# Patient Record
Sex: Male | Born: 1975 | Race: Black or African American | Hispanic: No | Marital: Single | Smoking: Never smoker
Health system: Southern US, Community
[De-identification: ages and names within clinical notes are randomized; demographics above are authoritative.]

## PROBLEM LIST (undated history)

## (undated) DIAGNOSIS — E119 Type 2 diabetes mellitus without complications: Secondary | ICD-10-CM

## (undated) DIAGNOSIS — E785 Hyperlipidemia, unspecified: Secondary | ICD-10-CM

## (undated) DIAGNOSIS — N289 Disorder of kidney and ureter, unspecified: Secondary | ICD-10-CM

## (undated) DIAGNOSIS — I10 Essential (primary) hypertension: Secondary | ICD-10-CM

---

## 2019-04-01 ENCOUNTER — Emergency Department: Payer: Self-pay

## 2019-04-01 ENCOUNTER — Inpatient Hospital Stay
Admission: EM | Admit: 2019-04-01 | Discharge: 2019-04-15 | DRG: 870 | Disposition: A | Discharge status: Home Health | Payer: Self-pay | Attending: Internal Medicine | Admitting: Internal Medicine

## 2019-04-01 ENCOUNTER — Inpatient Hospital Stay: Payer: Self-pay

## 2019-04-01 ENCOUNTER — Other Ambulatory Visit: Payer: Self-pay

## 2019-04-01 ENCOUNTER — Other Ambulatory Visit: Payer: Medicaid Other

## 2019-04-01 DIAGNOSIS — E1110 Type 2 diabetes mellitus with ketoacidosis without coma: Secondary | ICD-10-CM | POA: Diagnosis present

## 2019-04-01 DIAGNOSIS — E1122 Type 2 diabetes mellitus with diabetic chronic kidney disease: Secondary | ICD-10-CM | POA: Diagnosis present

## 2019-04-01 DIAGNOSIS — J189 Pneumonia, unspecified organism: Secondary | ICD-10-CM

## 2019-04-01 DIAGNOSIS — Z7984 Long term (current) use of oral hypoglycemic drugs: Secondary | ICD-10-CM

## 2019-04-01 DIAGNOSIS — J9602 Acute respiratory failure with hypercapnia: Secondary | ICD-10-CM | POA: Diagnosis present

## 2019-04-01 DIAGNOSIS — I120 Hypertensive chronic kidney disease with stage 5 chronic kidney disease or end stage renal disease: Secondary | ICD-10-CM | POA: Diagnosis present

## 2019-04-01 DIAGNOSIS — Z79899 Other long term (current) drug therapy: Secondary | ICD-10-CM

## 2019-04-01 DIAGNOSIS — Z992 Dependence on renal dialysis: Secondary | ICD-10-CM

## 2019-04-01 DIAGNOSIS — R739 Hyperglycemia, unspecified: Secondary | ICD-10-CM

## 2019-04-01 DIAGNOSIS — N179 Acute kidney failure, unspecified: Secondary | ICD-10-CM

## 2019-04-01 DIAGNOSIS — J690 Pneumonitis due to inhalation of food and vomit: Secondary | ICD-10-CM

## 2019-04-01 DIAGNOSIS — Z7982 Long term (current) use of aspirin: Secondary | ICD-10-CM

## 2019-04-01 DIAGNOSIS — T80212A Local infection due to central venous catheter, initial encounter: Secondary | ICD-10-CM

## 2019-04-01 DIAGNOSIS — J9600 Acute respiratory failure, unspecified whether with hypoxia or hypercapnia: Secondary | ICD-10-CM

## 2019-04-01 DIAGNOSIS — E871 Hypo-osmolality and hyponatremia: Secondary | ICD-10-CM | POA: Diagnosis present

## 2019-04-01 DIAGNOSIS — E869 Volume depletion, unspecified: Secondary | ICD-10-CM | POA: Diagnosis present

## 2019-04-01 DIAGNOSIS — K5900 Constipation, unspecified: Secondary | ICD-10-CM | POA: Diagnosis present

## 2019-04-01 DIAGNOSIS — Z1159 Encounter for screening for other viral diseases: Secondary | ICD-10-CM

## 2019-04-01 DIAGNOSIS — N186 End stage renal disease: Secondary | ICD-10-CM

## 2019-04-01 DIAGNOSIS — R570 Cardiogenic shock: Secondary | ICD-10-CM | POA: Diagnosis present

## 2019-04-01 DIAGNOSIS — B9562 Methicillin resistant Staphylococcus aureus infection as the cause of diseases classified elsewhere: Secondary | ICD-10-CM | POA: Diagnosis present

## 2019-04-01 DIAGNOSIS — R4189 Other symptoms and signs involving cognitive functions and awareness: Secondary | ICD-10-CM

## 2019-04-01 DIAGNOSIS — E785 Hyperlipidemia, unspecified: Secondary | ICD-10-CM | POA: Diagnosis present

## 2019-04-01 DIAGNOSIS — Z9911 Dependence on respirator [ventilator] status: Secondary | ICD-10-CM

## 2019-04-01 DIAGNOSIS — R6521 Severe sepsis with septic shock: Secondary | ICD-10-CM | POA: Diagnosis present

## 2019-04-01 DIAGNOSIS — A419 Sepsis, unspecified organism: Principal | ICD-10-CM

## 2019-04-01 DIAGNOSIS — Z905 Acquired absence of kidney: Secondary | ICD-10-CM

## 2019-04-01 DIAGNOSIS — N170 Acute kidney failure with tubular necrosis: Secondary | ICD-10-CM | POA: Diagnosis present

## 2019-04-01 DIAGNOSIS — E1111 Type 2 diabetes mellitus with ketoacidosis with coma: Secondary | ICD-10-CM

## 2019-04-01 DIAGNOSIS — R52 Pain, unspecified: Secondary | ICD-10-CM

## 2019-04-01 DIAGNOSIS — Z4659 Encounter for fitting and adjustment of other gastrointestinal appliance and device: Secondary | ICD-10-CM

## 2019-04-01 DIAGNOSIS — J9601 Acute respiratory failure with hypoxia: Secondary | ICD-10-CM

## 2019-04-01 DIAGNOSIS — J45909 Unspecified asthma, uncomplicated: Secondary | ICD-10-CM | POA: Diagnosis present

## 2019-04-01 DIAGNOSIS — G9341 Metabolic encephalopathy: Secondary | ICD-10-CM | POA: Diagnosis present

## 2019-04-01 DIAGNOSIS — Z6841 Body Mass Index (BMI) 40.0 and over, adult: Secondary | ICD-10-CM

## 2019-04-01 DIAGNOSIS — D631 Anemia in chronic kidney disease: Secondary | ICD-10-CM | POA: Diagnosis present

## 2019-04-01 DIAGNOSIS — I248 Other forms of acute ischemic heart disease: Secondary | ICD-10-CM | POA: Diagnosis present

## 2019-04-01 DIAGNOSIS — Z111 Encounter for screening for respiratory tuberculosis: Secondary | ICD-10-CM

## 2019-04-01 DIAGNOSIS — R569 Unspecified convulsions: Secondary | ICD-10-CM

## 2019-04-01 DIAGNOSIS — E8770 Fluid overload, unspecified: Secondary | ICD-10-CM | POA: Diagnosis present

## 2019-04-01 HISTORY — DX: Type 2 diabetes mellitus without complications: E11.9

## 2019-04-01 HISTORY — DX: Essential (primary) hypertension: I10

## 2019-04-01 HISTORY — DX: Hyperlipidemia, unspecified: E78.5

## 2019-04-01 LAB — COMPREHENSIVE METABOLIC PANEL
ALT: 33 U/L (ref 0–44)
AST: 66 U/L — ABNORMAL HIGH (ref 15–41)
Albumin: 4.6 g/dL (ref 3.5–5.0)
Alkaline Phosphatase: 174 U/L — ABNORMAL HIGH (ref 38–126)
Anion gap: 30 — ABNORMAL HIGH (ref 5–15)
BUN: 23 mg/dL — ABNORMAL HIGH (ref 6–20)
CO2: 9 mmol/L — ABNORMAL LOW (ref 22–32)
Calcium: 9 mg/dL (ref 8.9–10.3)
Chloride: 94 mmol/L — ABNORMAL LOW (ref 98–111)
Creatinine, Ser: 2.3 mg/dL — ABNORMAL HIGH (ref 0.61–1.24)
GFR calc Af Amer: 39 mL/min — ABNORMAL LOW (ref >60)
GFR calc non Af Amer: 34 mL/min — ABNORMAL LOW (ref >60)
Glucose, Bld: 739 mg/dL (ref 70–99)
Potassium: 5 mmol/L (ref 3.5–5.1)
Sodium: 133 mmol/L — ABNORMAL LOW (ref 135–145)
Total Bilirubin: 0.8 mg/dL (ref 0.3–1.2)
Total Protein: 8.2 g/dL — ABNORMAL HIGH (ref 6.5–8.1)

## 2019-04-01 LAB — BLOOD GAS, ARTERIAL
Acid-base deficit: 15.9 mmol/L — ABNORMAL HIGH (ref 0.0–2.0)
Acid-base deficit: 12.6 mmol/L — ABNORMAL HIGH (ref 0.0–2.0)
Acid-base deficit: 5.5 mmol/L — ABNORMAL HIGH (ref 0.0–2.0)
Allens test (pass/fail): POSITIVE — AB
Bicarbonate: 16 mmol/L — ABNORMAL LOW (ref 20.0–28.0)
Bicarbonate: 14.9 mmol/L — ABNORMAL LOW (ref 20.0–28.0)
Bicarbonate: 22.3 mmol/L (ref 20.0–28.0)
FIO2: 1
FIO2: 0.75
FIO2: 0.6
MECHVT: 500 mL
MECHVT: 500 mL
MECHVT: 500 mL
Mechanical Rate: 18
O2 Saturation: 97.7 %
O2 Saturation: 99.2 %
O2 Saturation: 99.1 %
PEEP: 5 cmH2O
PEEP: 8 cmH2O
PEEP: 8 cmH2O
Patient temperature: 37
Patient temperature: 37
Patient temperature: 37
RATE: 18 resp/min
RATE: 18 resp/min
pCO2 arterial: 65 mmHg — ABNORMAL HIGH (ref 32.0–48.0)
pCO2 arterial: 39 mmHg (ref 32.0–48.0)
pCO2 arterial: 52 mmHg — ABNORMAL HIGH (ref 32.0–48.0)
pH, Arterial: 7 — CL (ref 7.350–7.450)
pH, Arterial: 7.19 — CL (ref 7.350–7.450)
pH, Arterial: 7.24 — ABNORMAL LOW (ref 7.350–7.450)
pO2, Arterial: 142 mmHg — ABNORMAL HIGH (ref 83.0–108.0)
pO2, Arterial: 171 mmHg — ABNORMAL HIGH (ref 83.0–108.0)
pO2, Arterial: 159 mmHg — ABNORMAL HIGH (ref 83.0–108.0)

## 2019-04-01 LAB — BASIC METABOLIC PANEL
Anion gap: 16 — ABNORMAL HIGH (ref 5–15)
Anion gap: 13 (ref 5–15)
BUN: 24 mg/dL — ABNORMAL HIGH (ref 6–20)
BUN: 24 mg/dL — ABNORMAL HIGH (ref 6–20)
CO2: 19 mmol/L — ABNORMAL LOW (ref 22–32)
CO2: 22 mmol/L (ref 22–32)
Calcium: 8.4 mg/dL — ABNORMAL LOW (ref 8.9–10.3)
Calcium: 8.4 mg/dL — ABNORMAL LOW (ref 8.9–10.3)
Chloride: 101 mmol/L (ref 98–111)
Chloride: 104 mmol/L (ref 98–111)
Creatinine, Ser: 2.54 mg/dL — ABNORMAL HIGH (ref 0.61–1.24)
Creatinine, Ser: 3.16 mg/dL — ABNORMAL HIGH (ref 0.61–1.24)
GFR calc Af Amer: 35 mL/min — ABNORMAL LOW (ref >60)
GFR calc Af Amer: 27 mL/min — ABNORMAL LOW (ref >60)
GFR calc non Af Amer: 30 mL/min — ABNORMAL LOW (ref >60)
GFR calc non Af Amer: 23 mL/min — ABNORMAL LOW (ref >60)
Glucose, Bld: 583 mg/dL (ref 70–99)
Glucose, Bld: 240 mg/dL — ABNORMAL HIGH (ref 70–99)
Potassium: 5.1 mmol/L (ref 3.5–5.1)
Potassium: 4.3 mmol/L (ref 3.5–5.1)
Sodium: 136 mmol/L (ref 135–145)
Sodium: 139 mmol/L (ref 135–145)

## 2019-04-01 LAB — MAGNESIUM: Magnesium: 3.4 mg/dL — ABNORMAL HIGH (ref 1.7–2.4)

## 2019-04-01 LAB — TROPONIN I
Troponin I: 0.06 ng/mL (ref <0.03)
Troponin I: 0.36 ng/mL (ref <0.03)
Troponin I: 0.72 ng/mL (ref <0.03)
Troponin I: 0.93 ng/mL (ref <0.03)

## 2019-04-01 LAB — GLUCOSE, CAPILLARY
Glucose-Capillary: >600 mg/dL (ref 70–99)
Glucose-Capillary: 566 mg/dL (ref 70–99)
Glucose-Capillary: >600 mg/dL (ref 70–99)
Glucose-Capillary: 459 mg/dL — ABNORMAL HIGH (ref 70–99)
Glucose-Capillary: 413 mg/dL — ABNORMAL HIGH (ref 70–99)
Glucose-Capillary: 346 mg/dL — ABNORMAL HIGH (ref 70–99)
Glucose-Capillary: 223 mg/dL — ABNORMAL HIGH (ref 70–99)
Glucose-Capillary: 195 mg/dL — ABNORMAL HIGH (ref 70–99)
Glucose-Capillary: 170 mg/dL — ABNORMAL HIGH (ref 70–99)
Glucose-Capillary: 141 mg/dL — ABNORMAL HIGH (ref 70–99)
Glucose-Capillary: 132 mg/dL — ABNORMAL HIGH (ref 70–99)
Glucose-Capillary: 197 mg/dL — ABNORMAL HIGH (ref 70–99)

## 2019-04-01 LAB — CBC WITH DIFFERENTIAL/PLATELET
Abs Immature Granulocytes: 1.3 10*3/uL — ABNORMAL HIGH (ref 0.00–0.07)
Basophils Absolute: 0.3 10*3/uL — ABNORMAL HIGH (ref 0.0–0.1)
Basophils Relative: 1 %
Eosinophils Absolute: 1.4 10*3/uL — ABNORMAL HIGH (ref 0.0–0.5)
Eosinophils Relative: 5 %
HCT: 47.2 % (ref 39.0–52.0)
Hemoglobin: 13.7 g/dL (ref 13.0–17.0)
Immature Granulocytes: 5 %
Lymphocytes Relative: 31 %
Lymphs Abs: 8 10*3/uL — ABNORMAL HIGH (ref 0.7–4.0)
MCH: 30.4 pg (ref 26.0–34.0)
MCHC: 29 g/dL — ABNORMAL LOW (ref 30.0–36.0)
MCV: 104.7 fL — ABNORMAL HIGH (ref 80.0–100.0)
Monocytes Absolute: 1.1 10*3/uL — ABNORMAL HIGH (ref 0.1–1.0)
Monocytes Relative: 4 %
Neutro Abs: 14.1 10*3/uL — ABNORMAL HIGH (ref 1.7–7.7)
Neutrophils Relative %: 54 %
Platelets: 414 10*3/uL — ABNORMAL HIGH (ref 150–400)
RBC: 4.51 MIL/uL (ref 4.22–5.81)
RDW: 13.2 % (ref 11.5–15.5)
Smear Review: NORMAL
WBC: 26.2 10*3/uL — ABNORMAL HIGH (ref 4.0–10.5)
nRBC: 2 % — ABNORMAL HIGH (ref 0.0–0.2)

## 2019-04-01 LAB — URINALYSIS, COMPLETE (UACMP) WITH MICROSCOPIC
Bacteria, UA: NONE SEEN
Bilirubin Urine: NEGATIVE
Glucose, UA: >=500 mg/dL — AB
Ketones, ur: NEGATIVE mg/dL
Leukocytes,Ua: NEGATIVE
Nitrite: NEGATIVE
Protein, ur: 100 mg/dL — AB
Specific Gravity, Urine: 1.018 (ref 1.005–1.030)
pH: 5 (ref 5.0–8.0)

## 2019-04-01 LAB — LACTIC ACID, PLASMA
Lactic Acid, Venous: >11 mmol/L (ref 0.5–1.9)
Lactic Acid, Venous: 10.5 mmol/L (ref 0.5–1.9)
Lactic Acid, Venous: 7.2 mmol/L (ref 0.5–1.9)

## 2019-04-01 LAB — URINE DRUG SCREEN, QUALITATIVE (ARMC ONLY)
Amphetamines, Ur Screen: NOT DETECTED
Barbiturates, Ur Screen: NOT DETECTED
Benzodiazepine, Ur Scrn: NOT DETECTED
Cannabinoid 50 Ng, Ur ~~LOC~~: NOT DETECTED
Cocaine Metabolite,Ur ~~LOC~~: NOT DETECTED
MDMA (Ecstasy)Ur Screen: NOT DETECTED
Methadone Scn, Ur: NOT DETECTED
Opiate, Ur Screen: NOT DETECTED
Phencyclidine (PCP) Ur S: NOT DETECTED
Tricyclic, Ur Screen: NOT DETECTED

## 2019-04-01 LAB — ACETAMINOPHEN LEVEL: Acetaminophen (Tylenol), Serum: <10 ug/mL — ABNORMAL LOW (ref 10–30)

## 2019-04-01 LAB — HEMOGLOBIN A1C
Hgb A1c MFr Bld: 14.8 % — ABNORMAL HIGH (ref 4.8–5.6)
Mean Plasma Glucose: 378.06 mg/dL

## 2019-04-01 LAB — SARS CORONAVIRUS 2 BY RT PCR (HOSPITAL ORDER, PERFORMED IN ~~LOC~~ HOSPITAL LAB): SARS Coronavirus 2: NEGATIVE

## 2019-04-01 LAB — ETHANOL: Alcohol, Ethyl (B): <10 mg/dL (ref <10)

## 2019-04-01 LAB — MRSA PCR SCREENING: MRSA by PCR: NEGATIVE

## 2019-04-01 LAB — LIPASE, BLOOD: Lipase: 40 U/L (ref 11–51)

## 2019-04-01 LAB — PROCALCITONIN: Procalcitonin: 20.31 ng/mL

## 2019-04-01 LAB — SALICYLATE LEVEL: Salicylate Lvl: <7 mg/dL (ref 2.8–30.0)

## 2019-04-01 LAB — BETA-HYDROXYBUTYRIC ACID: Beta-Hydroxybutyric Acid: 1.35 mmol/L — ABNORMAL HIGH (ref 0.05–0.27)

## 2019-04-01 MED ORDER — SODIUM CHLORIDE 0.9 % IV BOLUS (SEPSIS)
1000.0000 mL | Freq: Once | INTRAVENOUS | Status: AC
  Administered 2019-04-01: 1000 mL via INTRAVENOUS

## 2019-04-01 MED ORDER — FENTANYL 2500MCG IN NS 250ML (10MCG/ML) PREMIX INFUSION
0.0000 ug/h | INTRAVENOUS | Status: DC
  Administered 2019-04-01: 150 ug/h via INTRAVENOUS
  Administered 2019-04-01: 25 ug/h via INTRAVENOUS
  Administered 2019-04-02: 13:00:00 175 ug/h via INTRAVENOUS
  Administered 2019-04-03: 01:00:00 250 ug/h via INTRAVENOUS
  Administered 2019-04-03: 15:00:00 50 ug/h via INTRAVENOUS
  Administered 2019-04-04 (×2): 200 ug/h via INTRAVENOUS
  Administered 2019-04-05: 06:00:00 100 ug/h via INTRAVENOUS
  Filled 2019-04-01 (×8): qty 250

## 2019-04-01 MED ORDER — ACETAMINOPHEN 650 MG RE SUPP: 650.0000 mg | Freq: Four times a day (QID) | RECTAL | Status: DC | PRN

## 2019-04-01 MED ORDER — LACTATED RINGERS IV SOLN
INTRAVENOUS | Status: DC
  Administered 2019-04-01: 19:00:00 via INTRAVENOUS

## 2019-04-01 MED ORDER — DEXTROSE-NACL 5-0.45 % IV SOLN: INTRAVENOUS | Status: DC

## 2019-04-01 MED ORDER — VANCOMYCIN HCL 1.5 G IV SOLR
1500.0000 mg | Freq: Once | INTRAVENOUS | Status: AC
  Administered 2019-04-01: 1500 mg via INTRAVENOUS
  Filled 2019-04-01: qty 1500

## 2019-04-01 MED ORDER — INSULIN REGULAR(HUMAN) IN NACL 100-0.9 UT/100ML-% IV SOLN
INTRAVENOUS | Status: DC
  Administered 2019-04-01: 6.8 [IU]/h via INTRAVENOUS
  Administered 2019-04-01: 5.4 [IU]/h via INTRAVENOUS
  Filled 2019-04-01 (×2): qty 100

## 2019-04-01 MED ORDER — SODIUM CHLORIDE 0.9% FLUSH
3.0000 mL | Freq: Two times a day (BID) | INTRAVENOUS | Status: DC
  Administered 2019-04-01 – 2019-04-15 (×25): 3 mL via INTRAVENOUS

## 2019-04-01 MED ORDER — FENTANYL CITRATE (PF) 100 MCG/2ML IJ SOLN
100.0000 ug | Freq: Once | INTRAMUSCULAR | Status: AC
  Administered 2019-04-01: 100 ug via INTRAVENOUS

## 2019-04-01 MED ORDER — SODIUM CHLORIDE 0.9 % IV SOLN
INTRAVENOUS | Status: DC
  Administered 2019-04-01: 13:00:00 via INTRAVENOUS

## 2019-04-01 MED ORDER — PANTOPRAZOLE SODIUM 40 MG IV SOLR
40.0000 mg | INTRAVENOUS | Status: DC
  Administered 2019-04-01 – 2019-04-03 (×3): 40 mg via INTRAVENOUS
  Filled 2019-04-01 (×3): qty 40

## 2019-04-01 MED ORDER — VECURONIUM BROMIDE 10 MG IV SOLR
10.0000 mg | Freq: Once | INTRAVENOUS | Status: AC
  Administered 2019-04-01: 10 mg via INTRAVENOUS
  Filled 2019-04-01: qty 10

## 2019-04-01 MED ORDER — PIPERACILLIN-TAZOBACTAM 3.375 G IVPB 30 MIN
3.3750 g | Freq: Once | INTRAVENOUS | Status: AC
  Administered 2019-04-01: 3.375 g via INTRAVENOUS
  Filled 2019-04-01: qty 50

## 2019-04-01 MED ORDER — PIPERACILLIN-TAZOBACTAM 3.375 G IVPB
3.3750 g | Freq: Three times a day (TID) | INTRAVENOUS | Status: DC
  Administered 2019-04-01 – 2019-04-04 (×9): 3.375 g via INTRAVENOUS
  Filled 2019-04-01 (×9): qty 50

## 2019-04-01 MED ORDER — LEVETIRACETAM IN NACL 1000 MG/100ML IV SOLN
1000.0000 mg | Freq: Once | INTRAVENOUS | Status: AC
  Administered 2019-04-01: 1000 mg via INTRAVENOUS
  Filled 2019-04-01: qty 100

## 2019-04-01 MED ORDER — POLYETHYLENE GLYCOL 3350 17 G PO PACK: 17.0000 g | PACK | Freq: Every day | ORAL | Status: DC | PRN

## 2019-04-01 MED ORDER — INSULIN GLARGINE 100 UNIT/ML ~~LOC~~ SOLN
20.0000 [IU] | Freq: Two times a day (BID) | SUBCUTANEOUS | Status: DC
  Administered 2019-04-01 – 2019-04-04 (×6): 20 [IU] via SUBCUTANEOUS
  Filled 2019-04-01 (×8): qty 0.2

## 2019-04-01 MED ORDER — ONDANSETRON HCL 4 MG PO TABS: 4.0000 mg | ORAL_TABLET | Freq: Four times a day (QID) | ORAL | Status: DC | PRN

## 2019-04-01 MED ORDER — SUCCINYLCHOLINE CHLORIDE 20 MG/ML IJ SOLN
200.0000 mg | Freq: Once | INTRAMUSCULAR | Status: AC
  Administered 2019-04-01: 06:00:00 200 mg via INTRAVENOUS

## 2019-04-01 MED ORDER — INSULIN GLARGINE 100 UNIT/ML ~~LOC~~ SOLN
40.0000 [IU] | Freq: Every day | SUBCUTANEOUS | Status: DC
  Filled 2019-04-01 (×2): qty 0.4

## 2019-04-01 MED ORDER — SODIUM CHLORIDE 0.9 % IV SOLN
2.0000 g | Freq: Two times a day (BID) | INTRAVENOUS | Status: DC
  Filled 2019-04-01 (×2): qty 2

## 2019-04-01 MED ORDER — ETOMIDATE 2 MG/ML IV SOLN
20.0000 mg | Freq: Once | INTRAVENOUS | Status: AC
  Administered 2019-04-01: 20 mg via INTRAVENOUS

## 2019-04-01 MED ORDER — ONDANSETRON HCL 4 MG/2ML IJ SOLN: 4.0000 mg | Freq: Four times a day (QID) | INTRAMUSCULAR | Status: DC | PRN

## 2019-04-01 MED ORDER — SODIUM CHLORIDE 0.9 % IV BOLUS
1000.0000 mL | Freq: Once | INTRAVENOUS | Status: AC
  Administered 2019-04-01: 1000 mL via INTRAVENOUS

## 2019-04-01 MED ORDER — PROPOFOL 1000 MG/100ML IV EMUL
5.0000 ug/kg/min | INTRAVENOUS | Status: DC
  Administered 2019-04-01: 5 ug/kg/min via INTRAVENOUS

## 2019-04-01 MED ORDER — INSULIN ASPART 100 UNIT/ML ~~LOC~~ SOLN
0.0000 [IU] | SUBCUTANEOUS | Status: DC
  Administered 2019-04-02 (×3): 5 [IU] via SUBCUTANEOUS
  Administered 2019-04-02: 7 [IU] via SUBCUTANEOUS
  Filled 2019-04-01 (×4): qty 1

## 2019-04-01 MED ORDER — MIDAZOLAM HCL 2 MG/2ML IJ SOLN
2.0000 mg | Freq: Once | INTRAMUSCULAR | Status: AC
  Administered 2019-04-01: 2 mg via INTRAVENOUS

## 2019-04-01 MED ORDER — SODIUM CHLORIDE 0.9 % IV SOLN
750.0000 mg | Freq: Two times a day (BID) | INTRAVENOUS | Status: DC
  Administered 2019-04-01 – 2019-04-03 (×4): 750 mg via INTRAVENOUS
  Filled 2019-04-01 (×5): qty 7.5

## 2019-04-01 MED ORDER — ACETAMINOPHEN 325 MG PO TABS
650.0000 mg | ORAL_TABLET | Freq: Four times a day (QID) | ORAL | Status: DC | PRN
  Administered 2019-04-05 – 2019-04-09 (×3): 650 mg via ORAL
  Filled 2019-04-01 (×3): qty 2

## 2019-04-01 MED ORDER — LIDOCAINE HCL (CARDIAC) PF 100 MG/5ML IV SOSY
PREFILLED_SYRINGE | INTRAVENOUS | Status: AC
  Administered 2019-04-01: 06:00:00
  Filled 2019-04-01: qty 5

## 2019-04-01 MED ORDER — PROPOFOL 1000 MG/100ML IV EMUL
INTRAVENOUS | Status: AC
  Filled 2019-04-01: qty 100

## 2019-04-01 MED ORDER — ALBUTEROL SULFATE (2.5 MG/3ML) 0.083% IN NEBU: 2.5000 mg | INHALATION_SOLUTION | RESPIRATORY_TRACT | Status: DC | PRN

## 2019-04-01 MED ORDER — VANCOMYCIN HCL 10 G IV SOLR: 1250.0000 mg | INTRAVENOUS | Status: DC

## 2019-04-01 MED ORDER — NOREPINEPHRINE BITARTRATE 1 MG/ML IV SOLN
0.0000 ug/min | INTRAVENOUS | Status: DC
  Administered 2019-04-01: 2 ug/min via INTRAVENOUS
  Filled 2019-04-01 (×3): qty 16

## 2019-04-01 MED ORDER — CLINDAMYCIN PHOSPHATE 600 MG/50ML IV SOLN
600.0000 mg | Freq: Once | INTRAVENOUS | Status: AC
  Administered 2019-04-01: 600 mg via INTRAVENOUS
  Filled 2019-04-01: qty 50

## 2019-04-01 MED ORDER — POTASSIUM CHLORIDE 10 MEQ/100ML IV SOLN
10.0000 meq | INTRAVENOUS | Status: DC
  Filled 2019-04-01 (×2): qty 100

## 2019-04-01 MED ORDER — LEVETIRACETAM IN NACL 500 MG/100ML IV SOLN
500.0000 mg | Freq: Two times a day (BID) | INTRAVENOUS | Status: DC
  Filled 2019-04-01: qty 100

## 2019-04-01 MED ORDER — PROPOFOL 1000 MG/100ML IV EMUL
5.0000 ug/kg/min | INTRAVENOUS | Status: DC
  Administered 2019-04-01: 25 ug/kg/min via INTRAVENOUS
  Administered 2019-04-01 (×2): 35 ug/kg/min via INTRAVENOUS
  Administered 2019-04-01: 07:00:00 5 ug/kg/min via INTRAVENOUS
  Administered 2019-04-02: 20 ug/kg/min via INTRAVENOUS
  Administered 2019-04-02: 60 ug/kg/min via INTRAVENOUS
  Administered 2019-04-02: 40 ug/kg/min via INTRAVENOUS
  Administered 2019-04-02: 22:00:00 30 ug/kg/min via INTRAVENOUS
  Administered 2019-04-02: 40 ug/kg/min via INTRAVENOUS
  Administered 2019-04-03: 08:00:00 50 ug/kg/min via INTRAVENOUS
  Administered 2019-04-03: 15 ug/kg/min via INTRAVENOUS
  Administered 2019-04-03: 02:00:00 30 ug/kg/min via INTRAVENOUS
  Administered 2019-04-03: 40 ug/kg/min via INTRAVENOUS
  Administered 2019-04-03 (×3): 50 ug/kg/min via INTRAVENOUS
  Administered 2019-04-04 (×3): 40 ug/kg/min via INTRAVENOUS
  Administered 2019-04-04: 02:00:00 60 ug/kg/min via INTRAVENOUS
  Filled 2019-04-01: qty 200
  Filled 2019-04-01 (×18): qty 100
  Filled 2019-04-01: qty 200

## 2019-04-01 MED ORDER — FENTANYL CITRATE (PF) 100 MCG/2ML IJ SOLN
250.0000 ug | Freq: Once | INTRAMUSCULAR | Status: AC
  Administered 2019-04-01: 250 ug via INTRAVENOUS

## 2019-04-01 MED ORDER — VANCOMYCIN HCL IN DEXTROSE 1-5 GM/200ML-% IV SOLN
1000.0000 mg | Freq: Once | INTRAVENOUS | Status: AC
  Administered 2019-04-01: 1000 mg via INTRAVENOUS
  Filled 2019-04-01: qty 200

## 2019-04-01 NOTE — ED Notes (Signed)
This RN, Edison Nasuti, EDT, and Hez, RT transporting pt to ICU at this time.

## 2019-04-01 NOTE — ED Triage Notes (Signed)
Patient arrived from home by [facility] EMS unresponsive. EMS gave nasal narcan without results. Per roommate he believes patient had seizure. Patient arrived to ED unresponsives.

## 2019-04-01 NOTE — ED Notes (Addendum)
Dr. Ellender Hose at bedside to insert central line.

## 2019-04-01 NOTE — ED Notes (Signed)
Pt's mother unable to provide hx of patient, reports that patient has not been in contact with the family for years.

## 2019-04-01 NOTE — ED Notes (Signed)
UNable to assess due to patient being unresponsive

## 2019-04-01 NOTE — Procedures (Signed)
History: 43 year old male being evaluated for possible seizure activity  Sedation: None  Technique: This is a 21 channel routine scalp EEG performed at the bedside with bipolar and monopolar montages arranged in accordance to the international 10/20 system of electrode placement. One channel was dedicated to EKG recording.    Background: The background consists of moderate voltage diffuse long runs of theta and alpha activity with intervening periods of of attenuation.  No epileptiform discharges were seen.  Photic stimulation: Physiologic driving is not performed  EEG Abnormalities: 1) discontinuous EEG  Clinical Interpretation: This EEG is consistent with stream dysfunction as can be commonly seen with propofol sedation. There was no seizure or seizure predisposition recorded on this study. Please note that lack of epileptiform activity on EEG does not preclude the possibility of epilepsy.   Roland Rack, MD [facility] [phone]  If 7pm- 7am, please page neurology on call as listed in [place].

## 2019-04-01 NOTE — ED Notes (Signed)
Time out performed by this RN and Dr. Ellender Hose, central line insertion performed by Dr. Ellender Hose to the R IJ, pt verified by name, birthday, and wrist band as pt condition prevents him from being able to verify information.

## 2019-04-01 NOTE — ED Notes (Signed)
Per Dr. Myrene Buddy, okay to use central line to R IJ after being verified with X-ray, blood return noted to proximal, distal, and medial lines.

## 2019-04-01 NOTE — Consult Note (Signed)
Reason for Consult:seizure activity  Referring Physician: Dr. Mortimer Fries   CC: seizure.   HPI: Roy Oconnell is an 43 y.o. male known history of DM, HTN, HL brought in by EMS after his roommate saw patient having seizures.  Patient was given Narcan with no response.  Here his urine drug screen is negative.  Patient intubated on arrival to emergency room. Started on propofol and fentanyl.  Labs showed acute kidney injury, DKA, lactic acid greater than 11 and WBC count of 25,000History reviewed. No pertinent past medical history.  History reviewed. No pertinent surgical history.  No family history on file.  Social History:  has no history on file for tobacco, alcohol, and drug.  Not on File  Medications: I have reviewed the patient's current medications.  ROS: Unable to obtain as intubated   Physical Examination: Blood pressure 139/75, pulse (!) 114, temperature (!) 97.5 F (36.4 C), resp. rate 20, height 5\' 10"  (1.778 m), weight 127 kg, SpO2 93 %.  Intubated on propofol and fentanyl When off sedation pt does move all his extremities and tries to reach for the tube corneals and pupils present.   Laboratory Studies:   Basic Metabolic Panel: Recent Labs  Lab 04/01/19 0530  NA 133*  K 5.0  CL 94*  CO2 9*  GLUCOSE 739*  BUN 23*  CREATININE 2.30*  CALCIUM 9.0    Liver Function Tests: Recent Labs  Lab 04/01/19 0530  AST 66*  ALT 33  ALKPHOS 174*  BILITOT 0.8  PROT 8.2*  ALBUMIN 4.6   Recent Labs  Lab 04/01/19 0530  LIPASE 40   No results for input(s): AMMONIA in the last 168 hours.  CBC: Recent Labs  Lab 04/01/19 0530  WBC 26.2*  NEUTROABS 14.1*  HGB 13.7  HCT 47.2  MCV 104.7*  PLT 414*    Cardiac Enzymes: Recent Labs  Lab 04/01/19 0530  TROPONINI 0.06*    BNP: Invalid input(s): POCBNP  CBG: Recent Labs  Lab 04/01/19 0900 04/01/19 1011 04/01/19 1057  GLUCAP >600* 566* >600*    Microbiology: Results for orders placed or performed  during the hospital encounter of 04/01/19  SARS Coronavirus 2 (CEPHEID - Performed in [place] hospital lab), Hosp Order     Status: None   Collection Time: 04/01/19  5:30 AM   Specimen: Nasopharyngeal Swab  Result Value Ref Range Status   SARS Coronavirus 2 NEGATIVE NEGATIVE Final    Comment: (NOTE) If result is NEGATIVE SARS-CoV-2 target nucleic acids are NOT DETECTED. The SARS-CoV-2 RNA is generally detectable in upper and lower  respiratory specimens during the acute phase of infection. The lowest  concentration of SARS-CoV-2 viral copies this assay can detect is 250  copies / mL. A negative result does not preclude SARS-CoV-2 infection  and should not be used as the sole basis for treatment or other  patient management decisions.  A negative result may occur with  improper specimen collection / handling, submission of specimen other  than nasopharyngeal swab, presence of viral mutation(s) within the  areas targeted by this assay, and inadequate number of viral copies  (<250 copies / mL). A negative result must be combined with clinical  observations, patient history, and epidemiological information. If result is POSITIVE SARS-CoV-2 target nucleic acids are DETECTED. The SARS-CoV-2 RNA is generally detectable in upper and lower  respiratory specimens dur ing the acute phase of infection.  Positive  results are indicative of active infection with SARS-CoV-2.  Clinical  correlation with  patient history and other diagnostic information is  necessary to determine patient infection status.  Positive results do  not rule out bacterial infection or co-infection with other viruses. If result is PRESUMPTIVE POSTIVE SARS-CoV-2 nucleic acids MAY BE PRESENT.   A presumptive positive result was obtained on the submitted specimen  and confirmed on repeat testing.  While 2019 novel coronavirus  (SARS-CoV-2) nucleic acids may be present in the submitted sample  additional confirmatory  testing may be necessary for epidemiological  and / or clinical management purposes  to differentiate between  SARS-CoV-2 and other Sarbecovirus currently known to infect humans.  If clinically indicated additional testing with an alternate test  methodology [phone]) is advised. The SARS-CoV-2 RNA is generally  detectable in upper and lower respiratory sp ecimens during the acute  phase of infection. The expected result is Negative. Fact Sheet for Patients:  StrictlyIdeas.no Fact Sheet for Healthcare Providers: BankingDealers.co.za This test is not yet approved or cleared by the [place] FDA and has been authorized for detection and/or diagnosis of SARS-CoV-2 by FDA under an Emergency Use Authorization (EUA).  This EUA will remain in effect (meaning this test can be used) for the duration of the COVID-19 declaration under Section 564(b)(1) of the Act, 21 U.S.C. section 360bbb-3(b)(1), unless the authorization is terminated or revoked sooner. Performed at [facility], [address] [place], [place] [address]     Coagulation Studies: No results for input(s): LABPROT, INR in the last 72 hours.  Urinalysis:  Recent Labs  Lab 04/01/19 0530  COLORURINE YELLOW*  LABSPEC 1.018  PHURINE 5.0  GLUCOSEU >=500*  HGBUR MODERATE*  BILIRUBINUR NEGATIVE  KETONESUR NEGATIVE  PROTEINUR 100*  NITRITE NEGATIVE  LEUKOCYTESUR NEGATIVE    Lipid Panel:  No results found for: CHOL, TRIG, HDL, CHOLHDL, VLDL, LDLCALC  HgbA1C: No results found for: HGBA1C  Urine Drug Screen:      Component Value Date/Time   LABOPIA NONE DETECTED 04/01/2019 0530   COCAINSCRNUR NONE DETECTED 04/01/2019 0530   LABBENZ NONE DETECTED 04/01/2019 0530   AMPHETMU NONE DETECTED 04/01/2019 0530   THCU NONE DETECTED 04/01/2019 0530   LABBARB NONE DETECTED 04/01/2019 0530    Alcohol Level:  Recent Labs  Lab 04/01/19 0530  ETH <10    Other  results: EKG: normal EKG, normal sinus rhythm, unchanged from previous tracings.  Imaging: Dg Chest 1 View  Result Date: 04/01/2019 CLINICAL DATA:  New right-sided central line placement. EXAM: CHEST  1 VIEW COMPARISON:  None. FINDINGS: The heart size is exaggerated by low lung volumes. Endotracheal tube terminates 4 cm above the carina. Right IJ line is new. There is no pneumothorax. Lung volumes are low. Left greater than right basilar airspace opacities are present. Moderate pulmonary vascular congestion is again noted. IMPRESSION: 1. New right IJ line without radiographic evidence for complication. 2. Low lung volumes and moderate pulmonary vascular congestion. 3. Left greater than right basilar opacities again noted. Electronically Signed   By: San Morelle M.D.   On: 04/01/2019 08:53   Dg Chest 1 View  Result Date: 04/01/2019 CLINICAL DATA:  Unresponsive. EXAM: CHEST  1 VIEW COMPARISON:  None. FINDINGS: The patient is intubated. Endotracheal tube terminates 3.4 cm above the carina. Side port of the NG tube is beyond the GE junction. The heart is enlarged. This is exaggerated by low lung volumes. Perihilar prominence is noted bilaterally. Minimal basilar airspace opacities likely reflect atelectasis. IMPRESSION: 1. Low lung volumes. 2. Cardiomegaly without failure. 3. Moderate perihilar  prominence. This may be due to pulmonary vascular congestion and low lung volumes. Adenopathy or mass lesion is not excluded. 4. Bibasilar airspace opacities likely reflect atelectasis. Electronically Signed   By: San Morelle M.D.   On: 04/01/2019 06:05   Ct Head Wo Contrast  Result Date: 04/01/2019 CLINICAL DATA:  Unresponsive.  Possible seizures. EXAM: CT HEAD WITHOUT CONTRAST TECHNIQUE: Contiguous axial images were obtained from the base of the skull through the vertex without intravenous contrast. COMPARISON:  None. FINDINGS: Brain: No acute infarct, hemorrhage, or mass lesion is present. No  significant white matter lesions are present. The ventricles are of normal size. No significant extraaxial fluid collection is present. The brainstem and cerebellum are within normal limits. Vascular: No hyperdense vessel or unexpected calcification. Skull: Calvarium is intact. No focal lytic or blastic lesions are present. Remote right-sided nasal bone fracture is noted. Sinuses/Orbits: The paranasal sinuses and mastoid air cells are clear. The globes and orbits are within normal limits. IMPRESSION: 1. Normal CT appearance the brain. Electronically Signed   By: San Morelle M.D.   On: 04/01/2019 06:18     Assessment/Plan:   43 y.o. male known history of DM, HTN, HL brought in by EMS after his roommate saw patient having seizures.  Patient was given Narcan with no response.  Here his urine drug screen is negative.  Patient intubated on arrival to emergency room. Started on propofol and fentanyl.  Labs showed acute kidney injury, DKA, lactic acid greater than 11 and WBC count of 25,000  - Likely provoked seizure given the DKA and likely aspiration - CTH no acute abnormalities - Keppra 1gm given as load - Keppra 750 BID now for acute seizure prophylaxis for 3-5 days as likely provoked seizure - EEG today to make sure not in non convulsive status - Will follow  04/01/2019, 12:22 PM

## 2019-04-01 NOTE — Progress Notes (Signed)
CODE SEPSIS - PHARMACY COMMUNICATION  **Broad Spectrum Antibiotics should be administered within 1 hour of Sepsis diagnosis**  Time Code Sepsis Called/Page Received: @ [phone]  Antibiotics Ordered: clindamycin and zosyn   Time of 1st antibiotic administration: 0718  Additional action taken by pharmacy: N/A  If necessary, Name of Provider/Nurse Contacted: [place], PharmD, BCPS Clinical Pharmacist 04/01/2019 7:23 AM

## 2019-04-01 NOTE — Progress Notes (Signed)
eeg completed ° °

## 2019-04-01 NOTE — ED Notes (Signed)
Date and time results received: 04/01/19 0915 (use smartphrase ".now" to insert current time)  Test: Lactic Critical Value: 10.5  Name of Provider Notified: Dr. Darvin Neighbours  Orders Received? Or Actions Taken?: Critical Results acknowledged, continue to give IV boluses

## 2019-04-01 NOTE — H&P (Signed)
[place] [facility] [place] NAME: Roy Oconnell    MR#:  [id number]  DATE OF BIRTH:  August 03, 1976  DATE OF ADMISSION:  04/01/2019  PRIMARY CARE PHYSICIAN: Gennaro Africa, MD   REQUESTING/REFERRING PHYSICIAN: Dr. Beather Arbour  CHIEF COMPLAINT:   Chief Complaint  Patient presents with  . Seizures  . Loss of Consciousness    HISTORY OF PRESENT ILLNESS:  Roy Oconnell  is a 43 y.o. male with a known history of DM, HTN, HL brought in by EMS after his roommate saw patient having seizures.  Patient was given Narcan with no response.  Here his urine drug screen is negative.  Patient intubated on arrival to emergency room.  Has 2 peripheral IVs.  Started on propofol and fentanyl.  Labs showed acute kidney injury, DKA, lactic acid greater than 11 and WBC count of 25,000.  No source of infection found.  Patient given IV antibiotics.  Insulin not started yet while we are waiting for central line placement. CT scan of the head negative.  No prior history of seizures.  PAST MEDICAL HISTORY:  History reviewed. No pertinent past medical history.  Hypertension, diabetes mellitus, hyperlipidemia, morbid obesity PAST SURGICAL HISTORY:  History reviewed. No pertinent surgical history.  SOCIAL HISTORY:   Social History   Tobacco Use  . Smoking status: Unknown If Ever Smoked  Substance Use Topics  . Alcohol use: Not on file    Comment: unable to review    FAMILY HISTORY:  No family history on file.  DRUG ALLERGIES:  Not on File  REVIEW OF SYSTEMS:   Review of Systems  Unable to perform ROS: Intubated    MEDICATIONS AT HOME:   Prior to Admission medications   Medication Sig Start Date End Date Taking? Authorizing Provider  amLODipine (NORVASC) 10 MG tablet Take 10 mg by mouth daily. 12/29/17  Yes [provider]  aspirin EC 81 MG tablet Take 81 mg by mouth daily. 08/27/18 05/05/32 Yes [provider]  atorvastatin (LIPITOR) 20 MG tablet  Take 20 mg by mouth daily. 12/29/17  Yes [provider]  chlorthalidone (HYGROTON) 25 MG tablet Take 25 mg by mouth daily. 12/29/17  Yes [provider]  glipiZIDE (GLUCOTROL) 10 MG tablet Take 10 mg by mouth 2 (two) times a day. 08/27/18 08/27/19 Yes [provider]  lisinopril (ZESTRIL) 40 MG tablet Take 40 mg by mouth daily. 12/29/17  Yes [provider]  metoprolol succinate (TOPROL-XL) 50 MG 24 hr tablet Take 50 mg by mouth daily. 12/29/17  Yes [provider]  spironolactone (ALDACTONE) 25 MG tablet Take 25 mg by mouth daily. 08/27/18 08/27/19 Yes [provider]     VITAL SIGNS:  Blood pressure (!) 101/52, pulse (!) 104, temperature 97.7 F (36.5 C), resp. rate (!) 21, height 5\' 10"  (1.778 m), weight 127 kg, SpO2 97 %.  PHYSICAL EXAMINATION:  Physical Exam  GENERAL:  43 y.o.-year-old patient lying in the bed, obese EYES: Pupils equal, round, reactive to light and accommodation. No scleral icterus. Extraocular muscles intact.  HEENT: Head atraumatic, normocephalic. NGT and OGT. Blood around mouth NECK:  Supple, no jugular venous distention. No thyroid enlargement, no tenderness.  LUNGS: Normal breath sounds bilaterally, no wheezing, rales, rhonchi. No use of accessory muscles of respiration.  CARDIOVASCULAR: S1, S2 normal. No murmurs, rubs, or gallops.  ABDOMEN: Soft, nontender, nondistended. Bowel sounds present. No organomegaly or mass.  EXTREMITIES: No pedal edema, cyanosis, or clubbing. + 2  pedal & radial pulses b/l.   NEUROLOGIC: No rigidity or flaccidity PSYCHIATRIC: The patient is sedated SKIN: No obvious rash, lesion, or ulcer.   LABORATORY PANEL:   CBC Recent Labs  Lab 04/01/19 0530  WBC 26.2*  HGB 13.7  HCT 47.2  PLT 414*   ------------------------------------------------------------------------------------------------------------------  Chemistries  Recent Labs  Lab 04/01/19 0530  NA 133*  K 5.0  CL 94*   CO2 9*  GLUCOSE 739*  BUN 23*  CREATININE 2.30*  CALCIUM 9.0  AST 66*  ALT 33  ALKPHOS 174*  BILITOT 0.8   ------------------------------------------------------------------------------------------------------------------  Cardiac Enzymes Recent Labs  Lab 04/01/19 0530  TROPONINI 0.06*   ------------------------------------------------------------------------------------------------------------------  RADIOLOGY:  Dg Chest 1 View  Result Date: 04/01/2019 CLINICAL DATA:  Unresponsive. EXAM: CHEST  1 VIEW COMPARISON:  None. FINDINGS: The patient is intubated. Endotracheal tube terminates 3.4 cm above the carina. Side port of the NG tube is beyond the GE junction. The heart is enlarged. This is exaggerated by low lung volumes. Perihilar prominence is noted bilaterally. Minimal basilar airspace opacities likely reflect atelectasis. IMPRESSION: 1. Low lung volumes. 2. Cardiomegaly without failure. 3. Moderate perihilar prominence. This may be due to pulmonary vascular congestion and low lung volumes. Adenopathy or mass lesion is not excluded. 4. Bibasilar airspace opacities likely reflect atelectasis. Electronically Signed   By: San Morelle M.D.   On: 04/01/2019 06:05   Ct Head Wo Contrast  Result Date: 04/01/2019 CLINICAL DATA:  Unresponsive.  Possible seizures. EXAM: CT HEAD WITHOUT CONTRAST TECHNIQUE: Contiguous axial images were obtained from the base of the skull through the vertex without intravenous contrast. COMPARISON:  None. FINDINGS: Brain: No acute infarct, hemorrhage, or mass lesion is present. No significant white matter lesions are present. The ventricles are of normal size. No significant extraaxial fluid collection is present. The brainstem and cerebellum are within normal limits. Vascular: No hyperdense vessel or unexpected calcification. Skull: Calvarium is intact. No focal lytic or blastic lesions are present. Remote right-sided nasal bone fracture is noted.  Sinuses/Orbits: The paranasal sinuses and mastoid air cells are clear. The globes and orbits are within normal limits. IMPRESSION: 1. Normal CT appearance the brain. Electronically Signed   By: San Morelle M.D.   On: 04/01/2019 06:18     IMPRESSION AND PLAN:   *DKA.  Will start on DKA protocol with every 4 BMPs and every 1 hour Accu-Cheks.  Transition to D5 normal saline once blood sugars less than 250.  IV insulin and IV fluids ordered.  Hemoglobin A1c ordered  *New onset seizures.  Loaded with 1 dose of IV Keppra.  Presently on propofol and fentanyl and intubated.  Consulted neurology Dr. Irish Elders and message sent.  Will wait for neurology input prior to starting any further antiseizure medications.  First episode of seizures and likely precipitated due to DKA.  Will not need long-term medications.  *Sepsis present on admission.  At this point patient has leukocytosis, lactic acidosis.  I do not see a clear source of infection and his lab work could be due to DKA.  Will start IV antibiotics and de-escalate once ruled out.  Cultures ordered and pending.  Lactic acid in 3 hours  *Acute kidney injury secondary to DKA and sepsis.  Monitor urine output.  IV fluids ordered.  Repeat labs.  *Acute metabolic encephalopathy due to seizures and DKA.  Presently intubated and sedated.  CT scan of the head negative.  Reassess once extubated and awake.  *Blood in OG tube suction.  This is likely from his tongue bite.  Cannot rule out GI bleed at this time.  I have not started Lovenox.  Will restart if further suction has no blood.  *Vent dependent respiratory failure.  Intubated due to unresponsiveness and absence of gag reflex.  Daily SBT  DVT prophylaxis with SCDs  All the records are reviewed and case discussed with ED provider. Management plans discussed with the patient, family and they are in agreement.  CODE STATUS: FULL CODE  TOTAL TIME TAKING CARE OF THIS PATIENT:80 minutes.    Neita Carp M.D on 04/01/2019 at 8:22 AM  Between 7am to 6pm - Pager - [phone]  After 6pm go to [email or web address] - password [email or web address] [facility]  Office  [phone]  CC: Primary care physician; Gennaro Africa, MD  Note: This dictation was prepared with Dragon dictation along with smaller phrase technology. Any transcriptional errors that result from this process are unintentional.

## 2019-04-01 NOTE — ED Provider Notes (Addendum)
.  Central Line  Date/Time: 04/01/2019 8:48 AM Performed by: Duffy Bruce, MD Authorized by: Duffy Bruce, MD   Consent:    Consent obtained:  Emergent situation   Risks discussed:  Arterial puncture, bleeding, incorrect placement, infection, nerve damage and pneumothorax Pre-procedure details:    Hand hygiene: Hand hygiene performed prior to insertion     Sterile barrier technique: All elements of maximal sterile technique followed     Skin preparation:  2% chlorhexidine   Skin preparation agent: Skin preparation agent completely dried prior to procedure   Sedation:    Sedation type: On propofol/fentanyl drips. Anesthesia (see MAR for exact dosages):    Anesthesia method:  Local infiltration   Local anesthetic:  Lidocaine 1% w/o epi Procedure details:    Location:  R internal jugular   Site selection rationale:  Ease of access, low risk of infection   Patient position:  Trendelenburg   Procedural supplies:  Triple lumen   Catheter size:  6.5 Fr   Landmarks identified: yes     Ultrasound guidance: yes     Sterile ultrasound techniques: Sterile gel and sterile probe covers were used     Number of attempts:  1   Successful placement: yes   Post-procedure details:    Post-procedure:  Dressing applied and line sutured   Assessment:  Blood return through all ports, no pneumothorax on x-ray, free fluid flow and placement verified by x-ray   Patient tolerance of procedure:  Tolerated well, no immediate complications    I was asked by the hospitalist team to place a central line.  Patient is on multiple infusions with difficult peripheral access.  Emergent consent obtained and right IJ placed by myself.  Post insertion chest x-ray confirms appropriate placement without complication.  Nurse given okay to use line.   Duffy Bruce, MD 04/01/19 8719    Duffy Bruce, MD 04/01/19 [phone]

## 2019-04-01 NOTE — Consult Note (Addendum)
Name: Roy Oconnell MRN: [id number] DOB: 1976/03/14    ADMISSION DATE:  04/01/2019 CONSULTATION DATE: 04/01/2019  REFERRING MD : Dr. Darvin Neighbours   CHIEF COMPLAINT: Unresponsiveness    BRIEF PATIENT DESCRIPTION:  43 yo male admitted with acute encephalopathy, questionable to seizure-like activity, DKA requiring insulin gtt and acute hypoxic hypercapnic respiratory failure likely secondary to aspiration pneumonia requiring mechanical intubation.   SIGNIFICANT EVENTS/STUDIES:  06/12-Pt admitted to ICU mechanically intubated  06/12-CT Head negative   HISTORY OF PRESENT ILLNESS:   This is a 43 yo male with a PMH of Obesity, HTN, Type II Diabetes Mellitus, CKD- Stage III (baseline GFR 30-59 ml/min), Unilateral Nephrectomy Renal Transplant Donor, HTN, and Asthma. He presented to [facility] ER on 06/12 from home via EMS with unresponsiveness.  Per ER notes pts roommate reported to EMS the pt had questionable seizure-like activity and became unresponsive, at that time he lowered the pt from the couch to the floor and notified EMS.  Upon EMS arrival he received narcan with no improvement of mentation.  In the ER pt noted to have snoring respirations and responded to painful stimulation.  Therefore, pt mechanically intubated.  Lab results revealed Na+ 133, chloride 94, CO2 9, glucose 739, BUN 23, creatinine 2.30, anion gap 30, alk phos 174, AST 66, troponin 0.06, lactic acid >11.0, wbc 26.2, alcohol level <54, salicylate <5.6, and abg pH 7.00/pCO2 65/pO2 142/bicarb 16.0.  Urine Drug screen negative, UA negative for UTI, and COVID-19 negative.  CXR concerning for cardiomegaly and bilateral opacities vs. atelectasis.  He received 4L NS bolus, vancomycin, zosyn, and clindamycin.  Due to DKA pt started on insulin gtt.  He was subsequently admitted to ICU by hospitalist team for additional workup and treatment.   PAST MEDICAL HISTORY :   has no past medical history on file.  has no past surgical history on file.  Prior to Admission medications   Medication Sig Start Date End Date Taking? Authorizing Provider  amLODipine (NORVASC) 10 MG tablet Take 10 mg by mouth daily. 12/29/17  Yes [provider]  aspirin EC 81 MG tablet Take 81 mg by mouth daily. 08/27/18 05/05/32 Yes [provider]  atorvastatin (LIPITOR) 20 MG tablet Take 20 mg by mouth daily. 12/29/17  Yes [provider]  chlorthalidone (HYGROTON) 25 MG tablet Take 25 mg by mouth daily. 12/29/17  Yes [provider]  glipiZIDE (GLUCOTROL) 10 MG tablet Take 10 mg by mouth 2 (two) times a day. 08/27/18 08/27/19 Yes [provider]  lisinopril (ZESTRIL) 40 MG tablet Take 40 mg by mouth daily. 12/29/17  Yes [provider]  metoprolol succinate (TOPROL-XL) 50 MG 24 hr tablet Take 50 mg by mouth daily. 12/29/17  Yes [provider]  spironolactone (ALDACTONE) 25 MG tablet Take 25 mg by mouth daily. 08/27/18 08/27/19 Yes [provider]   Not on File  FAMILY HISTORY:  family history is not on file. SOCIAL HISTORY:    REVIEW OF SYSTEMS:   Unable to assess pt mechanically intubated   SUBJECTIVE:  Unable to assess pt mechanically intubated   VITAL SIGNS: Temp:  [97.3 F (36.3 C)-98.2 F (36.8 C)] 97.3 F (36.3 C) (06/12 0850) Pulse Rate:  [104-154] 114 (06/12 0850) Resp:  [18-26] 23 (06/12 0850) BP: (97-253)/(51-130) 115/64 (06/12 0850) SpO2:  [72 %-99 %] 94 % (06/12 0850) FiO2 (%):  [60 %-100 %] 60 % (06/12 0837) Weight:  [256 kg] 127 kg (06/12 0636)  PHYSICAL EXAMINATION: General: acutely ill appearing  male, NAD mechanically intubated  Neuro: agitated, withdraws from painful stimulation, PERRL HEENT: supple, no JVD Cardiovascular: sinus tach, no R/G  Lungs: rhonchi throughout, slightly labored  Abdomen: +BS x4, obese, soft, mildly distended  Musculoskeletal: normal bulk and tone, no edema  Skin: intact no rashes or lesions present   Recent Labs  Lab 04/01/19 0530   NA 133*  K 5.0  CL 94*  CO2 9*  BUN 23*  CREATININE 2.30*  GLUCOSE 739*   Recent Labs  Lab 04/01/19 0530  HGB 13.7  HCT 47.2  WBC 26.2*  PLT 414*   Dg Chest 1 View  Result Date: 04/01/2019 CLINICAL DATA:  New right-sided central line placement. EXAM: CHEST  1 VIEW COMPARISON:  None. FINDINGS: The heart size is exaggerated by low lung volumes. Endotracheal tube terminates 4 cm above the carina. Right IJ line is new. There is no pneumothorax. Lung volumes are low. Left greater than right basilar airspace opacities are present. Moderate pulmonary vascular congestion is again noted. IMPRESSION: 1. New right IJ line without radiographic evidence for complication. 2. Low lung volumes and moderate pulmonary vascular congestion. 3. Left greater than right basilar opacities again noted. Electronically Signed   By: San Morelle M.D.   On: 04/01/2019 08:53   Dg Chest 1 View  Result Date: 04/01/2019 CLINICAL DATA:  Unresponsive. EXAM: CHEST  1 VIEW COMPARISON:  None. FINDINGS: The patient is intubated. Endotracheal tube terminates 3.4 cm above the carina. Side port of the NG tube is beyond the GE junction. The heart is enlarged. This is exaggerated by low lung volumes. Perihilar prominence is noted bilaterally. Minimal basilar airspace opacities likely reflect atelectasis. IMPRESSION: 1. Low lung volumes. 2. Cardiomegaly without failure. 3. Moderate perihilar prominence. This may be due to pulmonary vascular congestion and low lung volumes. Adenopathy or mass lesion is not excluded. 4. Bibasilar airspace opacities likely reflect atelectasis. Electronically Signed   By: San Morelle M.D.   On: 04/01/2019 06:05   Ct Head Wo Contrast  Result Date: 04/01/2019 CLINICAL DATA:  Unresponsive.  Possible seizures. EXAM: CT HEAD WITHOUT CONTRAST TECHNIQUE: Contiguous axial images were obtained from the base of the skull through the vertex without intravenous contrast. COMPARISON:  None. FINDINGS:  Brain: No acute infarct, hemorrhage, or mass lesion is present. No significant white matter lesions are present. The ventricles are of normal size. No significant extraaxial fluid collection is present. The brainstem and cerebellum are within normal limits. Vascular: No hyperdense vessel or unexpected calcification. Skull: Calvarium is intact. No focal lytic or blastic lesions are present. Remote right-sided nasal bone fracture is noted. Sinuses/Orbits: The paranasal sinuses and mastoid air cells are clear. The globes and orbits are within normal limits. IMPRESSION: 1. Normal CT appearance the brain. Electronically Signed   By: San Morelle M.D.   On: 04/01/2019 06:18    ASSESSMENT / PLAN:  Acute hypoxic hypercapnic respiratory failure likely secondary to aspiration pneumonia Mechanical intubation  Hx: Asthma Full vent support for now-vent setting reviewed and established  SBT once all parameters met VAP bundle implemented Prn bronchodilator therapy  Mildly elevated troponin likely demand ischemia in setting of respiratory failure Hx: HTN Continuous telemetry monitoring  Trend troponin's Hold outpatient antihypertensives for now   Acute on chronic renal failure (baseline GFR 30-59 ml/min)  Hyponatremia secondary to volume depletion in setting of DKA  Lactic acidosis  Hx: 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) Trend BMP and lactic acid  Replace electrolytes as indicated  Monitor UOP Avoid nephrotoxic medications   Leukocytosis possibly secondary to aspiration pneumonia  Trend WBC and monitor fever curve  Trend PCT  Follow cultures If MRSA PCR negative will stop vancomycin and cefepime and start zosyn  Diabetic ketoacidosis  Continue insulin gtt until anion gap closed and serum CO2 >20 CBG's q1hr and BMP's q4hrs while on insulin gtt   Mild abdominal distension  SUP px: iv protonix  Keep NPO for now  OG tube LIS   Acute  encephalopathy  Questionable seizure activity  Maintain RASS goal 0 to -1 Continue propofol and fentanyl gtts to maintain RASS goal  WUA daily  EEG pending  Neurology consulted appreciate input  Continue iv keppra   VTE px: subq heparin   -Updated pts mother Juliocesar Blasius via telephone regarding pts condition and plan of care all questions answered. She informed me Mr. Denbow has been estranged from his family for several years, and he primarily communicates with her via text message occasionally.  Marda Stalker, [place] Pager [phone] (please enter 7 digits) PCCM Consult Pager [phone] (please enter 7 digits)

## 2019-04-01 NOTE — ED Notes (Signed)
Mother, Alann Avey updated on pts status. Mother provided medical hx.

## 2019-04-01 NOTE — Consult Note (Signed)
Pharmacy Antibiotic Note  Roy Oconnell is a 43 y.o. male admitted on 04/01/2019 with sepsis/DKA.  Pharmacy has was originally consulted for vancomycin and cefepime dosing but is now being changed over to Zosyn. His renal function is significantly worse than baseline: SCr 2.54 (baseline ~1.5). He received a total of 2500 mg IV vancomycin in the ED. The source of his sepsis is suspeccted to be from aspiration PNA  Plan: Start Zosyn 3.375 g IV every 8 hours (EI)  Height: 5\' 10"  (177.8 cm) Weight: 280 lb (127 kg) IBW/kg (Calculated) : 73  Temp (24hrs), Avg:97.5 F (36.4 C), Min:97.3 F (36.3 C), Max:98.2 F (36.8 C)  Recent Labs  Lab 04/01/19 0530 04/01/19 0818 04/01/19 1058 04/01/19 1206  WBC 26.2*  --   --   --   CREATININE 2.30*  --  2.54*  --   LATICACIDVEN >11.0* 10.5*  --  7.2*    Estimated Creatinine Clearance: 51.2 mL/min (A) (by C-G formula based on SCr of 2.54 mg/dL (H)).    Not on File  Antimicrobials this admission: Zosyn 6/12 >> vancomycin 6/12 x2  cefepime 6/12 x1  Microbiology results: 6/12 BCx: pending 6/12 UCx: pending  6/12 MRSA PCR: negative  Thank you for allowing pharmacy to be a part of this patient's care.  Dallie Piles, PharmD 04/01/2019 1:44 PM

## 2019-04-01 NOTE — ED Provider Notes (Signed)
[facility] Emergency Department Provider Note   ____________________________________________   First MD Initiated Contact with Patient 04/01/19 [phone]     (approximate)  I have reviewed the triage vital signs and the nursing notes.   HISTORY  Chief Complaint Seizures and Loss of Consciousness  Level V caveat: Limited by unresponsive state  HPI Roy Oconnell is a 43 y.o. male brought to the ED from home via EMS with a chief complaint of seizure and unresponsiveness.  EMS reported that roommate saw patient have seizure-like activity, lowered him to the floor from the couch.  EMS gave Narcan with no response.  Rest of history is limited given patient's unresponsive nature.  Arrives to the ED with sonorous respirations and responds to painful stimuli.       Past medical history  Hypertension, unspecified type   Renovascular hypertension  Secondary renovascular hypertension, unspecified   CKD (chronic kidney disease) stage 3, GFR 30-59 ml/min (CMS-HCC)  Chronic kidney disease, Stage III (moderate)   Diabetes mellitus type 2 in obese (CMS-HCC)  Type II or unspecified type diabetes mellitus without mention of complication, not stated as uncontrolled     There are no active problems to display for this patient.   Prior to Admission medications   Medication Sig Start Date End Date Taking? Authorizing Provider  amLODipine (NORVASC) 10 MG tablet Take 10 mg by mouth daily. 12/29/17  Yes [provider]  aspirin EC 81 MG tablet Take 81 mg by mouth daily. 08/27/18 05/05/32 Yes [provider]  atorvastatin (LIPITOR) 20 MG tablet Take 20 mg by mouth daily. 12/29/17  Yes [provider]  chlorthalidone (HYGROTON) 25 MG tablet Take 25 mg by mouth daily. 12/29/17  Yes [provider]  glipiZIDE (GLUCOTROL) 10 MG tablet Take 10 mg by mouth 2 (two) times a day. 08/27/18 08/27/19 Yes [provider]  lisinopril (ZESTRIL)  40 MG tablet Take 40 mg by mouth daily. 12/29/17  Yes [provider]  metoprolol succinate (TOPROL-XL) 50 MG 24 hr tablet Take 50 mg by mouth daily. 12/29/17  Yes [provider]  spironolactone (ALDACTONE) 25 MG tablet Take 25 mg by mouth daily. 08/27/18 08/27/19 Yes [provider]    Allergies Patient has no allergy information on record.  No family history on file.  Social History Social History   Tobacco Use  . Smoking status: Not on file  Substance Use Topics  . Alcohol use: Not on file  . Drug use: Not on file  Unknown  Review of Systems  Constitutional: Positive for unresponsive state.  No fever/chills Eyes: No visual changes. ENT: No sore throat. Cardiovascular: Denies chest pain. Respiratory: Denies shortness of breath. Gastrointestinal: No abdominal pain.  No nausea, no vomiting.  No diarrhea.  No constipation. Genitourinary: Negative for dysuria. Musculoskeletal: Negative for back pain. Skin: Negative for rash. Neurological: Positive for seizure.  Negative for headaches, focal weakness or numbness.   ____________________________________________   PHYSICAL EXAM:  VITAL SIGNS: ED Triage Vitals  Enc Vitals Group     BP 04/01/19 0520 (!) 253/130     Pulse Rate 04/01/19 0520 (!) 147     Resp 04/01/19 0521 (!) 26     Temp --      Temp src --      SpO2 04/01/19 0520 (!) 72 %     Weight --      Height --      Head Circumference --  Peak Flow --      Pain Score --      Pain Loc --      Pain Edu? --      Excl. in [place]? --     Constitutional: Unresponsive with sonorous respirations. Eyes: Conjunctivae are normal.  Pinpoint pupils.  PERRL. EOMI. Head: Atraumatic. Nose: Atraumatic. Mouth/Throat: Mucous membranes are moist.  Bit tongue. Neck: No stridor.  No step-offs or deformities. Cardiovascular: Tachycardic rate, regular rhythm. Grossly normal heart sounds.  Good peripheral circulation. Respiratory: Sonorous respirations.  Gastrointestinal: Obese.  Soft and nontender. No distention. No abdominal bruits.  Musculoskeletal: No lower extremity tenderness. 2+ BLE edema.  No joint effusions. Neurologic:  Normal speech and language. No gross focal neurologic deficits are appreciated. No gait instability. Skin:  Skin is warm, dry and intact. No rash noted. Psychiatric: Mood and affect are normal. Speech and behavior are normal.  ____________________________________________   LABS (all labs ordered are listed, but only abnormal results are displayed)  Labs Reviewed  LACTIC ACID, PLASMA - Abnormal; Notable for the following components:      Result Value   Lactic Acid, Venous >11.0 (*)    All other components within normal limits  CBC WITH DIFFERENTIAL/PLATELET - Abnormal; Notable for the following components:   WBC 26.2 (*)    MCV 104.7 (*)    MCHC 29.0 (*)    Platelets 414 (*)    nRBC 2.0 (*)    Neutro Abs 14.1 (*)    Lymphs Abs 8.0 (*)    Monocytes Absolute 1.1 (*)    Eosinophils Absolute 1.4 (*)    Basophils Absolute 0.3 (*)    Abs Immature Granulocytes 1.30 (*)    All other components within normal limits  COMPREHENSIVE METABOLIC PANEL - Abnormal; Notable for the following components:   Sodium 133 (*)    Chloride 94 (*)    CO2 9 (*)    Glucose, Bld 739 (*)    BUN 23 (*)    Creatinine, Ser 2.30 (*)    Total Protein 8.2 (*)    AST 66 (*)    Alkaline Phosphatase 174 (*)    GFR calc non Af Amer 34 (*)    GFR calc Af Amer 39 (*)    Anion gap 30 (*)    All other components within normal limits  ACETAMINOPHEN LEVEL - Abnormal; Notable for the following components:   Acetaminophen (Tylenol), Serum <10 (*)    All other components within normal limits  TROPONIN I - Abnormal; Notable for the following components:   Troponin I 0.06 (*)    All other components within normal limits  URINALYSIS, COMPLETE (UACMP) WITH MICROSCOPIC - Abnormal; Notable for the following components:   Color, Urine YELLOW (*)     APPearance CLEAR (*)    Glucose, UA >=500 (*)    Hgb urine dipstick MODERATE (*)    Protein, ur 100 (*)    All other components within normal limits  CULTURE, BLOOD (ROUTINE X 2)  CULTURE, BLOOD (ROUTINE X 2)  URINE CULTURE  SARS CORONAVIRUS 2 (HOSPITAL ORDER, [facility])  ETHANOL  SALICYLATE LEVEL  LIPASE, BLOOD  URINE DRUG SCREEN, QUALITATIVE ([facility] ONLY)  LACTIC ACID, PLASMA  BLOOD GAS, ARTERIAL  BETA-HYDROXYBUTYRIC ACID   ____________________________________________  EKG  ED ECG REPORT I, Kailoni Vahle J, the attending physician, personally viewed and interpreted this ECG.   Date: 04/01/2019  EKG Time: 0536  Rate: 135  Rhythm: sinus tachycardia  Axis: LAD  Intervals: QTc 557  ST&T Change: Nonspecific  ____________________________________________  RADIOLOGY  ED MD interpretation: Cardiomegaly, no ICH  Official radiology report(s): Dg Chest 1 View  Result Date: 04/01/2019 CLINICAL DATA:  Unresponsive. EXAM: CHEST  1 VIEW COMPARISON:  None. FINDINGS: The patient is intubated. Endotracheal tube terminates 3.4 cm above the carina. Side port of the NG tube is beyond the GE junction. The heart is enlarged. This is exaggerated by low lung volumes. Perihilar prominence is noted bilaterally. Minimal basilar airspace opacities likely reflect atelectasis. IMPRESSION: 1. Low lung volumes. 2. Cardiomegaly without failure. 3. Moderate perihilar prominence. This may be due to pulmonary vascular congestion and low lung volumes. Adenopathy or mass lesion is not excluded. 4. Bibasilar airspace opacities likely reflect atelectasis. Electronically Signed   By: San Morelle M.D.   On: 04/01/2019 06:05   Ct Head Wo Contrast  Result Date: 04/01/2019 CLINICAL DATA:  Unresponsive.  Possible seizures. EXAM: CT HEAD WITHOUT CONTRAST TECHNIQUE: Contiguous axial images were obtained from the base of the skull through the vertex without intravenous contrast.  COMPARISON:  None. FINDINGS: Brain: No acute infarct, hemorrhage, or mass lesion is present. No significant white matter lesions are present. The ventricles are of normal size. No significant extraaxial fluid collection is present. The brainstem and cerebellum are within normal limits. Vascular: No hyperdense vessel or unexpected calcification. Skull: Calvarium is intact. No focal lytic or blastic lesions are present. Remote right-sided nasal bone fracture is noted. Sinuses/Orbits: The paranasal sinuses and mastoid air cells are clear. The globes and orbits are within normal limits. IMPRESSION: 1. Normal CT appearance the brain. Electronically Signed   By: San Morelle M.D.   On: 04/01/2019 06:18    ____________________________________________   PROCEDURES  Procedure(s) performed (including Critical Care):  Procedure Name: Intubation Date/Time: 04/01/2019 6:44 AM Performed by: Paulette Blanch, MD Pre-anesthesia Checklist: Patient identified, Patient being monitored, Emergency Drugs available, Timeout performed and Suction available Oxygen Delivery Method: Non-rebreather mask Preoxygenation: Pre-oxygenation with 100% oxygen Induction Type: Rapid sequence Ventilation: Mask ventilation without difficulty Laryngoscope Size: Glidescope and 3 Grade View: Grade II Tube size: 7.5 mm Number of attempts: 1 Airway Equipment and Method: Rigid stylet Placement Confirmation: ETT inserted through vocal cords under direct vision,  CO2 detector and Breath sounds checked- equal and bilateral          CRITICAL CARE Performed by: Paulette Blanch   Total critical care time: 45 minutes  Critical care time was exclusive of separately billable procedures and treating other patients.  Critical care was necessary to treat or prevent imminent or life-threatening deterioration.  Critical care was time spent personally by me on the following activities: development of treatment plan with patient and/or  surrogate as well as nursing, discussions with consultants, evaluation of patient's response to treatment, examination of patient, obtaining history from patient or surrogate, ordering and performing treatments and interventions, ordering and review of laboratory studies, ordering and review of radiographic studies, pulse oximetry and re-evaluation of patient's condition. ____________________________________________   INITIAL IMPRESSION / ASSESSMENT AND PLAN / ED COURSE  As part of my medical decision making, I reviewed the following data within the [place] History obtained from family, Nursing notes reviewed and incorporated, Labs reviewed, EKG interpreted, Old chart reviewed, Radiograph reviewed, Discussed with admitting physician and Notes from prior ED visits     Roy Oconnell was evaluated in Emergency Department on 04/01/2019 for the symptoms described in the history of present illness. He  was evaluated in the context of the global COVID-19 pandemic, which necessitated consideration that the patient might be at risk for infection with the SARS-CoV-2 virus that causes COVID-19. Institutional protocols and algorithms that pertain to the evaluation of patients at risk for COVID-19 are in a state of rapid change based on information released by regulatory bodies including the CDC and federal and state organizations. These policies and algorithms were followed during the patient's care in the ED.   43 year old male with hypertension, kidney disease who presents to the ED status post seizure like activity, unresponsive with snoring respirations.  Diagnosis includes but is not limited to [place], CVA, COVID-19, pneumonia, metabolic, infectious etiologies, etc.  Patient was intubated upon his arrival to the treatment room for snoring respirations with minimal gag reflex.  Lab work and urinalysis collected and patient was sent for urgent CT head.  Clinical Course as of Mar 31 652   Fri Apr 01, 2019  0615 Leukocytosis noted.  Will empirically cover with broad-spectrum antibiotics.   [JS]  0626 Lactic acid noted.  Awaiting results of COVID swab to determine hospitalization at this facility versus transfer.   [JS]  [id number] Verbal report by laboratory of glucose greater than 700.  Will add beta hydroxybutyrate.  Consider insulin drip if anion gap is elevated.   [JS]  [id number] Afebrile by [place] temp.   [JS]  [id number] Initiate ED code sepsis.  Atelectasis and low lung volumes concerning for pneumonia.   [JS]  [phone] Patient's mother updated via telephone by nursing staff   [JS]  [phone] Also will cover with clindamycin for probable aspiration.  Discussed with hospitalist Dr. Marcille Blanco for admission.   [JS]    Clinical Course User Index [JS] Paulette Blanch, MD     ____________________________________________   FINAL CLINICAL IMPRESSION(S) / ED DIAGNOSES  Final diagnoses:  Sepsis, due to unspecified organism, unspecified whether acute organ dysfunction present ([place])  Seizure-like activity ([place])  Acute respiratory failure with hypoxia ([place])  Community acquired pneumonia, unspecified laterality  Hyperglycemia  Aspiration pneumonia, unspecified aspiration pneumonia type, unspecified laterality, unspecified part of lung [facility])     ED Discharge Orders    None       Note:  This document was prepared using Dragon voice recognition software and may include unintentional dictation errors.   Paulette Blanch, MD 04/02/19 [phone]

## 2019-04-01 NOTE — ED Notes (Signed)
Admitting MD at bedside to assess patient.

## 2019-04-02 ENCOUNTER — Inpatient Hospital Stay: Payer: Self-pay

## 2019-04-02 ENCOUNTER — Inpatient Hospital Stay (HOSPITAL_COMMUNITY)
Admit: 2019-04-02 | Discharge: 2019-04-02 | Disposition: A | Discharge status: Home / Self Care | Payer: Self-pay | Attending: Critical Care Medicine | Admitting: Critical Care Medicine

## 2019-04-02 ENCOUNTER — Encounter: Payer: Self-pay | Admitting: General Practice

## 2019-04-02 DIAGNOSIS — I340 Nonrheumatic mitral (valve) insufficiency: Secondary | ICD-10-CM

## 2019-04-02 DIAGNOSIS — E0811 Diabetes mellitus due to underlying condition with ketoacidosis with coma: Secondary | ICD-10-CM

## 2019-04-02 LAB — COMPREHENSIVE METABOLIC PANEL
ALT: 29 U/L (ref 0–44)
AST: 32 U/L (ref 15–41)
Albumin: 3.4 g/dL — ABNORMAL LOW (ref 3.5–5.0)
Alkaline Phosphatase: 68 U/L (ref 38–126)
Anion gap: 12 (ref 5–15)
BUN: 28 mg/dL — ABNORMAL HIGH (ref 6–20)
CO2: 22 mmol/L (ref 22–32)
Calcium: 8 mg/dL — ABNORMAL LOW (ref 8.9–10.3)
Chloride: 104 mmol/L (ref 98–111)
Creatinine, Ser: 4.64 mg/dL — ABNORMAL HIGH (ref 0.61–1.24)
GFR calc Af Amer: 17 mL/min — ABNORMAL LOW (ref >60)
GFR calc non Af Amer: 15 mL/min — ABNORMAL LOW (ref >60)
Glucose, Bld: 322 mg/dL — ABNORMAL HIGH (ref 70–99)
Potassium: 4.8 mmol/L (ref 3.5–5.1)
Sodium: 138 mmol/L (ref 135–145)
Total Bilirubin: 0.9 mg/dL (ref 0.3–1.2)
Total Protein: 6.2 g/dL — ABNORMAL LOW (ref 6.5–8.1)

## 2019-04-02 LAB — RENAL FUNCTION PANEL
Albumin: 3.4 g/dL — ABNORMAL LOW (ref 3.5–5.0)
Albumin: 3.6 g/dL (ref 3.5–5.0)
Albumin: 3.5 g/dL (ref 3.5–5.0)
Anion gap: 11 (ref 5–15)
Anion gap: 12 (ref 5–15)
Anion gap: 11 (ref 5–15)
BUN: 29 mg/dL — ABNORMAL HIGH (ref 6–20)
BUN: 27 mg/dL — ABNORMAL HIGH (ref 6–20)
BUN: 24 mg/dL — ABNORMAL HIGH (ref 6–20)
CO2: 22 mmol/L (ref 22–32)
CO2: 23 mmol/L (ref 22–32)
CO2: 23 mmol/L (ref 22–32)
Calcium: 7.9 mg/dL — ABNORMAL LOW (ref 8.9–10.3)
Calcium: 8 mg/dL — ABNORMAL LOW (ref 8.9–10.3)
Calcium: 8 mg/dL — ABNORMAL LOW (ref 8.9–10.3)
Chloride: 103 mmol/L (ref 98–111)
Chloride: 103 mmol/L (ref 98–111)
Chloride: 102 mmol/L (ref 98–111)
Creatinine, Ser: 5.03 mg/dL — ABNORMAL HIGH (ref 0.61–1.24)
Creatinine, Ser: 4.63 mg/dL — ABNORMAL HIGH (ref 0.61–1.24)
Creatinine, Ser: 4.48 mg/dL — ABNORMAL HIGH (ref 0.61–1.24)
GFR calc Af Amer: 15 mL/min — ABNORMAL LOW (ref >60)
GFR calc Af Amer: 17 mL/min — ABNORMAL LOW (ref >60)
GFR calc Af Amer: 18 mL/min — ABNORMAL LOW (ref >60)
GFR calc non Af Amer: 13 mL/min — ABNORMAL LOW (ref >60)
GFR calc non Af Amer: 15 mL/min — ABNORMAL LOW (ref >60)
GFR calc non Af Amer: 15 mL/min — ABNORMAL LOW (ref >60)
Glucose, Bld: 359 mg/dL — ABNORMAL HIGH (ref 70–99)
Glucose, Bld: 297 mg/dL — ABNORMAL HIGH (ref 70–99)
Glucose, Bld: 320 mg/dL — ABNORMAL HIGH (ref 70–99)
Phosphorus: 5.2 mg/dL — ABNORMAL HIGH (ref 2.5–4.6)
Phosphorus: 4.6 mg/dL (ref 2.5–4.6)
Phosphorus: 4.5 mg/dL (ref 2.5–4.6)
Potassium: 4.8 mmol/L (ref 3.5–5.1)
Potassium: 4.6 mmol/L (ref 3.5–5.1)
Potassium: 4.4 mmol/L (ref 3.5–5.1)
Sodium: 136 mmol/L (ref 135–145)
Sodium: 138 mmol/L (ref 135–145)
Sodium: 136 mmol/L (ref 135–145)

## 2019-04-02 LAB — BLOOD GAS, ARTERIAL
Acid-base deficit: 5.2 mmol/L — ABNORMAL HIGH (ref 0.0–2.0)
Acid-base deficit: 3.4 mmol/L — ABNORMAL HIGH (ref 0.0–2.0)
Bicarbonate: 22.4 mmol/L (ref 20.0–28.0)
Bicarbonate: 24.9 mmol/L (ref 20.0–28.0)
FIO2: 0.5
FIO2: 0.4
MECHVT: 500 mL
MECHVT: 500 mL
O2 Saturation: 98.4 %
O2 Saturation: 97.6 %
PEEP: 8 cmH2O
PEEP: 8 cmH2O
Patient temperature: 37
Patient temperature: 37
RATE: 18 resp/min
RATE: 18 resp/min
pCO2 arterial: 51 mmHg — ABNORMAL HIGH (ref 32.0–48.0)
pCO2 arterial: 58 mmHg — ABNORMAL HIGH (ref 32.0–48.0)
pH, Arterial: 7.25 — ABNORMAL LOW (ref 7.350–7.450)
pH, Arterial: 7.24 — ABNORMAL LOW (ref 7.350–7.450)
pO2, Arterial: 128 mmHg — ABNORMAL HIGH (ref 83.0–108.0)
pO2, Arterial: 113 mmHg — ABNORMAL HIGH (ref 83.0–108.0)

## 2019-04-02 LAB — GLUCOSE, CAPILLARY
Glucose-Capillary: 168 mg/dL — ABNORMAL HIGH (ref 70–99)
Glucose-Capillary: 266 mg/dL — ABNORMAL HIGH (ref 70–99)
Glucose-Capillary: 286 mg/dL — ABNORMAL HIGH (ref 70–99)
Glucose-Capillary: 296 mg/dL — ABNORMAL HIGH (ref 70–99)
Glucose-Capillary: 297 mg/dL — ABNORMAL HIGH (ref 70–99)
Glucose-Capillary: 306 mg/dL — ABNORMAL HIGH (ref 70–99)
Glucose-Capillary: 311 mg/dL — ABNORMAL HIGH (ref 70–99)

## 2019-04-02 LAB — MAGNESIUM
Magnesium: 2.8 mg/dL — ABNORMAL HIGH (ref 1.7–2.4)
Magnesium: 2.7 mg/dL — ABNORMAL HIGH (ref 1.7–2.4)
Magnesium: 2.5 mg/dL — ABNORMAL HIGH (ref 1.7–2.4)

## 2019-04-02 LAB — CBC
HCT: 35.8 % — ABNORMAL LOW (ref 39.0–52.0)
Hemoglobin: 10.9 g/dL — ABNORMAL LOW (ref 13.0–17.0)
MCH: 30.7 pg (ref 26.0–34.0)
MCHC: 30.4 g/dL (ref 30.0–36.0)
MCV: 100.8 fL — ABNORMAL HIGH (ref 80.0–100.0)
Platelets: 232 10*3/uL (ref 150–400)
RBC: 3.55 MIL/uL — ABNORMAL LOW (ref 4.22–5.81)
RDW: 14 % (ref 11.5–15.5)
WBC: 9 10*3/uL (ref 4.0–10.5)
nRBC: 0.4 % — ABNORMAL HIGH (ref 0.0–0.2)

## 2019-04-02 LAB — URINE CULTURE: Culture: NO GROWTH

## 2019-04-02 LAB — LACTIC ACID, PLASMA: Lactic Acid, Venous: 1.4 mmol/L (ref 0.5–1.9)

## 2019-04-02 LAB — TROPONIN I: Troponin I: 0.38 ng/mL (ref <0.03)

## 2019-04-02 LAB — HIV ANTIBODY (ROUTINE TESTING W REFLEX): HIV Screen 4th Generation wRfx: NONREACTIVE

## 2019-04-02 MED ORDER — VITAL HIGH PROTEIN PO LIQD
1000.0000 mL | ORAL | Status: DC
  Administered 2019-04-02: 15:00:00 1000 mL

## 2019-04-02 MED ORDER — LORAZEPAM 2 MG/ML IJ SOLN
4.0000 mg | Freq: Once | INTRAMUSCULAR | Status: AC
  Administered 2019-04-02: 11:00:00 4 mg via INTRAVENOUS

## 2019-04-02 MED ORDER — PRO-STAT SUGAR FREE PO LIQD
30.0000 mL | Freq: Two times a day (BID) | ORAL | Status: DC
  Administered 2019-04-02 (×2): 30 mL

## 2019-04-02 MED ORDER — STERILE WATER FOR INJECTION IJ SOLN
INTRAMUSCULAR | Status: AC
  Administered 2019-04-02: 20:00:00
  Filled 2019-04-02: qty 10

## 2019-04-02 MED ORDER — VECURONIUM BROMIDE 10 MG IV SOLR
10.0000 mg | INTRAVENOUS | Status: DC | PRN
  Administered 2019-04-02 – 2019-04-04 (×4): 10 mg via INTRAVENOUS
  Filled 2019-04-02 (×4): qty 10

## 2019-04-02 MED ORDER — PUREFLOW DIALYSIS SOLUTION
INTRAVENOUS | Status: DC
  Administered 2019-04-02 (×2): via INTRAVENOUS_CENTRAL
  Administered 2019-04-03: 13:00:00 3 via INTRAVENOUS_CENTRAL
  Administered 2019-04-03: 01:00:00 via INTRAVENOUS_CENTRAL
  Administered 2019-04-03 – 2019-04-04 (×2): 3 via INTRAVENOUS_CENTRAL

## 2019-04-02 MED ORDER — LORAZEPAM 2 MG/ML IJ SOLN
INTRAMUSCULAR | Status: AC
  Filled 2019-04-02: qty 2

## 2019-04-02 MED ORDER — STERILE WATER FOR INJECTION IJ SOLN
INTRAMUSCULAR | Status: AC
  Filled 2019-04-02: qty 10

## 2019-04-02 MED ORDER — ORAL CARE MOUTH RINSE
15.0000 mL | OROMUCOSAL | Status: DC
  Administered 2019-04-02 – 2019-04-05 (×36): 15 mL via OROMUCOSAL

## 2019-04-02 MED ORDER — CHLORHEXIDINE GLUCONATE CLOTH 2 % EX PADS
6.0000 | MEDICATED_PAD | Freq: Every day | CUTANEOUS | Status: DC
  Administered 2019-04-02 – 2019-04-15 (×11): 6 via TOPICAL

## 2019-04-02 MED ORDER — HEPARIN SODIUM (PORCINE) 1000 UNIT/ML DIALYSIS
1000.0000 [IU] | INTRAMUSCULAR | Status: DC | PRN
  Administered 2019-04-04: 2800 [IU] via INTRAVENOUS_CENTRAL
  Filled 2019-04-02: qty 4
  Filled 2019-04-02 (×2): qty 6

## 2019-04-02 MED ORDER — SODIUM CHLORIDE 0.9% FLUSH
10.0000 mL | INTRAVENOUS | Status: DC | PRN
  Administered 2019-04-07: 10 mL
  Filled 2019-04-02: qty 40

## 2019-04-02 MED ORDER — SODIUM CHLORIDE 0.9% FLUSH
10.0000 mL | Freq: Two times a day (BID) | INTRAVENOUS | Status: DC
  Administered 2019-04-02: 30 mL
  Administered 2019-04-02 – 2019-04-07 (×9): 10 mL

## 2019-04-02 MED ORDER — INSULIN ASPART 100 UNIT/ML ~~LOC~~ SOLN
0.0000 [IU] | SUBCUTANEOUS | Status: DC
  Administered 2019-04-02: 21:00:00 11 [IU] via SUBCUTANEOUS
  Administered 2019-04-02: 8 [IU] via SUBCUTANEOUS
  Administered 2019-04-02: 3 [IU] via SUBCUTANEOUS
  Administered 2019-04-03: 8 [IU] via SUBCUTANEOUS
  Administered 2019-04-03: 12:00:00 15 [IU] via SUBCUTANEOUS
  Administered 2019-04-03: 05:00:00 8 [IU] via SUBCUTANEOUS
  Administered 2019-04-03: 11 [IU] via SUBCUTANEOUS
  Administered 2019-04-03 – 2019-04-04 (×2): 8 [IU] via SUBCUTANEOUS
  Administered 2019-04-04: 3 [IU] via SUBCUTANEOUS
  Administered 2019-04-04: 5 [IU] via SUBCUTANEOUS
  Administered 2019-04-04: 20:00:00 8 [IU] via SUBCUTANEOUS
  Administered 2019-04-04 (×2): 5 [IU] via SUBCUTANEOUS
  Administered 2019-04-04 – 2019-04-05 (×2): 11 [IU] via SUBCUTANEOUS
  Administered 2019-04-05: 08:00:00 8 [IU] via SUBCUTANEOUS
  Administered 2019-04-05: 3 [IU] via SUBCUTANEOUS
  Administered 2019-04-05: 04:00:00 5 [IU] via SUBCUTANEOUS
  Administered 2019-04-05 (×2): 3 [IU] via SUBCUTANEOUS
  Administered 2019-04-06 (×2): 8 [IU] via SUBCUTANEOUS
  Administered 2019-04-06 – 2019-04-07 (×4): 5 [IU] via SUBCUTANEOUS
  Administered 2019-04-07: 21:00:00 3 [IU] via SUBCUTANEOUS
  Administered 2019-04-07: 18:00:00 2 [IU] via SUBCUTANEOUS
  Administered 2019-04-07: 09:00:00 5 [IU] via SUBCUTANEOUS
  Administered 2019-04-07: 8 [IU] via SUBCUTANEOUS
  Administered 2019-04-07: 14:00:00 3 [IU] via SUBCUTANEOUS
  Administered 2019-04-08: 23:00:00 2 [IU] via SUBCUTANEOUS
  Administered 2019-04-08: 04:00:00 5 [IU] via SUBCUTANEOUS
  Administered 2019-04-08: 3 [IU] via SUBCUTANEOUS
  Administered 2019-04-08: 12:00:00 5 [IU] via SUBCUTANEOUS
  Administered 2019-04-08 – 2019-04-09 (×3): 3 [IU] via SUBCUTANEOUS
  Administered 2019-04-09: 2 [IU] via SUBCUTANEOUS
  Administered 2019-04-10: 3 [IU] via SUBCUTANEOUS
  Administered 2019-04-10 (×2): 2 [IU] via SUBCUTANEOUS
  Administered 2019-04-10: 3 [IU] via SUBCUTANEOUS
  Filled 2019-04-02 (×44): qty 1

## 2019-04-02 MED ORDER — CHLORHEXIDINE GLUCONATE 0.12 % MT SOLN
OROMUCOSAL | Status: AC
  Administered 2019-04-02: 15 mL via OROMUCOSAL
  Filled 2019-04-02: qty 15

## 2019-04-02 MED ORDER — VECURONIUM BROMIDE 10 MG IV SOLR
10.0000 mg | Freq: Once | INTRAVENOUS | Status: AC
  Administered 2019-04-02: 10 mg via INTRAVENOUS

## 2019-04-02 MED ORDER — CHLORHEXIDINE GLUCONATE 0.12% ORAL RINSE (MEDLINE KIT)
15.0000 mL | Freq: Two times a day (BID) | OROMUCOSAL | Status: DC
  Administered 2019-04-02 – 2019-04-11 (×14): 15 mL via OROMUCOSAL

## 2019-04-02 MED ORDER — VECURONIUM BROMIDE 10 MG IV SOLR
INTRAVENOUS | Status: AC
  Administered 2019-04-02: 11:00:00 10 mg via INTRAVENOUS
  Filled 2019-04-02: qty 10

## 2019-04-02 NOTE — Consult Note (Signed)
Pharmacy Antibiotic Note  Roy Oconnell is a 43 y.o. male admitted on 04/01/2019 with sepsis/DKA.  Pharmacy has was originally consulted for vancomycin and cefepime dosing but is now being changed over to Zosyn. His renal function is significantly worse than baseline: SCr 5.03 (baseline ~1.5). He received a total of 2500 mg IV vancomycin in the ED. The source of his sepsis is suspeccted to be from aspiration PNA. Starting CRRT.   Plan: Continue Zosyn 3.375 g IV every 8 hours (EI)  Height: 5\' 10"  (177.8 cm) Weight: (!) 302 lb 0.5 oz (137 kg) IBW/kg (Calculated) : 73  Temp (24hrs), Avg:99 F (37.2 C), Min:98.4 F (36.9 C), Max:99.3 F (37.4 C)  Recent Labs  Lab 04/01/19 0530 04/01/19 0818 04/01/19 1058 04/01/19 1206 04/01/19 1701 04/02/19 0730 04/02/19 1247  WBC 26.2*  --   --   --   --  9.0  --   CREATININE 2.30*  --  2.54*  --  3.16* 4.64* 5.03*  LATICACIDVEN >11.0* 10.5*  --  7.2*  --   --   --     Estimated Creatinine Clearance: 27 mL/min (A) (by C-G formula based on SCr of 5.03 mg/dL (H)).    Not on File  Antimicrobials this admission: Zosyn 6/12 >> vancomycin 6/12 x2  cefepime 6/12 x1  Microbiology results: 6/12 BCx: pending 6/12 UCx: pending  6/12 MRSA PCR: negative  Thank you for allowing pharmacy to be a part of this patient's care.  Oswald Hillock, PharmD 04/02/2019 1:52 PM

## 2019-04-02 NOTE — Progress Notes (Signed)
Inpatient Diabetes Program Recommendations  AACE/ADA: New Consensus Statement on Inpatient Glycemic Control (2015)  Target Ranges:  Prepandial:   less than 140 mg/dL      Peak postprandial:   less than 180 mg/dL (1-2 hours)      Critically ill patients:  140 - 180 mg/dL   Lab Results  Component Value Date   GLUCAP 296 (H) 04/02/2019   HGBA1C 14.8 (H) 04/01/2019    Review of Glycemic Control Results for KYCE, GING (MRN [id number]) as of 04/02/2019 09:13  Ref. Range 04/02/2019 00:00 04/02/2019 03:40 04/02/2019 07:24  Glucose-Capillary Latest Ref Range: 70 - 99 mg/dL 266 (H) 306 (H) 296 (H)   Diabetes history: DM 2 Outpatient Diabetes medications: Glucotrol 10 mg bid Current orders for Inpatient glycemic control: Lantus 20 units bid Novolog sensitive q 4 hours  Inpatient Diabetes Program Recommendations:    Agree with orders. A1c indicates need for insulin at discharge.  Will follow and see patient when appropriate.  Referral received.   Thanks,  Adah Perl, RN, BC-ADM Inpatient Diabetes Coordinator Pager [phone]

## 2019-04-02 NOTE — Procedures (Signed)
Central Venous Dailysis Catheter Placement: TRIPLE LUMEN  Indication: Hemo Dialysis/CRRT   Consent:emergent   Hand washing performed prior to starting the procedure.   Procedure: An active timeout was performed and correct patient, name, & ID confirmed. Patient was positioned correctly for central venous access. Patient was prepped using strict sterile technique including chlorohexadine preps, sterile drape, sterile gown and sterile gloves.  The area was prepped, draped and anesthetized in the usual sterile manner. Patient comfort was obtained.    A Double lumen catheter was placed in LEFT IJ  Vein There was good blood return, catheter caps were placed on lumens, catheter flushed easily, the line was secured and a sterile dressing and BIO-PATCH applied.   Ultrasound was used to visualize vasculature and guidance of needle.   Number of Attempts: 1 Complications:none  Estimated Blood Loss: none Operator: Eleisha Branscomb.   Derrek Puff David Loneta Tamplin, M.D.  [facility]  Medical Director ICU-[facility] Medical Director [facility] Cardio-Pulmonary Department     

## 2019-04-02 NOTE — Progress Notes (Signed)
CRITICAL CARE NOTE  CC  follow up respiratory failure  SUBJECTIVE Patient remains critically ill Prognosis is guarded Creatinine worsening Severe ACIDOSIS On vent support Severe hypoxia +aspiration pneumonia  Vent Mode: PRVC FiO2 (%):  [50 %-60 %] 50 % Set Rate:  [18 bmp] 18 bmp Vt Set:  [500 mL] 500 mL PEEP:  [8 cmH20] 8 cmH20 Plateau Pressure:  [14 cmH20] 14 cmH20    BP (!) 98/58   Pulse 93   Temp 99 F (37.2 C)   Resp 13   Ht _0  (1.778 m)   Wt (!) 137 kg   SpO2 90%   BMI 43.34 kg/m    I/O last 3 completed shifts: In: 1223.1 [I.V.:1123.7; IV Piggyback:99.4] Out: 1600 [Urine:1600] No intake/output data recorded.  SpO2: 90 % FiO2 (%): 50 %   SIGNIFICANT EVENTS 6/12 admitted for seizures, DKA, aspiration pnwumonia 6/13 severe hypoxia, acidosis  REVIEW OF SYSTEMS  PATIENT IS UNABLE TO PROVIDE COMPLETE REVIEW OF SYSTEMS DUE TO SEVERE CRITICAL ILLNESS   PHYSICAL EXAMINATION:  GENERAL:critically ill appearing, +resp distress HEAD: Normocephalic, atraumatic.  EYES: Pupils equal, round, reactive to light.  No scleral icterus.  MOUTH: Moist mucosal membrane. NECK: Supple. No thyromegaly. No nodules. No JVD.  PULMONARY: +rhonchi, +wheezing CARDIOVASCULAR: S1 and S2. Regular rate and rhythm. No murmurs, rubs, or gallops.  GASTROINTESTINAL: Soft, nontender, -distended. No masses. Positive bowel sounds. No hepatosplenomegaly.  MUSCULOSKELETAL: No swelling, clubbing, or edema.  NEUROLOGIC: obtunded, GCS<8 SKIN:intact,warm,dry  MEDICATIONS: I have reviewed all medications and confirmed regimen as documented   CULTURE RESULTS   Recent Results (from the past 240 hour(s))  SARS Coronavirus 2 (CEPHEID - Performed in [facility] hospital lab), Hosp Order     Status: None   Collection Time: 04/01/19  5:30 AM   Specimen: Nasopharyngeal Swab  Result Value Ref Range Status   SARS Coronavirus 2 NEGATIVE NEGATIVE Final    Comment: (NOTE) If result is  NEGATIVE SARS-CoV-2 target nucleic acids are NOT DETECTED. The SARS-CoV-2 RNA is generally detectable in upper and lower  respiratory specimens during the acute phase of infection. The lowest  concentration of SARS-CoV-2 viral copies this assay can detect is 250  copies / mL. A negative result does not preclude SARS-CoV-2 infection  and should not be used as the sole basis for treatment or other  patient management decisions.  A negative result may occur with  improper specimen collection / handling, submission of specimen other  than nasopharyngeal swab, presence of viral mutation(s) within the  areas targeted by this assay, and inadequate number of viral copies  (<250 copies / mL). A negative result must be combined with clinical  observations, patient history, and epidemiological information. If result is POSITIVE SARS-CoV-2 target nucleic acids are DETECTED. The SARS-CoV-2 RNA is generally detectable in upper and lower  respiratory specimens dur ing the acute phase of infection.  Positive  results are indicative of active infection with SARS-CoV-2.  Clinical  correlation with patient history and other diagnostic information is  necessary to determine patient infection status.  Positive results do  not rule out bacterial infection or co-infection with other viruses. If result is PRESUMPTIVE POSTIVE SARS-CoV-2 nucleic acids MAY BE PRESENT.   A presumptive positive result was obtained on the submitted specimen  and confirmed on repeat testing.  While 2019 novel coronavirus  (SARS-CoV-2) nucleic acids may be present in the submitted sample  additional confirmatory testing may be necessary for epidemiological  and / or clinical management purposes  to differentiate between  SARS-CoV-2 and other Sarbecovirus currently known to infect humans.  If clinically indicated additional testing with an alternate test  methodology [phone]) is advised. The SARS-CoV-2 RNA is generally  detectable  in upper and lower respiratory sp ecimens during the acute  phase of infection. The expected result is Negative. Fact Sheet for Patients:  StrictlyIdeas.no Fact Sheet for Healthcare Providers: BankingDealers.co.za This test is not yet approved or cleared by the [place] FDA and has been authorized for detection and/or diagnosis of SARS-CoV-2 by FDA under an Emergency Use Authorization (EUA).  This EUA will remain in effect (meaning this test can be used) for the duration of the COVID-19 declaration under Section 564(b)(1) of the Act, 21 U.S.C. section 360bbb-3(b)(1), unless the authorization is terminated or revoked sooner. Performed at [facility], [address] [place], [place] [address]   MRSA PCR Screening     Status: None   Collection Time: 04/01/19 10:58 AM   Specimen: Nasal Mucosa; Nasopharyngeal  Result Value Ref Range Status   MRSA by PCR NEGATIVE NEGATIVE Final    Comment:        The GeneXpert MRSA Assay (FDA approved for NASAL specimens only), is one component of a comprehensive MRSA colonization surveillance program. It is not intended to diagnose MRSA infection nor to guide or monitor treatment for MRSA infections. Performed at [facility], [address] [place], [place] [address]   Culture, respiratory (non-expectorated)     Status: None (Preliminary result)   Collection Time: 04/01/19 12:50 PM   Specimen: Tracheal Aspirate; Respiratory  Result Value Ref Range Status   Specimen Description   Final    TRACHEAL ASPIRATE Performed at [facility], [address] [place], [place] [address]    Special Requests   Final    NONE Performed at [facility], [address] [place], [place] [address]    Gram Stain   Final    MODERATE WBC PRESENT, PREDOMINANTLY PMN FEW SQUAMOUS EPITHELIAL CELLS PRESENT ABUNDANT GRAM POSITIVE COCCI RARE GRAM POSITIVE RODS Performed at  [facility], [place] [address] [place], [place] [address]    Culture PENDING  Incomplete   Report Status PENDING  Incomplete          IMAGING    Dg Chest 1 View  Result Date: 04/01/2019 CLINICAL DATA:  New right-sided central line placement. EXAM: CHEST  1 VIEW COMPARISON:  None. FINDINGS: The heart size is exaggerated by low lung volumes. Endotracheal tube terminates 4 cm above the carina. Right IJ line is new. There is no pneumothorax. Lung volumes are low. Left greater than right basilar airspace opacities are present. Moderate pulmonary vascular congestion is again noted. IMPRESSION: 1. New right IJ line without radiographic evidence for complication. 2. Low lung volumes and moderate pulmonary vascular congestion. 3. Left greater than right basilar opacities again noted. Electronically Signed   By: San Morelle M.D.   On: 04/01/2019 08:53   Dg Abd Portable 1v  Result Date: 04/01/2019 CLINICAL DATA:  Evaluate NG tube EXAM: PORTABLE ABDOMEN - 1 VIEW COMPARISON:  None. FINDINGS: The NG tube terminates in the stomach. No other acute abnormalities. IMPRESSION: The NG tube terminates in the stomach. Electronically Signed   By: Dorise Bullion III M.D   On: 04/01/2019 17:15       Indwelling Urinary Catheter continued, requirement due to   Reason to continue Indwelling Urinary Catheter strict Intake/Output monitoring for hemodynamic instability   Central  Line/ continued, requirement due to  Reason to continue [employer] of central venous pressure or other hemodynamic parameters and poor IV access   Ventilator continued, requirement due to severe respiratory failure   Ventilator Sedation RASS 0 to -2      ASSESSMENT AND PLAN SYNOPSIS  43 yo morbidly obese AAM with severe DKA with severe metabolic acidosis with severe resp failure and seizures +aspiration pneumonia and severe acute renal failure  Severe ACUTE Hypoxic and Hypercapnic Respiratory  Failure -continue Full MV support -continue Bronchodilator Therapy -Wean Fio2 and PEEP as tolerated -will perform SAT/SBT when respiratory parameters are met  ACUTE KIDNEY INJURY/Renal Failure -follow chem 7 -follow UO -continue Foley Catheter-assess need -Avoid nephrotoxic agents -Recheck creatinine    NEUROLOGY - intubated and sedated - minimal sedation to achieve a RASS goal: -1 EEG shows no seizures Wake up assessment pending   SHOCK-SEPSIS/HYPOVOLUMIC -use vasopressors to keep MAP>65 if needed -follow ABG and LA -follow up cultures -emperic ABX -aggressive IV fluid resuscitation  CARDIAC ICU monitoring  ID -continue IV abx as prescibed -follow up cultures  GI GI PROPHYLAXIS as indicated  NUTRITIONAL STATUS DIET-->TF's as tolerated Constipation protocol as indicated  ENDO - will use ICU hypoglycemic\Hyperglycemia protocol if indicated   ELECTROLYTES -follow labs as needed -replace as needed -pharmacy consultation and following   DVT/GI PRX ordered TRANSFUSIONS AS NEEDED MONITOR FSBS ASSESS the need for LABS as needed   Critical Care Time devoted to patient care services described in this note is 35 minutes.   Overall, patient is critically ill, prognosis is guarded.  Patient with Multiorgan failure and at high risk for cardiac arrest and death.    Corrin Parker, M.D.  [facility]  Medical Director [place] Director [facility] Cardio-Pulmonary Department

## 2019-04-02 NOTE — Consult Note (Signed)
[facility] CONSULT NOTE    Date: 04/02/2019                  Patient Name:  Roy Oconnell  MRN: [id number]  DOB: 06/13/1976  Age / Sex: 43 y.o., male         PCP: Gennaro Africa, MD                 Service Requesting Consult:  Critical care                 Reason for Consult:  Acute renal failure            History of Present Illness: Patient is a 43 y.o. male with a PMHx of diabetes mellitus type 2, hypertension, hyperlipidemia, prior history of kidney donation, chronic kidney disease stage III baseline creatinine 1.5 on August 27, 2018, who was admitted to [facility] on 04/01/2019 for evaluation of seizures and loss of consciousness.  Patient unable to provide any history at this point time as he is currently intubated and sedated.  Apparently his roommate witnessed him having seizures.  Patient was given Narcan with no response.  He was subsequently intubated upon arrival to the emergency department here.  Upon arrival here his initial creatinine was found to be 2.30 with a serum bicarbonate of 9.  His glucose was also very high at 739.  Renal function has deteriorated and creatinine now up to 5.03 and he is making very little urine.  Serum bicarbonate up to 22.  His initial lactic acid was also very high at 11.     Medications: Outpatient medications: Medications Prior to Admission  Medication Sig Dispense Refill Last Dose  . amLODipine (NORVASC) 10 MG tablet Take 10 mg by mouth daily.   unknown at unknown  . aspirin EC 81 MG tablet Take 81 mg by mouth daily.   unknown at unknown  . atorvastatin (LIPITOR) 20 MG tablet Take 20 mg by mouth daily.   unknown at unknown  . chlorthalidone (HYGROTON) 25 MG tablet Take 25 mg by mouth daily.   unknown at unknown  . glipiZIDE (GLUCOTROL) 10 MG tablet Take 10 mg by mouth 2 (two) times a day.   unknown at unknown  . lisinopril (ZESTRIL) 40 MG tablet Take 40 mg by mouth daily.   unknown at unknown  . metoprolol  succinate (TOPROL-XL) 50 MG 24 hr tablet Take 50 mg by mouth daily.   unknown at unknown  . spironolactone (ALDACTONE) 25 MG tablet Take 25 mg by mouth daily.   unknown at unknown    Current medications: Current Facility-Administered Medications  Medication Dose Route Frequency Provider Last Rate Last Dose  . acetaminophen (TYLENOL) tablet 650 mg  650 mg Oral Q6H PRN Hillary Bow, MD       Or  . acetaminophen (TYLENOL) suppository 650 mg  650 mg Rectal Q6H PRN Sudini, Srikar, MD      . albuterol (PROVENTIL) (2.5 MG/3ML) 0.083% nebulizer solution 2.5 mg  2.5 mg Nebulization Q2H PRN Sudini, Srikar, MD      . chlorhexidine gluconate (MEDLINE KIT) (PERIDEX) 0.12 % solution 15 mL  15 mL Mouth Rinse BID Tukov-Yual, Magdalene S, NP   15 mL at 04/02/19 0804  . Chlorhexidine Gluconate Cloth 2 % PADS 6 each  6 each Topical Daily Tukov-Yual, Magdalene S, NP   6 each at 04/02/19 1103  . feeding supplement (PRO-STAT SUGAR FREE 64) liquid 30 mL  30 mL  Per Tube BID Flora Lipps, MD   30 mL at 04/02/19 1448  . feeding supplement (VITAL HIGH PROTEIN) liquid 1,000 mL  1,000 mL Per Tube Q24H Flora Lipps, MD   1,000 mL at 04/02/19 1448  . fentaNYL 2530mg in NS 2559m(1073mml) infusion-PREMIX  0-400 mcg/hr Intravenous Continuous SunPaulette BlanchD 17.5 mL/hr at 04/02/19 1400 175 mcg/hr at 04/02/19 1400  . heparin injection 1,000-6,000 Units  1,000-6,000 Units CRRT PRN Maydell Knoebel, MD      . insulin aspart (novoLOG) injection 0-15 Units  0-15 Units Subcutaneous Q4H Kasa, Kurian, MD      . insulin aspart (novoLOG) injection 0-9 Units  0-9 Units Subcutaneous Q4H BlaAwilda BillP   5 Units at 04/02/19 1225  . insulin glargine (LANTUS) injection 20 Units  20 Units Subcutaneous BID BlaAwilda BillP   20 Units at 04/02/19 1104  . levETIRAcetam (KEPPRA) 750 mg in sodium chloride 0.9 % 100 mL IVPB  750 mg Intravenous Q12H ZeyLeotis PainD   Stopped at 04/02/19 [phone] MEDLINE mouth rinse  15 mL Mouth Rinse  10 times per day Tukov-Yual, Magdalene S, NP   15 mL at 04/02/19 1357  . norepinephrine (LEVOPHED) 16 mg in dextrose 5 % 250 mL (0.064 mg/mL) infusion  0-40 mcg/min Intravenous Titrated BlaAwilda BillP 3.75 mL/hr at 04/02/19 1400 4 mcg/min at 04/02/19 1400  . ondansetron (ZOFRAN) tablet 4 mg  4 mg Oral Q6H PRN SudHillary BowD       Or  . ondansetron (ZOFRAN) injection 4 mg  4 mg Intravenous Q6H PRN Sudini, Srikar, MD      . pantoprazole (PROTONIX) injection 40 mg  40 mg Intravenous Q24H BlaAwilda BillP   40 mg at 04/02/19 1225  . piperacillin-tazobactam (ZOSYN) IVPB 3.375 g  3.375 g Intravenous Q8H GruDallie PilesPH 12.5 mL/hr at 04/02/19 1400    . polyethylene glycol (MIRALAX / GLYCOLAX) packet 17 g  17 g Oral Daily PRN Sudini, SriAlveta HeimlichD      . propofol (DIPRIVAN) 1000 MG/100ML infusion  5-80 mcg/kg/min Intravenous Continuous SunPaulette BlanchD 30.5 mL/hr at 04/02/19 1423 40 mcg/kg/min at 04/02/19 1423  . pureflow IV solution for Dialysis   CRRT Continuous LatHolley Raringunsoor, MD 2,500 mL/hr at 04/02/19 1254    . sodium chloride flush (NS) 0.9 % injection 10-40 mL  10-40 mL Intracatheter Q12H Tukov-Yual, Magdalene S, NP   30 mL at 04/02/19 0015  . sodium chloride flush (NS) 0.9 % injection 10-40 mL  10-40 mL Intracatheter PRN Tukov-Yual, Magdalene S, NP      . sodium chloride flush (NS) 0.9 % injection 3 mL  3 mL Intravenous Q12H Sudini, SriAlveta HeimlichD   3 mL at 04/01/19 2240      Allergies: Not on File    Past Medical History: History reviewed. No pertinent past medical history.   Past Surgical History: History reviewed. No pertinent surgical history.   Family History: No family history on file.   Social History: Social History   Socioeconomic History  . Marital status: Unknown    Spouse name: Not on file  . Number of children: Not on file  . Years of education: Not on file  . Highest education level: Not on file  Occupational History  . Not on file  Social Needs   . Financial resource strain: Not on file  . Food insecurity    Worry: Not on file  Inability: Not on file  . Transportation needs    Medical: Not on file    Non-medical: Not on file  Tobacco Use  . Smoking status: Unknown If Ever Smoked  Substance and Sexual Activity  . Alcohol use: Not on file    Comment: unable to review  . Drug use: Not on file    Comment: unable to review  . Sexual activity: Not on file    Comment: unable to review  Lifestyle  . Physical activity    Days per week: Not on file    Minutes per session: Not on file  . Stress: Not on file  Relationships  . Social Herbalist on phone: Not on file    Gets together: Not on file    Attends religious service: Not on file    Active member of club or organization: Not on file    Attends meetings of clubs or organizations: Not on file    Relationship status: Not on file  . Intimate partner violence    Fear of current or ex partner: Not on file    Emotionally abused: Not on file    Physically abused: Not on file    Forced sexual activity: Not on file  Other Topics Concern  . Not on file  Social History Narrative  . Not on file     Review of Systems: Unable to obtain from the patient as he is currently intubated and sedated  Vital Signs: Blood pressure (!) 114/57, pulse 85, temperature 98.2 F (36.8 C), temperature source Bladder, resp. rate 18, height 5' 10"  (1.778 m), weight (!) 137 kg, SpO2 100 %.  Weight trends: Filed Weights   04/01/19 0636 04/02/19 0500  Weight: 127 kg (!) 137 kg    Physical Exam: General: Critically ill-appearing  Head: Normocephalic, atraumatic.  Eyes: Anicteric  Nose: Mucous membranes moist, not inflammed, nonerythematous.  Throat: Endotracheal tube noted to be in place  Neck: Supple, trachea midline.  Lungs:   Bilateral rhonchi, vent assisted  Heart: RRR. S1 and S2 normal without gallop, murmur, or rubs.  Abdomen:  BS normoactive. Soft, Nondistended,  non-tender.  No masses or organomegaly.  Extremities: 1+ pretibial edema.  Neurologic: Intubated, sedated  Skin: No visible rashes, scars.    Lab results: Basic Metabolic Panel: Recent Labs  Lab 04/01/19 1601 04/01/19 1701 04/02/19 0730 04/02/19 1247  NA  --  139 138 136  K  --  4.3 4.8 4.8  CL  --  104 104 103  CO2  --  22 22 22   GLUCOSE  --  240* 322* 359*  BUN  --  24* 28* 29*  CREATININE  --  3.16* 4.64* 5.03*  CALCIUM  --  8.4* 8.0* 7.9*  MG 3.4*  --   --  2.8*  PHOS  --   --   --  5.2*    Liver Function Tests: Recent Labs  Lab 04/01/19 0530 04/02/19 0730 04/02/19 1247  AST 66* 32  --   ALT 33 29  --   ALKPHOS 174* 68  --   BILITOT 0.8 0.9  --   PROT 8.2* 6.2*  --   ALBUMIN 4.6 3.4* 3.4*   Recent Labs  Lab 04/01/19 0530  LIPASE 40   No results for input(s): AMMONIA in the last 168 hours.  CBC: Recent Labs  Lab 04/01/19 0530 04/02/19 0730  WBC 26.2* 9.0  NEUTROABS 14.1*  --   HGB 13.7 10.9*  HCT 47.2 35.8*  MCV 104.7* 100.8*  PLT 414* 232    Cardiac Enzymes: Recent Labs  Lab 04/01/19 0530 04/01/19 1058 04/01/19 1601 04/01/19 2155  TROPONINI 0.06* 0.36* 0.72* 0.93*    BNP: Invalid input(s): POCBNP  CBG: Recent Labs  Lab 04/01/19 2206 04/02/19 0000 04/02/19 0340 04/02/19 0724 04/02/19 1146  GLUCAP 197* 266* 306* 296* 297*    Microbiology: Results for orders placed or performed during the hospital encounter of 04/01/19  Culture, blood (routine x 2)     Status: None (Preliminary result)   Collection Time: 04/01/19  5:30 AM   Specimen: BLOOD  Result Value Ref Range Status   Specimen Description BLOOD BLOOD LEFT HAND  Final   Special Requests   Final    BOTTLES DRAWN AEROBIC AND ANAEROBIC Blood Culture results may not be optimal due to an inadequate volume of blood received in culture bottles   Culture   Final    NO GROWTH 1 DAY Performed at [facility], [address], [place], [place] [address]    Report Status  PENDING  Incomplete  Urine culture     Status: None   Collection Time: 04/01/19  5:30 AM   Specimen: Urine, Random  Result Value Ref Range Status   Specimen Description   Final    URINE, RANDOM Performed at [facility], [address] [place], [place] [address]    Special Requests   Final    NONE Performed at [facility], [address] [place], [place] [address]    Culture   Final    NO GROWTH Performed at [facility], [place] [address], [place], [place] [address]    Report Status 04/02/2019 FINAL  Final  SARS Coronavirus 2 (CEPHEID - Performed in [place] hospital lab), Hosp Order     Status: None   Collection Time: 04/01/19  5:30 AM   Specimen: Nasopharyngeal Swab  Result Value Ref Range Status   SARS Coronavirus 2 NEGATIVE NEGATIVE Final    Comment: (NOTE) If result is NEGATIVE SARS-CoV-2 target nucleic acids are NOT DETECTED. The SARS-CoV-2 RNA is generally detectable in upper and lower  respiratory specimens during the acute phase of infection. The lowest  concentration of SARS-CoV-2 viral copies this assay can detect is 250  copies / mL. A negative result does not preclude SARS-CoV-2 infection  and should not be used as the sole basis for treatment or other  patient management decisions.  A negative result may occur with  improper specimen collection / handling, submission of specimen other  than nasopharyngeal swab, presence of viral mutation(s) within the  areas targeted by this assay, and inadequate number of viral copies  (<250 copies / mL). A negative result must be combined with clinical  observations, patient history, and epidemiological information. If result is POSITIVE SARS-CoV-2 target nucleic acids are DETECTED. The SARS-CoV-2 RNA is generally detectable in upper and lower  respiratory specimens dur ing the acute phase of infection.  Positive  results are indicative of active infection with SARS-CoV-2.  Clinical  correlation  with patient history and other diagnostic information is  necessary to determine patient infection status.  Positive results do  not rule out bacterial infection or co-infection with other viruses. If result is PRESUMPTIVE POSTIVE SARS-CoV-2 nucleic acids MAY BE PRESENT.   A presumptive positive result was obtained on the submitted specimen  and confirmed on repeat testing.  While 2019 novel coronavirus  (SARS-CoV-2) nucleic acids may be  present in the submitted sample  additional confirmatory testing may be necessary for epidemiological  and / or clinical management purposes  to differentiate between  SARS-CoV-2 and other Sarbecovirus currently known to infect humans.  If clinically indicated additional testing with an alternate test  methodology [phone]) is advised. The SARS-CoV-2 RNA is generally  detectable in upper and lower respiratory sp ecimens during the acute  phase of infection. The expected result is Negative. Fact Sheet for Patients:  StrictlyIdeas.no Fact Sheet for Healthcare Providers: BankingDealers.co.za This test is not yet approved or cleared by the [place] FDA and has been authorized for detection and/or diagnosis of SARS-CoV-2 by FDA under an Emergency Use Authorization (EUA).  This EUA will remain in effect (meaning this test can be used) for the duration of the COVID-19 declaration under Section 564(b)(1) of the Act, 21 U.S.C. section 360bbb-3(b)(1), unless the authorization is terminated or revoked sooner. Performed at [facility], [address] [place], [place] [address]   Culture, blood (routine x 2)     Status: None (Preliminary result)   Collection Time: 04/01/19  6:49 AM   Specimen: BLOOD  Result Value Ref Range Status   Specimen Description BLOOD BLOOD RIGHT HAND  Final   Special Requests   Final    BOTTLES DRAWN AEROBIC AND ANAEROBIC Blood Culture results may not be optimal due to an  excessive volume of blood received in culture bottles   Culture   Final    NO GROWTH 1 DAY Performed at [facility], [address], [place], [place] [address]    Report Status PENDING  Incomplete  MRSA PCR Screening     Status: None   Collection Time: 04/01/19 10:58 AM   Specimen: Nasal Mucosa; Nasopharyngeal  Result Value Ref Range Status   MRSA by PCR NEGATIVE NEGATIVE Final    Comment:        The GeneXpert MRSA Assay (FDA approved for NASAL specimens only), is one component of a comprehensive MRSA colonization surveillance program. It is not intended to diagnose MRSA infection nor to guide or monitor treatment for MRSA infections. Performed at [facility], [address] [place], [place] [address]   Culture, respiratory (non-expectorated)     Status: None (Preliminary result)   Collection Time: 04/01/19 12:50 PM   Specimen: Tracheal Aspirate; Respiratory  Result Value Ref Range Status   Specimen Description   Final    TRACHEAL ASPIRATE Performed at [facility], [address] [place], [place] [address]    Special Requests   Final    NONE Performed at [facility], [address] [place], [place] [address]    Gram Stain   Final    MODERATE WBC PRESENT, PREDOMINANTLY PMN FEW SQUAMOUS EPITHELIAL CELLS PRESENT ABUNDANT GRAM POSITIVE COCCI RARE GRAM POSITIVE RODS    Culture   Final    TOO YOUNG TO READ Performed at [facility], [place] [address] [place], [place] [address]    Report Status PENDING  Incomplete    Coagulation Studies: No results for input(s): LABPROT, INR in the last 72 hours.  Urinalysis: Recent Labs    04/01/19 0530  COLORURINE YELLOW*  LABSPEC 1.018  PHURINE 5.0  GLUCOSEU >=500*  HGBUR MODERATE*  BILIRUBINUR NEGATIVE  KETONESUR NEGATIVE  PROTEINUR 100*  NITRITE NEGATIVE  LEUKOCYTESUR NEGATIVE      Imaging: Dg Chest 1 View  Result Date: 04/01/2019 CLINICAL DATA:  New right-sided  central line placement. EXAM: CHEST  1 VIEW COMPARISON:  None. FINDINGS: The heart size is exaggerated by low lung volumes. Endotracheal tube terminates 4 cm above the carina. Right IJ line is new. There is no pneumothorax. Lung volumes are low. Left greater than right basilar airspace opacities are present. Moderate pulmonary vascular congestion is again noted. IMPRESSION: 1. New right IJ line without radiographic evidence for complication. 2. Low lung volumes and moderate pulmonary vascular congestion. 3. Left greater than right basilar opacities again noted. Electronically Signed   By: San Morelle M.D.   On: 04/01/2019 08:53   Dg Chest 1 View  Result Date: 04/01/2019 CLINICAL DATA:  Unresponsive. EXAM: CHEST  1 VIEW COMPARISON:  None. FINDINGS: The patient is intubated. Endotracheal tube terminates 3.4 cm above the carina. Side port of the NG tube is beyond the GE junction. The heart is enlarged. This is exaggerated by low lung volumes. Perihilar prominence is noted bilaterally. Minimal basilar airspace opacities likely reflect atelectasis. IMPRESSION: 1. Low lung volumes. 2. Cardiomegaly without failure. 3. Moderate perihilar prominence. This may be due to pulmonary vascular congestion and low lung volumes. Adenopathy or mass lesion is not excluded. 4. Bibasilar airspace opacities likely reflect atelectasis. Electronically Signed   By: San Morelle M.D.   On: 04/01/2019 06:05   Ct Head Wo Contrast  Result Date: 04/01/2019 CLINICAL DATA:  Unresponsive.  Possible seizures. EXAM: CT HEAD WITHOUT CONTRAST TECHNIQUE: Contiguous axial images were obtained from the base of the skull through the vertex without intravenous contrast. COMPARISON:  None. FINDINGS: Brain: No acute infarct, hemorrhage, or mass lesion is present. No significant white matter lesions are present. The ventricles are of normal size. No significant extraaxial fluid collection is present. The brainstem and cerebellum are  within normal limits. Vascular: No hyperdense vessel or unexpected calcification. Skull: Calvarium is intact. No focal lytic or blastic lesions are present. Remote right-sided nasal bone fracture is noted. Sinuses/Orbits: The paranasal sinuses and mastoid air cells are clear. The globes and orbits are within normal limits. IMPRESSION: 1. Normal CT appearance the brain. Electronically Signed   By: San Morelle M.D.   On: 04/01/2019 06:18   Dg Chest Port 1 View  Result Date: 04/02/2019 CLINICAL DATA:  Central line placement. Acute respiratory failure with hypoxia. Sepsis. On ventilator. EXAM: PORTABLE CHEST 1 VIEW COMPARISON:  04/01/2019 FINDINGS: A new left jugular dual-lumen central venous catheter is seen with tip overlying the distal SVC. Endotracheal tube, right jugular catheter and nasogastric tube remain in appropriate position. No pneumothorax visualized. Heart size is stable. Persistent opacity in the left retrocardiac lung base results in silhouetting of the left hemidiaphragm, and is unchanged. Right lung remains clear. IMPRESSION: 1. New left jugular dual-lumen central venous catheter in appropriate position. No evidence of pneumothorax. 2. Stable left retrocardiac atelectasis versus infiltrate. Electronically Signed   By: Earle Gell M.D.   On: 04/02/2019 11:50   Dg Abd Portable 1v  Result Date: 04/01/2019 CLINICAL DATA:  Evaluate NG tube EXAM: PORTABLE ABDOMEN - 1 VIEW COMPARISON:  None. FINDINGS: The NG tube terminates in the stomach. No other acute abnormalities. IMPRESSION: The NG tube terminates in the stomach. Electronically Signed   By: Dorise Bullion III M.D   On: 04/01/2019 17:15      Assessment & Plan: Pt is a 43 y.o. male with a PMHx of diabetes mellitus type 2, hypertension, hyperlipidemia, prior history of kidney donation, chronic kidney disease stage III baseline creatinine 1.5 on August 27, 2018, who was admitted to [facility] on  04/01/2019 for evaluation of seizures and  loss of consciousness.  1.  Acute renal failure/chronic kidney disease stage III baseline creatinine 1.5 08/27/2018 with history of renal donation.  Patient appears to have severe acute renal failure now which is most likely due to hypotension.  Patient had seizure therefore we do need to check for rhabdomyolysis.  Case discussed in depth with pulmonary/critical care.  We have decided to proceed with continuous renal replacement therapy given critical status.  Temporary dialysis catheter will be placed by pulmonary/critical care.  We will plan for 4K bath, blood flow rate 300, dialysis flow rate 2.5 L/h.  No ultrafiltration for now.  2.  Metabolic acidosis.  Patient appears to have lactic acidosis.  Could be related to hypotension as well as infection.  Acidosis should be treated with CRRT.  3.  Acute respiratory failure.  Patient maintained on the ventilator.  Continue current ventilator support.  4.  Transfer consultation.

## 2019-04-02 NOTE — Progress Notes (Signed)
[facility] [place] NAME: Roy Oconnell    MR#:  [id number]  DATE OF BIRTH:  Jul 15, 1976  SUBJECTIVE:  CHIEF COMPLAINT:   Chief Complaint  Patient presents with  . Seizures  . Loss of Consciousness   Patient is on ventilation and sedation. REVIEW OF SYSTEMS:  Review of Systems  Unable to perform ROS: Intubated    DRUG ALLERGIES:  Not on File VITALS:  Blood pressure 124/61, pulse 95, temperature 98.1 F (36.7 C), temperature source Bladder, resp. rate 18, height 5\' 10"  (1.778 m), weight (!) 137 kg, SpO2 100 %. PHYSICAL EXAMINATION:  Physical Exam Constitutional:      Comments: Morbid obesity.  On ventilation.  HENT:     Head: Normocephalic.  Eyes:     General: No scleral icterus.    Conjunctiva/sclera: Conjunctivae normal.     Pupils: Pupils are equal, round, and reactive to light.  Neck:     Musculoskeletal: Neck supple.     Vascular: No JVD.     Trachea: No tracheal deviation.  Cardiovascular:     Rate and Rhythm: Normal rate and regular rhythm.     Heart sounds: Normal heart sounds. No murmur. No gallop.   Pulmonary:     Effort: Pulmonary effort is normal. No respiratory distress.     Breath sounds: Normal breath sounds. No wheezing or rales.  Abdominal:     General: Bowel sounds are normal. There is no distension.     Palpations: Abdomen is soft.     Tenderness: There is no abdominal tenderness. There is no rebound.  Musculoskeletal:        General: No tenderness.  Skin:    Findings: No erythema or rash.    LABORATORY PANEL:  Male CBC Recent Labs  Lab 04/02/19 0730  WBC 9.0  HGB 10.9*  HCT 35.8*  PLT 232   ------------------------------------------------------------------------------------------------------------------ Chemistries  Recent Labs  Lab 04/02/19 0730 04/02/19 1247  NA 138 136  K 4.8 4.8  CL 104 103  CO2 22 22  GLUCOSE 322* 359*  BUN 28* 29*  CREATININE 4.64* 5.03*  CALCIUM 8.0*  7.9*  MG  --  2.8*  AST 32  --   ALT 29  --   ALKPHOS 68  --   BILITOT 0.9  --    RADIOLOGY:  Dg Chest Port 1 View  Result Date: 04/02/2019 CLINICAL DATA:  Central line placement. Acute respiratory failure with hypoxia. Sepsis. On ventilator. EXAM: PORTABLE CHEST 1 VIEW COMPARISON:  04/01/2019 FINDINGS: A new left jugular dual-lumen central venous catheter is seen with tip overlying the distal SVC. Endotracheal tube, right jugular catheter and nasogastric tube remain in appropriate position. No pneumothorax visualized. Heart size is stable. Persistent opacity in the left retrocardiac lung base results in silhouetting of the left hemidiaphragm, and is unchanged. Right lung remains clear. IMPRESSION: 1. New left jugular dual-lumen central venous catheter in appropriate position. No evidence of pneumothorax. 2. Stable left retrocardiac atelectasis versus infiltrate. Electronically Signed   By: Earle Gell M.D.   On: 04/02/2019 11:50   Dg Abd Portable 1v  Result Date: 04/01/2019 CLINICAL DATA:  Evaluate NG tube EXAM: PORTABLE ABDOMEN - 1 VIEW COMPARISON:  None. FINDINGS: The NG tube terminates in the stomach. No other acute abnormalities. IMPRESSION: The NG tube terminates in the stomach. Electronically Signed   By: Dorise Bullion III M.D   On: 04/01/2019 17:15   ASSESSMENT AND PLAN:   *DKA,  hyperglycemia and diabetes. Improved with insulin drip and IV fluid support.  Anion gap close to normal. Continue sliding scale and Lantus.  *New onset seizures.  Loaded with 1 dose of IV Keppra.     Per Dr. Irish Elders, Keppra 750 BID now for acute seizure prophylaxis for 3-5 days as likely provoked seizure.  Sepsis and aspiration pneumonia Continue IV Zosyn.  Follow-up blood culture.  Cytosis improved.  Septic shock.  Continue Levophed.  *Acute kidney injury on CKD 3 secondary to DKA, hypotension and sepsis. Patient has been treated with IV fluid, but renal function has been worsening.  CRRT per Dr.  Holley Raring.  Elevated troponin.  Possible due to demanding ischemia secondary to above.  *Acute metabolic encephalopathy due to seizures and DKA.  Presently intubated and sedated.  CT scan of the head negative.  Reassess once extubated and awake.  *Acute respiratory failure with hypoxia and hypercapnia due to above. Continue ventilation and sedation.  I discussed with Dr. Mortimer Fries. All the records are reviewed and case discussed with Care Management/Social Worker. Management plans discussed with the patient, family and they are in agreement.  CODE STATUS: Full Code  TOTAL TIME TAKING CARE OF THIS PATIENT: 28 minutes.   More than 50% of the time was spent in counseling/coordination of care: YES  POSSIBLE D/C IN ? DAYS, DEPENDING ON CLINICAL CONDITION.   [name] M.D on 04/02/2019 at 3:53 PM  Between 7am to 6pm - Pager - [phone]  After 6pm go to [email or web address] - [facility]

## 2019-04-03 ENCOUNTER — Inpatient Hospital Stay: Payer: Self-pay

## 2019-04-03 LAB — TROPONIN I
Troponin I: 0.42 ng/mL (ref <0.03)
Troponin I: 0.46 ng/mL (ref <0.03)

## 2019-04-03 LAB — RENAL FUNCTION PANEL
Albumin: 2.9 g/dL — ABNORMAL LOW (ref 3.5–5.0)
Albumin: 3 g/dL — ABNORMAL LOW (ref 3.5–5.0)
Albumin: 3.1 g/dL — ABNORMAL LOW (ref 3.5–5.0)
Albumin: 3.1 g/dL — ABNORMAL LOW (ref 3.5–5.0)
Albumin: 3.2 g/dL — ABNORMAL LOW (ref 3.5–5.0)
Albumin: 3.3 g/dL — ABNORMAL LOW (ref 3.5–5.0)
Anion gap: 11 (ref 5–15)
Anion gap: 12 (ref 5–15)
Anion gap: 12 (ref 5–15)
Anion gap: 12 (ref 5–15)
Anion gap: 12 (ref 5–15)
Anion gap: 12 (ref 5–15)
BUN: 24 mg/dL — ABNORMAL HIGH (ref 6–20)
BUN: 26 mg/dL — ABNORMAL HIGH (ref 6–20)
BUN: 30 mg/dL — ABNORMAL HIGH (ref 6–20)
BUN: 31 mg/dL — ABNORMAL HIGH (ref 6–20)
BUN: 32 mg/dL — ABNORMAL HIGH (ref 6–20)
BUN: 33 mg/dL — ABNORMAL HIGH (ref 6–20)
CO2: 21 mmol/L — ABNORMAL LOW (ref 22–32)
CO2: 22 mmol/L (ref 22–32)
CO2: 22 mmol/L (ref 22–32)
CO2: 22 mmol/L (ref 22–32)
CO2: 22 mmol/L (ref 22–32)
CO2: 23 mmol/L (ref 22–32)
Calcium: 7.8 mg/dL — ABNORMAL LOW (ref 8.9–10.3)
Calcium: 7.9 mg/dL — ABNORMAL LOW (ref 8.9–10.3)
Calcium: 8 mg/dL — ABNORMAL LOW (ref 8.9–10.3)
Calcium: 8 mg/dL — ABNORMAL LOW (ref 8.9–10.3)
Calcium: 8.3 mg/dL — ABNORMAL LOW (ref 8.9–10.3)
Calcium: 8.3 mg/dL — ABNORMAL LOW (ref 8.9–10.3)
Chloride: 100 mmol/L (ref 98–111)
Chloride: 100 mmol/L (ref 98–111)
Chloride: 101 mmol/L (ref 98–111)
Chloride: 103 mmol/L (ref 98–111)
Chloride: 98 mmol/L (ref 98–111)
Chloride: 99 mmol/L (ref 98–111)
Creatinine, Ser: 4.38 mg/dL — ABNORMAL HIGH (ref 0.61–1.24)
Creatinine, Ser: 4.86 mg/dL — ABNORMAL HIGH (ref 0.61–1.24)
Creatinine, Ser: 5.37 mg/dL — ABNORMAL HIGH (ref 0.61–1.24)
Creatinine, Ser: 5.57 mg/dL — ABNORMAL HIGH (ref 0.61–1.24)
Creatinine, Ser: 5.73 mg/dL — ABNORMAL HIGH (ref 0.61–1.24)
Creatinine, Ser: 5.98 mg/dL — ABNORMAL HIGH (ref 0.61–1.24)
GFR calc Af Amer: 12 mL/min — ABNORMAL LOW (ref >60)
GFR calc Af Amer: 13 mL/min — ABNORMAL LOW (ref >60)
GFR calc Af Amer: 14 mL/min — ABNORMAL LOW (ref >60)
GFR calc Af Amer: 14 mL/min — ABNORMAL LOW (ref >60)
GFR calc Af Amer: 16 mL/min — ABNORMAL LOW (ref >60)
GFR calc Af Amer: 18 mL/min — ABNORMAL LOW (ref >60)
GFR calc non Af Amer: 11 mL/min — ABNORMAL LOW (ref >60)
GFR calc non Af Amer: 11 mL/min — ABNORMAL LOW (ref >60)
GFR calc non Af Amer: 12 mL/min — ABNORMAL LOW (ref >60)
GFR calc non Af Amer: 12 mL/min — ABNORMAL LOW (ref >60)
GFR calc non Af Amer: 14 mL/min — ABNORMAL LOW (ref >60)
GFR calc non Af Amer: 16 mL/min — ABNORMAL LOW (ref >60)
Glucose, Bld: 264 mg/dL — ABNORMAL HIGH (ref 70–99)
Glucose, Bld: 284 mg/dL — ABNORMAL HIGH (ref 70–99)
Glucose, Bld: 312 mg/dL — ABNORMAL HIGH (ref 70–99)
Glucose, Bld: 364 mg/dL — ABNORMAL HIGH (ref 70–99)
Glucose, Bld: 365 mg/dL — ABNORMAL HIGH (ref 70–99)
Glucose, Bld: 372 mg/dL — ABNORMAL HIGH (ref 70–99)
Phosphorus: 3.5 mg/dL (ref 2.5–4.6)
Phosphorus: 3.6 mg/dL (ref 2.5–4.6)
Phosphorus: 3.8 mg/dL (ref 2.5–4.6)
Phosphorus: 3.9 mg/dL (ref 2.5–4.6)
Phosphorus: 3.9 mg/dL (ref 2.5–4.6)
Phosphorus: 4.5 mg/dL (ref 2.5–4.6)
Potassium: 4 mmol/L (ref 3.5–5.1)
Potassium: 4 mmol/L (ref 3.5–5.1)
Potassium: 4.1 mmol/L (ref 3.5–5.1)
Potassium: 4.1 mmol/L (ref 3.5–5.1)
Potassium: 4.2 mmol/L (ref 3.5–5.1)
Potassium: 4.3 mmol/L (ref 3.5–5.1)
Sodium: 131 mmol/L — ABNORMAL LOW (ref 135–145)
Sodium: 133 mmol/L — ABNORMAL LOW (ref 135–145)
Sodium: 133 mmol/L — ABNORMAL LOW (ref 135–145)
Sodium: 135 mmol/L (ref 135–145)
Sodium: 135 mmol/L (ref 135–145)
Sodium: 137 mmol/L (ref 135–145)

## 2019-04-03 LAB — BLOOD GAS, ARTERIAL
Acid-base deficit: 2.8 mmol/L — ABNORMAL HIGH (ref 0.0–2.0)
Bicarbonate: 23.2 mmol/L (ref 20.0–28.0)
FIO2: 0.4
MECHVT: 500 mL
O2 Saturation: 99.2 %
PEEP: 8 cmH2O
Patient temperature: 37
RATE: 25 resp/min
pCO2 arterial: 44 mmHg (ref 32.0–48.0)
pH, Arterial: 7.33 — ABNORMAL LOW (ref 7.350–7.450)
pO2, Arterial: 153 mmHg — ABNORMAL HIGH (ref 83.0–108.0)

## 2019-04-03 LAB — GLUCOSE, CAPILLARY
Glucose-Capillary: 263 mg/dL — ABNORMAL HIGH (ref 70–99)
Glucose-Capillary: 333 mg/dL — ABNORMAL HIGH (ref 70–99)
Glucose-Capillary: 368 mg/dL — ABNORMAL HIGH (ref 70–99)
Glucose-Capillary: 279 mg/dL — ABNORMAL HIGH (ref 70–99)
Glucose-Capillary: 260 mg/dL — ABNORMAL HIGH (ref 70–99)

## 2019-04-03 LAB — CBC
HCT: 29.8 % — ABNORMAL LOW (ref 39.0–52.0)
Hemoglobin: 9.5 g/dL — ABNORMAL LOW (ref 13.0–17.0)
MCH: 31.6 pg (ref 26.0–34.0)
MCHC: 31.9 g/dL (ref 30.0–36.0)
MCV: 99 fL (ref 80.0–100.0)
Platelets: 163 10*3/uL (ref 150–400)
RBC: 3.01 MIL/uL — ABNORMAL LOW (ref 4.22–5.81)
RDW: 13.9 % (ref 11.5–15.5)
WBC: 8.5 10*3/uL (ref 4.0–10.5)
nRBC: 0.9 % — ABNORMAL HIGH (ref 0.0–0.2)

## 2019-04-03 LAB — MAGNESIUM
Magnesium: 2 mg/dL (ref 1.7–2.4)
Magnesium: 2.3 mg/dL (ref 1.7–2.4)
Magnesium: 2.3 mg/dL (ref 1.7–2.4)
Magnesium: 2.5 mg/dL — ABNORMAL HIGH (ref 1.7–2.4)
Magnesium: 2.5 mg/dL — ABNORMAL HIGH (ref 1.7–2.4)
Magnesium: 2.6 mg/dL — ABNORMAL HIGH (ref 1.7–2.4)

## 2019-04-03 LAB — ECHOCARDIOGRAM COMPLETE
Height: 70 in
Weight: 4832.48 oz

## 2019-04-03 LAB — CK: Total CK: 894 U/L — ABNORMAL HIGH (ref 49–397)

## 2019-04-03 MED ORDER — PRO-STAT SUGAR FREE PO LIQD
60.0000 mL | Freq: Every day | ORAL | Status: DC
  Administered 2019-04-03 – 2019-04-05 (×10): 60 mL

## 2019-04-03 MED ORDER — ALTEPLASE 2 MG IJ SOLR
2.0000 mg | Freq: Once | INTRAMUSCULAR | Status: AC
  Administered 2019-04-03: 2 mg

## 2019-04-03 MED ORDER — STERILE WATER FOR INJECTION IJ SOLN
INTRAMUSCULAR | Status: AC
  Administered 2019-04-03: 20 mL
  Filled 2019-04-03: qty 20

## 2019-04-03 MED ORDER — B COMPLEX-C PO TABS
1.0000 | ORAL_TABLET | Freq: Every day | ORAL | Status: DC
  Administered 2019-04-03 – 2019-04-04 (×2): 1
  Filled 2019-04-03 (×3): qty 1

## 2019-04-03 MED ORDER — LEVETIRACETAM IN NACL 500 MG/100ML IV SOLN
500.0000 mg | Freq: Two times a day (BID) | INTRAVENOUS | Status: DC
  Administered 2019-04-03 – 2019-04-04 (×2): 500 mg via INTRAVENOUS
  Filled 2019-04-03 (×4): qty 100

## 2019-04-03 MED ORDER — MIDAZOLAM HCL 2 MG/2ML IJ SOLN
2.0000 mg | Freq: Once | INTRAMUSCULAR | Status: AC
  Administered 2019-04-03: 2 mg via INTRAVENOUS

## 2019-04-03 MED ORDER — VITAL HIGH PROTEIN PO LIQD
1000.0000 mL | ORAL | Status: DC
  Administered 2019-04-03 – 2019-04-04 (×2): 1000 mL

## 2019-04-03 MED ORDER — MIDAZOLAM HCL 2 MG/2ML IJ SOLN
INTRAMUSCULAR | Status: AC
  Administered 2019-04-03: 15:00:00 2 mg via INTRAVENOUS
  Filled 2019-04-03: qty 2

## 2019-04-03 NOTE — Progress Notes (Signed)
Inpatient Diabetes Program Recommendations  AACE/ADA: New Consensus Statement on Inpatient Glycemic Control (2015)  Target Ranges:  Prepandial:   less than 140 mg/dL      Peak postprandial:   less than 180 mg/dL (1-2 hours)      Critically ill patients:  140 - 180 mg/dL   Lab Results  Component Value Date   GLUCAP 333 (H) 04/03/2019   HGBA1C 14.8 (H) 04/01/2019    Review of Glycemic Control Results for CONNIE, HILGERT (MRN [id number]) as of 04/03/2019 10:05  Ref. Range 04/03/2019 05:12 04/03/2019 07:18  Glucose-Capillary Latest Ref Range: 70 - 99 mg/dL 263 (H) 333 (H)  Diabetes history: DM 2 Outpatient Diabetes medications: Glucotrol 10 mg bid Current orders for Inpatient glycemic control: Lantus 20 units bid Novolog sensitive q 4 hours Inpatient Diabetes Program Recommendations:    Blood sugars remain difficult to control and> goal.  Consider restarting IV insulin- ICU glycemic control order set.   Thanks,  Adah Perl, RN, BC-ADM Inpatient Diabetes Coordinator Pager [phone] (8a-5p)

## 2019-04-03 NOTE — Progress Notes (Signed)
CRRT re-started at this time. Blood flow decreased to 250 due to very low access pressures.

## 2019-04-03 NOTE — Progress Notes (Signed)
Pt's systolic in the 859'M at this time, Dr. Mortimer Fries aware. Levophed was turned off at approximately 1200 today. No new orders at this time.

## 2019-04-03 NOTE — Progress Notes (Signed)
CRRT clotted off at 3:30 AM.  Pt's dialysis catheter clotted off and cath flo was instilled in both ports at 4:00AM.  Each dialysis port contains 1.4cc of cath flo and the lines are labeled.  Issues with the lab draws also occurred at this time.  Labs were drawn twice off of the right IJ central line and both times the samples hemolyzed.  Lab tech came up and drew the labs peripherally and they too hemolyzed.  Currently another lab tech is attempting to draw morning labs.  Will wait for results.  NP is aware.

## 2019-04-03 NOTE — Consult Note (Signed)
Pharmacy Antibiotic Note  Roy Oconnell is a 43 y.o. male admitted on 04/01/2019 with sepsis/DKA.  Pharmacy has was originally consulted for vancomycin and cefepime dosing but is now being changed over to Zosyn. His renal function is significantly worse than baseline: SCr 5.03 (baseline ~1.5). He received a total of 2500 mg IV vancomycin in the ED. The source of his sepsis is suspeccted to be from aspiration PNA. Starting CRRT.   Plan: Continue Zosyn 3.375 g IV every 8 hours (EI)  Height: 5\' 10"  (177.8 cm) Weight: (!) 302 lb 0.5 oz (137 kg) IBW/kg (Calculated) : 73  Temp (24hrs), Avg:98.9 F (37.2 C), Min:97.9 F (36.6 C), Max:100 F (37.8 C)  Recent Labs  Lab 04/01/19 0530 04/01/19 0818  04/01/19 1206  04/02/19 0730  04/02/19 1610 04/02/19 2029 04/03/19 0524 04/03/19 0738 04/03/19 1149  WBC 26.2*  --   --   --   --  9.0  --   --   --  8.5  --   --   CREATININE 2.30*  --    < >  --    < > 4.64*   < > 4.63* 4.48* 5.37* 5.73* 5.98*  LATICACIDVEN >11.0* 10.5*  --  7.2*  --   --   --   --  1.4  --   --   --    < > = values in this interval not displayed.    Estimated Creatinine Clearance: 22.7 mL/min (A) (by C-G formula based on SCr of 5.98 mg/dL (H)).    Not on File  Antimicrobials this admission: Zosyn 6/12 >> vancomycin 6/12 x2  cefepime 6/12 x1  Microbiology results: 6/12 BCx: pending 6/12 UCx: pending  6/12 MRSA PCR: negative  Thank you for allowing pharmacy to be a part of this patient's care.  Oswald Hillock, PharmD, BCPS 04/03/2019 1:26 PM

## 2019-04-03 NOTE — Progress Notes (Signed)
[facility]  ROUNDING NOTE   Subjective:  Patient remains critically ill at the moment. Still on the ventilator. Has been tolerating CRRT well thus far. Urine output was only 140 cc over the preceding 24 hours.  Objective:  Vital signs in last 24 hours:  Temp:  [97.9 F (36.6 C)-100 F (37.8 C)] 100 F (37.8 C) (06/14 1000) Pulse Rate:  [70-101] 80 (06/14 1000) Resp:  [0-32] 0 (06/14 1000) BP: (91-152)/(45-88) 150/88 (06/14 1000) SpO2:  [97 %-100 %] 100 % (06/14 1130) FiO2 (%):  [30 %-45 %] 30 % (06/14 1130)  Weight change:  Filed Weights   04/01/19 0636 04/02/19 0500  Weight: 127 kg (!) 137 kg    Intake/Output: I/O last 3 completed shifts: In: 4815.5 [I.V.:3877.7; NG/GT:248; IV Piggyback:689.8] Out: 290 [Urine:290]   Intake/Output this shift:  Total I/O In: 290.3 [I.V.:253.6; IV Piggyback:36.7] Out: -   Physical Exam: General: Critically ill-appearing  Head: Endotracheal tube in place  Eyes: Anicteric  Neck: Supple, trachea midline  Lungs:   Bilateral rales and rhonchi, vent assisted  Heart: S1S2 no rubs  Abdomen:  Soft, nontender, bowel sounds present  Extremities: 1+ peripheral edema.  Neurologic: Intubated, sedated  Skin: No lesions  Access: L IJ temporary dialysis catheter    Basic Metabolic Panel: Recent Labs  Lab 04/02/19 1247 04/02/19 1610 04/02/19 2029 04/03/19 0524 04/03/19 0738 04/03/19 1149  NA 136 138 136 135 133* 133*  K 4.8 4.6 4.4 4.1 4.1 4.0  CL 103 103 102 101 100 99  CO2 _0 GLUCOSE 359* 297* 320* 364* 372* 365*  BUN 29* 27* 24* 26* 30* 32*  CREATININE 5.03* 4.63* 4.48* 5.37* 5.73* 5.98*  CALCIUM 7.9* 8.0* 8.0* 8.0* 8.0* 8.3*  MG 2.8* 2.7* 2.5* 2.5* 2.6*  --   PHOS 5.2* 4.6 4.5 4.5 3.9 3.9    Liver Function Tests: Recent Labs  Lab 04/01/19 0530  04/02/19 0730  04/02/19 1610 04/02/19 2029 04/03/19 0524 04/03/19 0738 04/03/19 1149  AST 66*  --  32  --   --   --   --   --   --   ALT 33  --   29  --   --   --   --   --   --   ALKPHOS 174*  --  68  --   --   --   --   --   --   BILITOT 0.8  --  0.9  --   --   --   --   --   --   PROT 8.2*  --  6.2*  --   --   --   --   --   --   ALBUMIN 4.6   < > 3.4*   < > 3.6 3.5 2.9* 3.0* 3.1*   < > = values in this interval not displayed.   Recent Labs  Lab 04/01/19 0530  LIPASE 40   No results for input(s): AMMONIA in the last 168 hours.  CBC: Recent Labs  Lab 04/01/19 0530 04/02/19 0730 04/03/19 0524  WBC 26.2* 9.0 8.5  NEUTROABS 14.1*  --   --   HGB 13.7 10.9* 9.5*  HCT 47.2 35.8* 29.8*  MCV 104.7* 100.8* 99.0  PLT 414* 232 163    Cardiac Enzymes: Recent Labs  Lab 04/01/19 1601 04/01/19 2155 04/02/19 2029 04/03/19 0524 04/03/19 0738  TROPONINI 0.72* 0.93* 0.38* 0.42* 0.46*    BNP:  Invalid input(s): POCBNP  CBG: Recent Labs  Lab 04/02/19 2042 04/02/19 2325 04/03/19 0512 04/03/19 0718 04/03/19 1216  GLUCAP 311* 286* 263* 333* 368*    Microbiology: Results for orders placed or performed during the hospital encounter of 04/01/19  Culture, blood (routine x 2)     Status: None (Preliminary result)   Collection Time: 04/01/19  5:30 AM   Specimen: BLOOD  Result Value Ref Range Status   Specimen Description BLOOD BLOOD LEFT HAND  Final   Special Requests   Final    BOTTLES DRAWN AEROBIC AND ANAEROBIC Blood Culture results may not be optimal due to an inadequate volume of blood received in culture bottles   Culture   Final    NO GROWTH 2 DAYS Performed at [facility], [address], [place], [place] [address]    Report Status PENDING  Incomplete  Urine culture     Status: None   Collection Time: 04/01/19  5:30 AM   Specimen: Urine, Random  Result Value Ref Range Status   Specimen Description   Final    URINE, RANDOM Performed at [facility], [address] [place], [place] [address]    Special Requests   Final    NONE Performed at [facility], [address],  [place], [place] [address]    Culture   Final    NO GROWTH Performed at [facility], [place] [address], [place], [place] [address]    Report Status 04/02/2019 FINAL  Final  SARS Coronavirus 2 (CEPHEID - Performed in [place] hospital lab), Hosp Order     Status: None   Collection Time: 04/01/19  5:30 AM   Specimen: Nasopharyngeal Swab  Result Value Ref Range Status   SARS Coronavirus 2 NEGATIVE NEGATIVE Final    Comment: (NOTE) If result is NEGATIVE SARS-CoV-2 target nucleic acids are NOT DETECTED. The SARS-CoV-2 RNA is generally detectable in upper and lower  respiratory specimens during the acute phase of infection. The lowest  concentration of SARS-CoV-2 viral copies this assay can detect is 250  copies / mL. A negative result does not preclude SARS-CoV-2 infection  and should not be used as the sole basis for treatment or other  patient management decisions.  A negative result may occur with  improper specimen collection / handling, submission of specimen other  than nasopharyngeal swab, presence of viral mutation(s) within the  areas targeted by this assay, and inadequate number of viral copies  (<250 copies / mL). A negative result must be combined with clinical  observations, patient history, and epidemiological information. If result is POSITIVE SARS-CoV-2 target nucleic acids are DETECTED. The SARS-CoV-2 RNA is generally detectable in upper and lower  respiratory specimens dur ing the acute phase of infection.  Positive  results are indicative of active infection with SARS-CoV-2.  Clinical  correlation with patient history and other diagnostic information is  necessary to determine patient infection status.  Positive results do  not rule out bacterial infection or co-infection with other viruses. If result is PRESUMPTIVE POSTIVE SARS-CoV-2 nucleic acids MAY BE PRESENT.   A presumptive positive result was obtained on the submitted specimen  and confirmed on repeat  testing.  While 2019 novel coronavirus  (SARS-CoV-2) nucleic acids may be present in the submitted sample  additional confirmatory testing may be necessary for epidemiological  and / or clinical management purposes  to differentiate between  SARS-CoV-2 and other Sarbecovirus currently known to infect humans.  If clinically  indicated additional testing with an alternate test  methodology [id number]) is advised. The SARS-CoV-2 RNA is generally  detectable in upper and lower respiratory sp ecimens during the acute  phase of infection. The expected result is Negative. Fact Sheet for Patients:  StrictlyIdeas.no Fact Sheet for Healthcare Providers: BankingDealers.co.za This test is not yet approved or cleared by the [place] FDA and has been authorized for detection and/or diagnosis of SARS-CoV-2 by FDA under an Emergency Use Authorization (EUA).  This EUA will remain in effect (meaning this test can be used) for the duration of the COVID-19 declaration under Section 564(b)(1) of the Act, 21 U.S.C. section 360bbb-3(b)(1), unless the authorization is terminated or revoked sooner. Performed at [facility], [address] [place], [place] [address]   Culture, blood (routine x 2)     Status: None (Preliminary result)   Collection Time: 04/01/19  6:49 AM   Specimen: BLOOD  Result Value Ref Range Status   Specimen Description BLOOD BLOOD RIGHT HAND  Final   Special Requests   Final    BOTTLES DRAWN AEROBIC AND ANAEROBIC Blood Culture results may not be optimal due to an excessive volume of blood received in culture bottles   Culture   Final    NO GROWTH 2 DAYS Performed at [facility], [address] [place], [place] [address]    Report Status PENDING  Incomplete  MRSA PCR Screening     Status: None   Collection Time: 04/01/19 10:58 AM   Specimen: Nasal Mucosa; Nasopharyngeal  Result Value Ref Range Status   MRSA by  PCR NEGATIVE NEGATIVE Final    Comment:        The GeneXpert MRSA Assay (FDA approved for NASAL specimens only), is one component of a comprehensive MRSA colonization surveillance program. It is not intended to diagnose MRSA infection nor to guide or monitor treatment for MRSA infections. Performed at [facility], [address] [place], [place] [address]   Culture, respiratory (non-expectorated)     Status: None (Preliminary result)   Collection Time: 04/01/19 12:50 PM   Specimen: Tracheal Aspirate; Respiratory  Result Value Ref Range Status   Specimen Description   Final    TRACHEAL ASPIRATE Performed at [facility], [address] [place], [place] [address]    Special Requests   Final    NONE Performed at [facility], [address] [place], [place] [address]    Gram Stain   Final    MODERATE WBC PRESENT, PREDOMINANTLY PMN FEW SQUAMOUS EPITHELIAL CELLS PRESENT ABUNDANT GRAM POSITIVE COCCI RARE GRAM POSITIVE RODS    Culture   Final    CULTURE REINCUBATED FOR BETTER GROWTH Performed at [facility], [place] [address] [place], [place] [address]    Report Status PENDING  Incomplete    Coagulation Studies: No results for input(s): LABPROT, INR in the last 72 hours.  Urinalysis: Recent Labs    04/01/19 0530  COLORURINE YELLOW*  LABSPEC 1.018  PHURINE 5.0  GLUCOSEU >=500*  HGBUR MODERATE*  BILIRUBINUR NEGATIVE  KETONESUR NEGATIVE  PROTEINUR 100*  NITRITE NEGATIVE  LEUKOCYTESUR NEGATIVE      Imaging: Dg Chest Port 1 View  Result Date: 04/03/2019 CLINICAL DATA:  Acute respiratory failure. EXAM: PORTABLE CHEST 1 VIEW COMPARISON:  04/02/2019 FINDINGS: Endotracheal tube terminates 4.5 cm above the carina. Bilateral jugular catheters terminate over the SVC, unchanged. Enteric tube courses into the abdomen with tip not imaged. The cardiomediastinal silhouette is unchanged. Retrocardiac  opacity in the left lung base has  decreased. No overt pulmonary edema, large pleural effusion, or pneumothorax is identified. IMPRESSION: Improved left basilar aeration. Electronically Signed   By: Logan Bores M.D.   On: 04/03/2019 07:30   Dg Chest Port 1 View  Result Date: 04/02/2019 CLINICAL DATA:  Central line placement. Acute respiratory failure with hypoxia. Sepsis. On ventilator. EXAM: PORTABLE CHEST 1 VIEW COMPARISON:  04/01/2019 FINDINGS: A new left jugular dual-lumen central venous catheter is seen with tip overlying the distal SVC. Endotracheal tube, right jugular catheter and nasogastric tube remain in appropriate position. No pneumothorax visualized. Heart size is stable. Persistent opacity in the left retrocardiac lung base results in silhouetting of the left hemidiaphragm, and is unchanged. Right lung remains clear. IMPRESSION: 1. New left jugular dual-lumen central venous catheter in appropriate position. No evidence of pneumothorax. 2. Stable left retrocardiac atelectasis versus infiltrate. Electronically Signed   By: Earle Gell M.D.   On: 04/02/2019 11:50   Dg Abd Portable 1v  Result Date: 04/01/2019 CLINICAL DATA:  Evaluate NG tube EXAM: PORTABLE ABDOMEN - 1 VIEW COMPARISON:  None. FINDINGS: The NG tube terminates in the stomach. No other acute abnormalities. IMPRESSION: The NG tube terminates in the stomach. Electronically Signed   By: Dorise Bullion III M.D   On: 04/01/2019 17:15     Medications:   . fentaNYL infusion INTRAVENOUS 250 mcg/hr (04/03/19 1000)  . levETIRAcetam Stopped (04/03/19 0523)  . norepinephrine (LEVOPHED) Adult infusion 2 mcg/min (04/03/19 7121)  . piperacillin-tazobactam (ZOSYN)  IV Stopped (04/03/19 0936)  . propofol (DIPRIVAN) infusion 50 mcg/kg/min (04/03/19 1000)  . pureflow Stopped (04/03/19 0330)   . B-complex with vitamin C  1 tablet Per Tube Daily  . chlorhexidine gluconate (MEDLINE KIT)  15 mL Mouth Rinse BID  . Chlorhexidine Gluconate Cloth  6 each Topical Daily  . feeding  supplement (PRO-STAT SUGAR FREE 64)  60 mL Per Tube 5 X Daily  . feeding supplement (VITAL HIGH PROTEIN)  1,000 mL Per Tube Q24H  . insulin aspart  0-15 Units Subcutaneous Q4H  . insulin glargine  20 Units Subcutaneous BID  . mouth rinse  15 mL Mouth Rinse 10 times per day  . pantoprazole (PROTONIX) IV  40 mg Intravenous Q24H  . sodium chloride flush  10-40 mL Intracatheter Q12H  . sodium chloride flush  3 mL Intravenous Q12H   acetaminophen **OR** acetaminophen, albuterol, heparin, ondansetron **OR** ondansetron (ZOFRAN) IV, polyethylene glycol, sodium chloride flush, vecuronium  Assessment/ Plan:  43 y.o. male with a PMHx of diabetes mellitus type 2, hypertension, hyperlipidemia, prior history of kidney donation, chronic kidney disease stage III baseline creatinine 1.5 on August 27, 2018, who was admitted to [facility] on 04/01/2019 for evaluation of seizures and loss of consciousness.  1.  Acute renal failure/chronic kidney disease stage III baseline creatinine 1.5 08/27/2018 with history of renal donation.  Patient appears to have severe acute renal failure now which is most likely due to hypotension.  Patient had seizure as well. We plan to maintain the patient on CRRT at this time.  Urine output was only 140 cc over the preceding 24 hours.  Continue patient on a 4K bath.  Monitor serum electrolytes.  2.  Metabolic acidosis.  Patient appears to have lactic acidosis.  C pH up to 7.3 today indicating improvement.  Continue CRRT.  3.  Acute respiratory failure.  Patient remains on the ventilator.  Continue ventilatory support and weaning protocol.   LOS: 2 Gershon Shorten  6/14/202012:34 PM

## 2019-04-03 NOTE — Progress Notes (Signed)
CRITICAL CARE NOTE  CC  follow up respiratory failure  SUBJECTIVE Patient remains critically ill Prognosis is guarded Started CRRT, clotted off vasc cath Severe hypoxia On vasopressors +multiorgan failure   Vent Mode: PRVC FiO2 (%):  [30 %-60 %] 30 % Set Rate:  [18 bmp-25 bmp] 25 bmp Vt Set:  [500 mL] 500 mL PEEP:  [8 cmH20] 8 cmH20 Plateau Pressure:  [18 cmH20-23 cmH20] 18 cmH20    BP (!) 97/49   Pulse 83   Temp 99.3 F (37.4 C)   Resp (!) 25   Ht _0  (1.778 m)   Wt (!) 137 kg   SpO2 99%   BMI 43.34 kg/m    I/O last 3 completed shifts: In: 4815.5 [I.V.:3877.7; NG/GT:248; IV Piggyback:689.8] Out: 290 [Urine:290] No intake/output data recorded.  SpO2: 99 % FiO2 (%): 30 %  CBC    Component Value Date/Time   WBC 8.5 04/03/2019 0524   RBC 3.01 (L) 04/03/2019 0524   HGB 9.5 (L) 04/03/2019 0524   HCT 29.8 (L) 04/03/2019 0524   PLT 163 04/03/2019 0524   MCV 99.0 04/03/2019 0524   MCH 31.6 04/03/2019 0524   MCHC 31.9 04/03/2019 0524   RDW 13.9 04/03/2019 0524   LYMPHSABS 8.0 (H) 04/01/2019 0530   MONOABS 1.1 (H) 04/01/2019 0530   EOSABS 1.4 (H) 04/01/2019 0530   BASOSABS 0.3 (H) 04/01/2019 0530   BMP Latest Ref Rng & Units 04/03/2019 04/03/2019 04/02/2019  Glucose 70 - 99 mg/dL 372(H) 364(H) 320(H)  BUN 6 - 20 mg/dL 30(H) 26(H) 24(H)  Creatinine 0.61 - 1.24 mg/dL 5.73(H) 5.37(H) 4.48(H)  Sodium 135 - 145 mmol/L 133(L) 135 136  Potassium 3.5 - 5.1 mmol/L 4.1 4.1 4.4  Chloride 98 - 111 mmol/L 100 101 102  CO2 22 - 32 mmol/L _1 Calcium 8.9 - 10.3 mg/dL 8.0(L) 8.0(L) 8.0(L)     SIGNIFICANT EVENTS 6/12 admitted for seizures, DKA, aspiration p[place] RT IJ CVL PLACED IN ER 6/13 severe hypoxia, acidosis LEFT IJ VASC PLACED for CRRT 6/14 remains critically ill, on vent, vasopressors REVIEW OF SYSTEMS  PATIENT IS UNABLE TO PROVIDE COMPLETE REVIEW OF SYSTEMS DUE TO SEVERE CRITICAL ILLNESS   PHYSICAL EXAMINATION:  GENERAL:critically ill  appearing, +resp distress HEAD: Normocephalic, atraumatic.  EYES: Pupils equal, round, reactive to light.  No scleral icterus.  MOUTH: Moist mucosal membrane. NECK: Supple. No thyromegaly. No nodules. No JVD.  PULMONARY: +rhonchi, +wheezing CARDIOVASCULAR: S1 and S2. Regular rate and rhythm. No murmurs, rubs, or gallops.  GASTROINTESTINAL: Soft, nontender, -distended. No masses. Positive bowel sounds. No hepatosplenomegaly.  MUSCULOSKELETAL: No swelling, clubbing, or edema.  NEUROLOGIC: obtunded, GCS<8 SKIN:intact,warm,dry  MEDICATIONS: I have reviewed all medications and confirmed regimen as documented   CULTURE RESULTS   Recent Results (from the past 240 hour(s))  Culture, blood (routine x 2)     Status: None (Preliminary result)   Collection Time: 04/01/19  5:30 AM   Specimen: BLOOD  Result Value Ref Range Status   Specimen Description BLOOD BLOOD LEFT HAND  Final   Special Requests   Final    BOTTLES DRAWN AEROBIC AND ANAEROBIC Blood Culture results may not be optimal due to an inadequate volume of blood received in culture bottles   Culture   Final    NO GROWTH 2 DAYS Performed at [facility], [address], [place], [place] [address]    Report Status PENDING  Incomplete  Urine culture     Status: None  Collection Time: 04/01/19  5:30 AM   Specimen: Urine, Random  Result Value Ref Range Status   Specimen Description   Final    URINE, RANDOM Performed at [facility], [address], [place], [place] [address]    Special Requests   Final    NONE Performed at [facility], [address], [place], [place] [address]    Culture   Final    NO GROWTH Performed at [facility], [place] [address], [place], [place] [address]    Report Status 04/02/2019 FINAL  Final  SARS Coronavirus 2 (CEPHEID - Performed in [place] hospital lab), Hosp Order     Status: None   Collection Time: 04/01/19  5:30 AM   Specimen: Nasopharyngeal Swab   Result Value Ref Range Status   SARS Coronavirus 2 NEGATIVE NEGATIVE Final    Comment: (NOTE) If result is NEGATIVE SARS-CoV-2 target nucleic acids are NOT DETECTED. The SARS-CoV-2 RNA is generally detectable in upper and lower  respiratory specimens during the acute phase of infection. The lowest  concentration of SARS-CoV-2 viral copies this assay can detect is 250  copies / mL. A negative result does not preclude SARS-CoV-2 infection  and should not be used as the sole basis for treatment or other  patient management decisions.  A negative result may occur with  improper specimen collection / handling, submission of specimen other  than nasopharyngeal swab, presence of viral mutation(s) within the  areas targeted by this assay, and inadequate number of viral copies  (<250 copies / mL). A negative result must be combined with clinical  observations, patient history, and epidemiological information. If result is POSITIVE SARS-CoV-2 target nucleic acids are DETECTED. The SARS-CoV-2 RNA is generally detectable in upper and lower  respiratory specimens dur ing the acute phase of infection.  Positive  results are indicative of active infection with SARS-CoV-2.  Clinical  correlation with patient history and other diagnostic information is  necessary to determine patient infection status.  Positive results do  not rule out bacterial infection or co-infection with other viruses. If result is PRESUMPTIVE POSTIVE SARS-CoV-2 nucleic acids MAY BE PRESENT.   A presumptive positive result was obtained on the submitted specimen  and confirmed on repeat testing.  While 2019 novel coronavirus  (SARS-CoV-2) nucleic acids may be present in the submitted sample  additional confirmatory testing may be necessary for epidemiological  and / or clinical management purposes  to differentiate between  SARS-CoV-2 and other Sarbecovirus currently known to infect humans.  If clinically indicated additional  testing with an alternate test  methodology [phone]) is advised. The SARS-CoV-2 RNA is generally  detectable in upper and lower respiratory sp ecimens during the acute  phase of infection. The expected result is Negative. Fact Sheet for Patients:  StrictlyIdeas.no Fact Sheet for Healthcare Providers: BankingDealers.co.za This test is not yet approved or cleared by the [place] FDA and has been authorized for detection and/or diagnosis of SARS-CoV-2 by FDA under an Emergency Use Authorization (EUA).  This EUA will remain in effect (meaning this test can be used) for the duration of the COVID-19 declaration under Section 564(b)(1) of the Act, 21 U.S.C. section 360bbb-3(b)(1), unless the authorization is terminated or revoked sooner. Performed at [facility], [address] [place], [place] [address]   Culture, blood (routine x 2)     Status: None (Preliminary result)   Collection Time: 04/01/19  6:49 AM   Specimen: BLOOD  Result Value  Ref Range Status   Specimen Description BLOOD BLOOD RIGHT HAND  Final   Special Requests   Final    BOTTLES DRAWN AEROBIC AND ANAEROBIC Blood Culture results may not be optimal due to an excessive volume of blood received in culture bottles   Culture   Final    NO GROWTH 2 DAYS Performed at [facility], [address], [place], [place] [address]    Report Status PENDING  Incomplete  MRSA PCR Screening     Status: None   Collection Time: 04/01/19 10:58 AM   Specimen: Nasal Mucosa; Nasopharyngeal  Result Value Ref Range Status   MRSA by PCR NEGATIVE NEGATIVE Final    Comment:        The GeneXpert MRSA Assay (FDA approved for NASAL specimens only), is one component of a comprehensive MRSA colonization surveillance program. It is not intended to diagnose MRSA infection nor to guide or monitor treatment for MRSA infections. Performed at [facility], [address] [place], [place] [address]   Culture, respiratory (non-expectorated)     Status: None (Preliminary result)   Collection Time: 04/01/19 12:50 PM   Specimen: Tracheal Aspirate; Respiratory  Result Value Ref Range Status   Specimen Description   Final    TRACHEAL ASPIRATE Performed at [facility], [address], [place], [place] [address]    Special Requests   Final    NONE Performed at [facility], [address] [place], [place] [address]    Gram Stain   Final    MODERATE WBC PRESENT, PREDOMINANTLY PMN FEW SQUAMOUS EPITHELIAL CELLS PRESENT ABUNDANT GRAM POSITIVE COCCI RARE GRAM POSITIVE RODS    Culture   Final    TOO YOUNG TO READ Performed at [facility], [place] [address], [place], [place] [address]    Report Status PENDING  Incomplete          IMAGING    Dg Chest Port 1 View  Result Date: 04/03/2019 CLINICAL DATA:  Acute respiratory failure. EXAM: PORTABLE CHEST 1 VIEW COMPARISON:  04/02/2019 FINDINGS: Endotracheal tube terminates 4.5 cm above the carina. Bilateral jugular catheters terminate over the SVC, unchanged. Enteric tube courses into the abdomen with tip not imaged. The cardiomediastinal silhouette is unchanged. Retrocardiac opacity in the left lung base has decreased. No overt pulmonary edema, large pleural effusion, or pneumothorax is identified. IMPRESSION: Improved left basilar aeration. Electronically Signed   By: Logan Bores M.D.   On: 04/03/2019 07:30   Dg Chest Port 1 View  Result Date: 04/02/2019 CLINICAL DATA:  Central line placement. Acute respiratory failure with hypoxia. Sepsis. On ventilator. EXAM: PORTABLE CHEST 1 VIEW COMPARISON:  04/01/2019 FINDINGS: A new left jugular dual-lumen central venous catheter is seen with tip overlying the distal SVC. Endotracheal tube, right jugular catheter and nasogastric tube remain in appropriate position. No pneumothorax visualized. Heart size is stable. Persistent opacity in the left  retrocardiac lung base results in silhouetting of the left hemidiaphragm, and is unchanged. Right lung remains clear. IMPRESSION: 1. New left jugular dual-lumen central venous catheter in appropriate position. No evidence of pneumothorax. 2. Stable left retrocardiac atelectasis versus infiltrate. Electronically Signed   By: Earle Gell M.D.   On: 04/02/2019 11:50       Indwelling Urinary Catheter continued, requirement due to   Reason to continue Indwelling Urinary Catheter strict Intake/Output monitoring for hemodynamic instability   Central Line/ continued, requirement due to  Reason to continue [employer] of  central venous pressure or other hemodynamic parameters and poor IV access   Ventilator continued, requirement due to severe respiratory failure   Ventilator Sedation RASS 0 to -2      ASSESSMENT AND PLAN SYNOPSIS  43 yo morbidly obese AAM with severe DKA with severe metabolic acidosis with severe resp failure and seizures +aspiration pneumonia and severe acute renal failure   Severe ACUTE Hypoxic and Hypercapnic Respiratory Failure -continue Full MV support -continue Bronchodilator Therapy -Wean Fio2 and PEEP as tolerated -will perform SAT/SBT when respiratory parameters are met  PROBABLE SYSTOLIC CARDIAC FAILURE- EF ECHO PENDING   ACUTE KIDNEY INJURY/Renal Failure -follow chem 7 -follow UO -continue Foley Catheter-assess need -Avoid nephrotoxic agents -Recheck creatinine  HD as needed/CRRT as tolerated   NEUROLOGY - intubated and sedated - minimal sedation to achieve a RASS goal: -1 Wake up assessment pending   SHOCK-SEPSIS/HYPOVOLUMIC/CARDIOGENIC -use vasopressors to keep MAP>65 -follow ABG and LA -follow up cultures -emperic ABX -consider stress dose steroids  CARDIAC ICU monitoring  ID -continue IV abx as prescibed -follow up cultures  GI GI PROPHYLAXIS as indicated  NUTRITIONAL STATUS DIET-->TF's as tolerated Constipation  protocol as indicated  ENDO - will use ICU hypoglycemic\Hyperglycemia protocol if indicated   ELECTROLYTES -follow labs as needed -replace as needed -pharmacy consultation and following   DVT/GI PRX ordered TRANSFUSIONS AS NEEDED MONITOR FSBS ASSESS the need for LABS as needed   Critical Care Time devoted to patient care services described in this note is 34 minutes.   Overall, patient is critically ill, prognosis is guarded.  Patient with Multiorgan failure and at high risk for cardiac arrest and death.    Corrin Parker, M.D.  [facility]  Medical Director [place] Director [facility] Cardio-Pulmonary Department

## 2019-04-03 NOTE — Progress Notes (Signed)
The 00:13 renal and mag. lab results would not flow over into the epic results tab at this time.  Lab results were printed off and sent to include in pt's chart.  Results are as follows:  Sodium 137 Potassium 4.2 Chloride 103 Carbon Dioxide 22 Glucose 312 Bun 24 Creatinine 4.38 Calcium 7.9 Phosphorus 3.5 Albumin 3.2 Anion Gap 12 Magnesium 2.3  A paper print out from lab is included in pt's chart.

## 2019-04-03 NOTE — Progress Notes (Signed)
[place] [facility] [place] NAME: Roy Oconnell    MR#:  [id number]  DATE OF BIRTH:  11-30-1975  SUBJECTIVE:  CHIEF COMPLAINT:   Chief Complaint  Patient presents with  . Seizures  . Loss of Consciousness   Patient is on ventilation and sedation.  Blood pressures better, on low-dose Levophed.  CRRT was discontinued due to clotted access. REVIEW OF SYSTEMS:  Review of Systems  Unable to perform ROS: Intubated    DRUG ALLERGIES:  Not on File VITALS:  Blood pressure (!) 187/108, pulse 95, temperature 99.9 F (37.7 C), resp. rate (!) 23, height 5\' 10"  (1.778 m), weight (!) 137 kg, SpO2 99 %. PHYSICAL EXAMINATION:  Physical Exam Constitutional:      Comments: Morbid obesity.  On ventilation.  HENT:     Head: Normocephalic.  Eyes:     General: No scleral icterus.    Conjunctiva/sclera: Conjunctivae normal.     Pupils: Pupils are equal, round, and reactive to light.  Neck:     Musculoskeletal: Neck supple.     Vascular: No JVD.     Trachea: No tracheal deviation.  Cardiovascular:     Rate and Rhythm: Normal rate and regular rhythm.     Heart sounds: Normal heart sounds. No murmur. No gallop.   Pulmonary:     Effort: Pulmonary effort is normal. No respiratory distress.     Breath sounds: Normal breath sounds. No wheezing or rales.  Abdominal:     General: Bowel sounds are normal. There is no distension.     Palpations: Abdomen is soft.     Tenderness: There is no abdominal tenderness. There is no rebound.  Musculoskeletal:        General: No tenderness.  Skin:    Findings: No erythema or rash.    LABORATORY PANEL:  Male CBC Recent Labs  Lab 04/03/19 0524  WBC 8.5  HGB 9.5*  HCT 29.8*  PLT 163   ------------------------------------------------------------------------------------------------------------------ Chemistries  Recent Labs  Lab 04/02/19 0730  04/03/19 1149  NA 138   < > 133*  K 4.8   < > 4.0  CL 104   <  > 99  CO2 22   < > 22  GLUCOSE 322*   < > 365*  BUN 28*   < > 32*  CREATININE 4.64*   < > 5.98*  CALCIUM 8.0*   < > 8.3*  MG  --    < > 2.5*  AST 32  --   --   ALT 29  --   --   ALKPHOS 68  --   --   BILITOT 0.9  --   --    < > = values in this interval not displayed.   RADIOLOGY:  Dg Chest Port 1 View  Result Date: 04/03/2019 CLINICAL DATA:  Acute respiratory failure. EXAM: PORTABLE CHEST 1 VIEW COMPARISON:  04/02/2019 FINDINGS: Endotracheal tube terminates 4.5 cm above the carina. Bilateral jugular catheters terminate over the SVC, unchanged. Enteric tube courses into the abdomen with tip not imaged. The cardiomediastinal silhouette is unchanged. Retrocardiac opacity in the left lung base has decreased. No overt pulmonary edema, large pleural effusion, or pneumothorax is identified. IMPRESSION: Improved left basilar aeration. Electronically Signed   By: Logan Bores M.D.   On: 04/03/2019 07:30   ASSESSMENT AND PLAN:   *DKA, hyperglycemia and diabetes. Improved with insulin drip and IV fluid support.  Anion gap close to normal. Continue  sliding scale and Lantus.  *New onset seizures.  Loaded with 1 dose of IV Keppra.     Per Dr. Irish Elders, Keppra 750 BID now for acute seizure prophylaxis for 3-5 days as likely provoked seizure.  Sepsis and aspiration pneumonia Continue IV Zosyn.  Follow-up blood culture.  Cytosis improved.  Septic shock.  Try to wean off Levophed.  *Acute kidney injury on CKD 3 secondary to DKA, hypotension and sepsis. Patient was treated with IV fluid, but renal function has been worsening.  Continue CRRT per Dr. Holley Raring.  Elevated troponin.  Possible due to demanding ischemia secondary to above.  *Acute metabolic encephalopathy due to seizures and DKA.  Presently intubated and sedated.  CT scan of the head negative.  Reassess once extubated and awake.  *Acute respiratory failure with hypoxia and hypercapnia due to above. Continue ventilation and sedation.  SAT/SBT trial.  I discussed with Dr. Mortimer Fries.  All the records are reviewed and case discussed with Care Management/Social Worker. Management plans discussed with the patient, family and they are in agreement.  CODE STATUS: Full Code  TOTAL TIME TAKING CARE OF THIS PATIENT: 28 minutes.   More than 50% of the time was spent in counseling/coordination of care: YES  POSSIBLE D/C IN ? DAYS, DEPENDING ON CLINICAL CONDITION.   [name] M.D on 04/03/2019 at 1:31 PM  Between 7am to 6pm - Pager - [phone]  After 6pm go to [email or web address] - [facility]

## 2019-04-03 NOTE — Progress Notes (Signed)
Initial Nutrition Assessment  RD working remotely.  DOCUMENTATION CODES:   Morbid obesity  INTERVENTION:  Initiate Vital High Protein at 20 mL/hr (480 mL goal daily volume) + Pro-Stat 60 mL 5 times daily per tube. Provides 1480 kcal, 192 grams of protein, 403 mL H2O daily.  Provide B-complex with C daily per tube.  Provide minimum free water flush of 30 mL Q4hrs to maintain tube patency.  NUTRITION DIAGNOSIS:   Inadequate oral intake related to inability to eat as evidenced by NPO status.  GOAL:      MONITOR:   Vent status, Labs, Weight trends, TF tolerance, I & O's  REASON FOR ASSESSMENT:   Ventilator, Consult Enteral/tube feeding initiation and management  ASSESSMENT:   43 year old male with PMHx of DM, HTN, HLD admitted with severe DKA with severe metabolic acidosis and severe respiratory failure requiring intubation on 6/12, also with seizures, aspiration PNA, and severe acute renal failure.   Patient intubated and sedated. On PRVC mode with FiO2 30% and PEEP 8 cmH2O. Abdomen taut per RN documentation. Last BM unknown/PTA. Skin is intact. Patient was started on CRRT yesterday with no UF.  Enteral Access: OGT placed 6/12; terminates in stomach per abdominal x-ray 6/12; 58 cm at corner of mouth  MAP: 63-86 mmHg  TF: pt was started on adult TF protocol yesterday; receiving Vital High Protein at 40 mL/hr + Pro-Stat 30 mL BID  Patient is currently intubated on ventilator support Ve: 12.6 L/min Temp (24hrs), Avg:98.8 F (37.1 C), Min:97.9 F (36.6 C), Max:99.7 F (37.6 C)  Propofol: 38.1 mL/hr (1006 kcal daily)  Medications reviewed and include: Novolog 0-15 unit sQ4hrs, Lantus 20 units BID, pantoprazole, fentanyl gtt, Keppra. Norepinephrine gtt at 2 mcg/min, Zosyn, propofol gtt.  Labs reviewed: CBG 263-333, Sodium 133, BUN 30, Creatinine 5.73, Magnesium 2.6.  I/O: 140 mL UOP yesterday  Weight trend: pt was 137 kg on 6/13 (302.03 lbs)  NUTRITION - FOCUSED  PHYSICAL EXAM:  Unable to complete at this time.  Diet Order:   Diet Order    None     EDUCATION NEEDS:   No education needs have been identified at this time  Skin:  Skin Assessment: Reviewed RN Assessment  Last BM:  Unknown/PTA  Height:   Ht Readings from Last 1 Encounters:  04/02/19 5\' 10"  (1.778 m)   Weight:   Wt Readings from Last 1 Encounters:  04/02/19 (!) 137 kg   Ideal Body Weight:  75.5 kg  BMI:  Body mass index is 43.34 kg/m.  Estimated Nutritional Needs:   Kcal:  9518-8416 (11-14 kcal/kg)  Protein:  189 grams (2.5 grams/kg IBW)  Fluid:  2-2.2 L/day  Willey Blade, MS, RD, LDN Office: [phone] Pager: [phone] After Hours/Weekend Pager: [phone]

## 2019-04-04 DIAGNOSIS — E0810 Diabetes mellitus due to underlying condition with ketoacidosis without coma: Secondary | ICD-10-CM

## 2019-04-04 DIAGNOSIS — G9341 Metabolic encephalopathy: Secondary | ICD-10-CM

## 2019-04-04 LAB — GLUCOSE, CAPILLARY
Glucose-Capillary: 215 mg/dL — ABNORMAL HIGH (ref 70–99)
Glucose-Capillary: 245 mg/dL — ABNORMAL HIGH (ref 70–99)
Glucose-Capillary: 246 mg/dL — ABNORMAL HIGH (ref 70–99)
Glucose-Capillary: 255 mg/dL — ABNORMAL HIGH (ref 70–99)
Glucose-Capillary: 268 mg/dL — ABNORMAL HIGH (ref 70–99)
Glucose-Capillary: 317 mg/dL — ABNORMAL HIGH (ref 70–99)

## 2019-04-04 LAB — CULTURE, RESPIRATORY W GRAM STAIN

## 2019-04-04 LAB — RENAL FUNCTION PANEL
Albumin: 2.9 g/dL — ABNORMAL LOW (ref 3.5–5.0)
Albumin: 2.9 g/dL — ABNORMAL LOW (ref 3.5–5.0)
Albumin: 2.9 g/dL — ABNORMAL LOW (ref 3.5–5.0)
Anion gap: 11 (ref 5–15)
Anion gap: 12 (ref 5–15)
Anion gap: 9 (ref 5–15)
BUN: 32 mg/dL — ABNORMAL HIGH (ref 6–20)
BUN: 35 mg/dL — ABNORMAL HIGH (ref 6–20)
BUN: 36 mg/dL — ABNORMAL HIGH (ref 6–20)
CO2: 22 mmol/L (ref 22–32)
CO2: 23 mmol/L (ref 22–32)
CO2: 23 mmol/L (ref 22–32)
Calcium: 8.2 mg/dL — ABNORMAL LOW (ref 8.9–10.3)
Calcium: 8.2 mg/dL — ABNORMAL LOW (ref 8.9–10.3)
Calcium: 8.4 mg/dL — ABNORMAL LOW (ref 8.9–10.3)
Chloride: 102 mmol/L (ref 98–111)
Chloride: 102 mmol/L (ref 98–111)
Chloride: 102 mmol/L (ref 98–111)
Creatinine, Ser: 4.68 mg/dL — ABNORMAL HIGH (ref 0.61–1.24)
Creatinine, Ser: 4.98 mg/dL — ABNORMAL HIGH (ref 0.61–1.24)
Creatinine, Ser: 5.16 mg/dL — ABNORMAL HIGH (ref 0.61–1.24)
GFR calc Af Amer: 15 mL/min — ABNORMAL LOW (ref >60)
GFR calc Af Amer: 15 mL/min — ABNORMAL LOW (ref >60)
GFR calc Af Amer: 17 mL/min — ABNORMAL LOW (ref >60)
GFR calc non Af Amer: 13 mL/min — ABNORMAL LOW (ref >60)
GFR calc non Af Amer: 13 mL/min — ABNORMAL LOW (ref >60)
GFR calc non Af Amer: 14 mL/min — ABNORMAL LOW (ref >60)
Glucose, Bld: 206 mg/dL — ABNORMAL HIGH (ref 70–99)
Glucose, Bld: 269 mg/dL — ABNORMAL HIGH (ref 70–99)
Glucose, Bld: 280 mg/dL — ABNORMAL HIGH (ref 70–99)
Phosphorus: 3.2 mg/dL (ref 2.5–4.6)
Phosphorus: 3.5 mg/dL (ref 2.5–4.6)
Phosphorus: 3.7 mg/dL (ref 2.5–4.6)
Potassium: 3.2 mmol/L — ABNORMAL LOW (ref 3.5–5.1)
Potassium: 3.8 mmol/L (ref 3.5–5.1)
Potassium: 3.9 mmol/L (ref 3.5–5.1)
Sodium: 134 mmol/L — ABNORMAL LOW (ref 135–145)
Sodium: 136 mmol/L (ref 135–145)
Sodium: 136 mmol/L (ref 135–145)

## 2019-04-04 LAB — CBC
HCT: 29.7 % — ABNORMAL LOW (ref 39.0–52.0)
Hemoglobin: 9.5 g/dL — ABNORMAL LOW (ref 13.0–17.0)
MCH: 30.3 pg (ref 26.0–34.0)
MCHC: 32 g/dL (ref 30.0–36.0)
MCV: 94.6 fL (ref 80.0–100.0)
Platelets: 145 10*3/uL — ABNORMAL LOW (ref 150–400)
RBC: 3.14 MIL/uL — ABNORMAL LOW (ref 4.22–5.81)
RDW: 13.7 % (ref 11.5–15.5)
WBC: 8.9 10*3/uL (ref 4.0–10.5)
nRBC: 0.2 % (ref 0.0–0.2)

## 2019-04-04 LAB — MAGNESIUM
Magnesium: 2 mg/dL (ref 1.7–2.4)
Magnesium: 2.1 mg/dL (ref 1.7–2.4)
Magnesium: 2.1 mg/dL (ref 1.7–2.4)
Magnesium: 2.1 mg/dL (ref 1.7–2.4)
Magnesium: 2.1 mg/dL (ref 1.7–2.4)

## 2019-04-04 LAB — C DIFFICILE QUICK SCREEN W PCR REFLEX
C Diff antigen: NEGATIVE
C Diff interpretation: NOT DETECTED
C Diff toxin: NEGATIVE

## 2019-04-04 LAB — VANCOMYCIN, RANDOM: Vancomycin Rm: 10

## 2019-04-04 LAB — PREALBUMIN: Prealbumin: 14.1 mg/dL — ABNORMAL LOW (ref 18–38)

## 2019-04-04 MED ORDER — INSULIN GLARGINE 100 UNIT/ML ~~LOC~~ SOLN
25.0000 [IU] | Freq: Two times a day (BID) | SUBCUTANEOUS | Status: DC
  Administered 2019-04-04 – 2019-04-07 (×5): 25 [IU] via SUBCUTANEOUS
  Filled 2019-04-04 (×7): qty 0.25

## 2019-04-04 MED ORDER — INSULIN ASPART 100 UNIT/ML ~~LOC~~ SOLN
3.0000 [IU] | SUBCUTANEOUS | Status: DC
  Administered 2019-04-04 – 2019-04-06 (×12): 3 [IU] via SUBCUTANEOUS
  Filled 2019-04-04 (×12): qty 1

## 2019-04-04 MED ORDER — POTASSIUM CHLORIDE 10 MEQ/50ML IV SOLN
10.0000 meq | INTRAVENOUS | Status: DC
  Administered 2019-04-04 (×3): 10 meq via INTRAVENOUS
  Filled 2019-04-04 (×3): qty 50

## 2019-04-04 MED ORDER — VANCOMYCIN HCL IN DEXTROSE 1-5 GM/200ML-% IV SOLN
1000.0000 mg | Freq: Once | INTRAVENOUS | Status: AC
  Administered 2019-04-04: 1000 mg via INTRAVENOUS
  Filled 2019-04-04: qty 200

## 2019-04-04 MED ORDER — VANCOMYCIN HCL 10 G IV SOLR
2250.0000 mg | Freq: Once | INTRAVENOUS | Status: DC
  Filled 2019-04-04: qty 2250

## 2019-04-04 MED ORDER — INSULIN GLARGINE 100 UNIT/ML ~~LOC~~ SOLN: 20.0000 [IU] | Freq: Two times a day (BID) | SUBCUTANEOUS | Status: DC

## 2019-04-04 MED ORDER — DEXMEDETOMIDINE HCL IN NACL 400 MCG/100ML IV SOLN
0.4000 ug/kg/h | INTRAVENOUS | Status: DC
  Administered 2019-04-04 – 2019-04-05 (×7): 1.2 ug/kg/h via INTRAVENOUS
  Administered 2019-04-05: 10:00:00 1 ug/kg/h via INTRAVENOUS
  Administered 2019-04-05: 1.2 ug/kg/h via INTRAVENOUS
  Filled 2019-04-04 (×8): qty 100

## 2019-04-04 MED ORDER — VANCOMYCIN VARIABLE DOSE PER UNSTABLE RENAL FUNCTION (PHARMACIST DOSING): Status: DC

## 2019-04-04 MED ORDER — LEVETIRACETAM IN NACL 1000 MG/100ML IV SOLN
1000.0000 mg | Freq: Every day | INTRAVENOUS | Status: DC
  Administered 2019-04-04 – 2019-04-07 (×4): 1000 mg via INTRAVENOUS
  Filled 2019-04-04 (×6): qty 100

## 2019-04-04 MED ORDER — PANTOPRAZOLE SODIUM 40 MG PO PACK
40.0000 mg | PACK | Freq: Every day | ORAL | Status: DC
  Administered 2019-04-04 – 2019-04-08 (×4): 40 mg
  Filled 2019-04-04 (×5): qty 20

## 2019-04-04 NOTE — Progress Notes (Signed)
Inpatient Diabetes Program Recommendations  AACE/ADA: New Consensus Statement on Inpatient Glycemic Control (2015)  Target Ranges:  Prepandial:   less than 140 mg/dL      Peak postprandial:   less than 180 mg/dL (1-2 hours)      Critically ill patients:  140 - 180 mg/dL   Results for Roy Oconnell, Roy Oconnell (MRN [id number]) as of 04/04/2019 07:23  Ref. Range 04/02/2019 23:25 04/03/2019 05:12 04/03/2019 07:18 04/03/2019 12:16 04/03/2019 16:20 04/03/2019 20:49  Glucose-Capillary Latest Ref Range: 70 - 99 mg/dL 286 (H)  8 units NOVOLOG 263 (H)  8 units NOVOLOG 333 (H)  11 units NOVOLOG +  20 units LANTUS given at 9:47am 368 (H)  15 units NOVOLOG 279 (H)  8 units NOVOLOG 260 (H)  8 units NOVOLOG +  20 units LANTUS given at 9:13pm    Results for Roy Oconnell, Roy Oconnell (MRN [id number]) as of 04/04/2019 07:23  Ref. Range 04/04/2019 00:00 04/04/2019 04:29  Glucose-Capillary Latest Ref Range: 70 - 99 mg/dL 268 (H)  8 units NOVOLOG 215 (H)  5 units NOVOLOG   Results for Roy Oconnell, Roy Oconnell (MRN [id number]) as of 04/04/2019 07:23  Ref. Range 04/01/2019 12:04  Hemoglobin A1C Latest Ref Range: 4.8 - 5.6 % 14.8 (H)  (378 mg/dl)    Admit New Onset Seizures/ DKA/ Acute Kidney Injury/ Possible Sepsis (Glucose 739, CO2 9, AG 30)  History: DM2   Home DM Meds: Glipizide 10 mg BID   Current Orders: Lantus 20 units BID      Novolog Moderate Correction Scale/ SSI (0-15 units) Q4 hours     Remains Intubated.  Per notes, CRRT clotted.  Getting Tube Feedings at 20cc/hr.    MD- CBGs remain >200 mg/dl since yesterday (06/14).  Please consider the following in-hospital insulin adjustments:   1. Increase Lantus to 25 units BID  2. Start Novolog Tube Feed Coverage: Novolog 3 units Q4 hours  (HOLD if tube feeds HELD for any reason)    ......OR.....   If adjustments do not help CBGs improve, may consider starting the ICU Glycemic Control Protocol (would start with Phase 2 IV Insulin  Drip)     --Will follow patient during hospitalization--  Wyn Quaker RN, MSN, CDE Diabetes Coordinator Inpatient Glycemic Control Team Team Pager: [phone] (8a-5p)

## 2019-04-04 NOTE — Progress Notes (Addendum)
Follow up - Critical Care Medicine Note  Patient Details:    Roy Oconnell is an 43 y.o. male admitted with acute encephalopathy, questionable to seizure-like activity, DKA requiring insulin gtt and acute hypoxic hypercapnic respiratory failure likely secondary to aspiration pneumonia requiring  Intubation and mechanical ventilation.   Lines, Airways, Drains: Airway 7.5 mm (Active)  Secured at (cm) 24 cm 04/04/19 1607  Measured From Lips 04/04/19 1607  Secured Location Left 04/04/19 1607  Secured By [employer] 04/04/19 1607  Tube Holder Repositioned Yes 04/04/19 1607  Cuff Pressure (cm H2O) 15 cm H2O 04/04/19 1120  Site Condition Dry 04/04/19 1607     CVC Triple Lumen 04/01/19 Right Internal jugular 20 cm (Active)  Indication for Insertion or Continuance of Line Vasoactive infusions;Prolonged intravenous therapies 04/04/19 0800  Site Assessment Clean;Dry;Intact 04/04/19 0800  Proximal Lumen Status Infusing 04/04/19 0800  Medial Lumen Status Infusing 04/04/19 0800  Distal Lumen Status In-line blood sampling system in place;Blood return noted 04/04/19 0800  Dressing Type Transparent;Occlusive 04/04/19 0800  Dressing Status Clean;Dry;Intact;Antimicrobial disc in place 04/04/19 0800  Line Care Connections checked and tightened 04/04/19 0800  Dressing Intervention New dressing;Dressing changed;Antimicrobial disc changed 04/04/19 0000  Dressing Change Due 04/11/19 04/04/19 0800     NG/OG Tube Orogastric Center mouth 58 cm (Active)  Cm Marking at Nare/Corner of Mouth (if applicable) 58 cm 42/35/36 1600  Site Assessment Clean;Intact;Dry 04/04/19 1600  Ongoing Placement Verification No change in cm markings or external length of tube from initial placement;No change in respiratory status;No acute changes, not attributed to clinical condition 04/04/19 1600  Status Infusing tube feed 04/04/19 1600  Amount of suction 80 mmHg 04/02/19 1200  Drainage Appearance None 04/03/19 0400   Intake (mL) 90 mL 04/03/19 2200     Rectal Tube/Pouch (Active)     Urethral Catheter Zach ED tech Non-latex 14 Fr. (Active)  Indication for Insertion or Continuance of Catheter Therapy based on hourly urine output monitoring and documentation for critical condition (NOT STRICT I&O) 04/04/19 0800  Site Assessment Clean;Intact;Dry 04/04/19 0800  Date Prophylactic Dressing Applied (if applicable) 14/43/15 40/08/67 2000  Catheter Maintenance Bag below level of bladder;Catheter secured;Drainage bag/tubing not touching floor;Insertion date on drainage bag;No dependent loops;Seal intact 04/04/19 1600  Collection Container Standard drainage bag 04/04/19 0800  Securement Method Securing device (Describe) 04/04/19 0800  Urinary Catheter Interventions Unclamped 04/03/19 0400  Output (mL) 0 mL 04/04/19 0400    Anti-infectives:  Anti-infectives (From admission, onward)   Start     Dose/Rate Route Frequency Ordered Stop   04/04/19 1500  vancomycin (VANCOCIN) IVPB 1000 mg/200 mL premix     1,000 mg 200 mL/hr over 60 Minutes Intravenous  Once 04/04/19 1454 04/04/19 1628   04/04/19 1455  vancomycin variable dose per unstable renal function (pharmacist dosing)      Does not apply See admin instructions 04/04/19 1456     04/04/19 1130  vancomycin (VANCOCIN) 2,250 mg in sodium chloride 0.9 % 500 mL IVPB  Status:  Discontinued     2,250 mg 250 mL/hr over 120 Minutes Intravenous  Once 04/04/19 1123 04/04/19 1129   04/02/19 1200  vancomycin (VANCOCIN) 1,250 mg in sodium chloride 0.9 % 250 mL IVPB  Status:  Discontinued     1,250 mg 166.7 mL/hr over 90 Minutes Intravenous Every 24 hours 04/01/19 1358 04/01/19 1438   04/01/19 1600  piperacillin-tazobactam (ZOSYN) IVPB 3.375 g  Status:  Discontinued     3.375 g 12.5 mL/hr over 240 Minutes  Intravenous Every 8 hours 04/01/19 1449 04/04/19 1102   04/01/19 1400  ceFEPIme (MAXIPIME) 2 g in sodium chloride 0.9 % 100 mL IVPB  Status:  Discontinued     2 g 200  mL/hr over 30 Minutes Intravenous Every 12 hours 04/01/19 0941 04/01/19 1438   04/01/19 0945  vancomycin (VANCOCIN) 1,500 mg in sodium chloride 0.9 % 500 mL IVPB     1,500 mg 250 mL/hr over 120 Minutes Intravenous  Once 04/01/19 0939 04/01/19 1500   04/01/19 0645  clindamycin (CLEOCIN) IVPB 600 mg     600 mg 100 mL/hr over 30 Minutes Intravenous  Once 04/01/19 0641 04/01/19 0748   04/01/19 0615  vancomycin (VANCOCIN) IVPB 1000 mg/200 mL premix     1,000 mg 200 mL/hr over 60 Minutes Intravenous  Once 04/01/19 0612 04/01/19 0948   04/01/19 0615  piperacillin-tazobactam (ZOSYN) IVPB 3.375 g     3.375 g 100 mL/hr over 30 Minutes Intravenous  Once 04/01/19 7824 04/01/19 2353      Microbiology: Results for orders placed or performed during the hospital encounter of 04/01/19  Culture, blood (routine x 2)     Status: None (Preliminary result)   Collection Time: 04/01/19  5:30 AM   Specimen: BLOOD  Result Value Ref Range Status   Specimen Description BLOOD BLOOD LEFT HAND  Final   Special Requests   Final    BOTTLES DRAWN AEROBIC AND ANAEROBIC Blood Culture results may not be optimal due to an inadequate volume of blood received in culture bottles   Culture   Final    NO GROWTH 2 DAYS Performed at [facility], [address] [place], [place] [address]    Report Status PENDING  Incomplete  Urine culture     Status: None   Collection Time: 04/01/19  5:30 AM   Specimen: Urine, Random  Result Value Ref Range Status   Specimen Description   Final    URINE, RANDOM Performed at [facility], [address], [place], [place] [address]    Special Requests   Final    NONE Performed at [facility], [address], [place], [place] [address]    Culture   Final    NO GROWTH Performed at [facility], [place] [address] [place], [place] [address]    Report Status 04/02/2019 FINAL  Final  SARS Coronavirus 2 (CEPHEID - Performed in [place] hospital lab),  Hosp Order     Status: None   Collection Time: 04/01/19  5:30 AM   Specimen: Nasopharyngeal Swab  Result Value Ref Range Status   SARS Coronavirus 2 NEGATIVE NEGATIVE Final    Comment: (NOTE) If result is NEGATIVE SARS-CoV-2 target nucleic acids are NOT DETECTED. The SARS-CoV-2 RNA is generally detectable in upper and lower  respiratory specimens during the acute phase of infection. The lowest  concentration of SARS-CoV-2 viral copies this assay can detect is 250  copies / mL. A negative result does not preclude SARS-CoV-2 infection  and should not be used as the sole basis for treatment or other  patient management decisions.  A negative result may occur with  improper specimen collection / handling, submission of specimen other  than nasopharyngeal swab, presence of viral mutation(s) within the  areas targeted by this assay, and inadequate number of viral copies  (<250 copies / mL). A negative result must be combined with clinical  observations, patient history, and epidemiological information. If result is POSITIVE SARS-CoV-2 target nucleic acids  are DETECTED. The SARS-CoV-2 RNA is generally detectable in upper and lower  respiratory specimens dur ing the acute phase of infection.  Positive  results are indicative of active infection with SARS-CoV-2.  Clinical  correlation with patient history and other diagnostic information is  necessary to determine patient infection status.  Positive results do  not rule out bacterial infection or co-infection with other viruses. If result is PRESUMPTIVE POSTIVE SARS-CoV-2 nucleic acids MAY BE PRESENT.   A presumptive positive result was obtained on the submitted specimen  and confirmed on repeat testing.  While 2019 novel coronavirus  (SARS-CoV-2) nucleic acids may be present in the submitted sample  additional confirmatory testing may be necessary for epidemiological  and / or clinical management purposes  to differentiate between   SARS-CoV-2 and other Sarbecovirus currently known to infect humans.  If clinically indicated additional testing with an alternate test  methodology [phone]) is advised. The SARS-CoV-2 RNA is generally  detectable in upper and lower respiratory sp ecimens during the acute  phase of infection. The expected result is Negative. Fact Sheet for Patients:  StrictlyIdeas.no Fact Sheet for Healthcare Providers: BankingDealers.co.za This test is not yet approved or cleared by the [place] FDA and has been authorized for detection and/or diagnosis of SARS-CoV-2 by FDA under an Emergency Use Authorization (EUA).  This EUA will remain in effect (meaning this test can be used) for the duration of the COVID-19 declaration under Section 564(b)(1) of the Act, 21 U.S.C. section 360bbb-3(b)(1), unless the authorization is terminated or revoked sooner. Performed at [facility], [address] [place], [place] [address]   Culture, blood (routine x 2)     Status: None (Preliminary result)   Collection Time: 04/01/19  6:49 AM   Specimen: BLOOD  Result Value Ref Range Status   Specimen Description BLOOD BLOOD RIGHT HAND  Final   Special Requests   Final    BOTTLES DRAWN AEROBIC AND ANAEROBIC Blood Culture results may not be optimal due to an excessive volume of blood received in culture bottles   Culture   Final    NO GROWTH 2 DAYS Performed at [facility], [address], [place], [place] [address]    Report Status PENDING  Incomplete  MRSA PCR Screening     Status: None   Collection Time: 04/01/19 10:58 AM   Specimen: Nasal Mucosa; Nasopharyngeal  Result Value Ref Range Status   MRSA by PCR NEGATIVE NEGATIVE Final    Comment:        The GeneXpert MRSA Assay (FDA approved for NASAL specimens only), is one component of a comprehensive MRSA colonization surveillance program. It is not intended to diagnose MRSA infection  nor to guide or monitor treatment for MRSA infections. Performed at [facility], [address] [place], [place] [address]   Culture, respiratory (non-expectorated)     Status: None   Collection Time: 04/01/19 12:50 PM   Specimen: Tracheal Aspirate; Respiratory  Result Value Ref Range Status   Specimen Description   Final    TRACHEAL ASPIRATE Performed at [facility], [address] [place], [place] [address]    Special Requests   Final    NONE Performed at [facility], [address] [place], [place] [address]    Gram Stain   Final    MODERATE WBC PRESENT, PREDOMINANTLY PMN FEW SQUAMOUS EPITHELIAL CELLS PRESENT ABUNDANT GRAM POSITIVE COCCI RARE GRAM POSITIVE RODS Performed at [facility], [place]  [address] [place], [place] [address]    Culture FEW METHICILLIN RESISTANT STAPHYLOCOCCUS AUREUS  Final   Report Status 04/04/2019 FINAL  Final   Organism ID, Bacteria METHICILLIN RESISTANT STAPHYLOCOCCUS AUREUS  Final      Susceptibility   Methicillin resistant staphylococcus aureus - MIC*    CIPROFLOXACIN <=0.5 SENSITIVE Sensitive     ERYTHROMYCIN <=0.25 SENSITIVE Sensitive     GENTAMICIN <=0.5 SENSITIVE Sensitive     OXACILLIN >=4 RESISTANT Resistant     TETRACYCLINE <=1 SENSITIVE Sensitive     VANCOMYCIN <=0.5 SENSITIVE Sensitive     TRIMETH/SULFA <=10 SENSITIVE Sensitive     CLINDAMYCIN <=0.25 SENSITIVE Sensitive     RIFAMPIN <=0.5 SENSITIVE Sensitive     Inducible Clindamycin NEGATIVE Sensitive     * FEW METHICILLIN RESISTANT STAPHYLOCOCCUS AUREUS    Best Practice/Protocols:  VTE Prophylaxis: Heparin (CRRT) GI Prophylaxis: Proton Pump Inhibitor Continous Sedation Hyperglycemia (ICU)  Events: 6/12 admitted for seizures, DKA, aspiration pnwumonia RT IJ CVL PLACED IN ER 6/13 severe hypoxia, acidosis LEFT IJ VASC PLACED for CRRT 6/14 remains critically ill, on vent, vasopressors  Studies: Dg Chest 1 View  Result Date:  04/01/2019 CLINICAL DATA:  New right-sided central line placement. EXAM: CHEST  1 VIEW COMPARISON:  None. FINDINGS: The heart size is exaggerated by low lung volumes. Endotracheal tube terminates 4 cm above the carina. Right IJ line is new. There is no pneumothorax. Lung volumes are low. Left greater than right basilar airspace opacities are present. Moderate pulmonary vascular congestion is again noted. IMPRESSION: 1. New right IJ line without radiographic evidence for complication. 2. Low lung volumes and moderate pulmonary vascular congestion. 3. Left greater than right basilar opacities again noted. Electronically Signed   By: San Morelle M.D.   On: 04/01/2019 08:53   Dg Chest 1 View  Result Date: 04/01/2019 CLINICAL DATA:  Unresponsive. EXAM: CHEST  1 VIEW COMPARISON:  None. FINDINGS: The patient is intubated. Endotracheal tube terminates 3.4 cm above the carina. Side port of the NG tube is beyond the GE junction. The heart is enlarged. This is exaggerated by low lung volumes. Perihilar prominence is noted bilaterally. Minimal basilar airspace opacities likely reflect atelectasis. IMPRESSION: 1. Low lung volumes. 2. Cardiomegaly without failure. 3. Moderate perihilar prominence. This may be due to pulmonary vascular congestion and low lung volumes. Adenopathy or mass lesion is not excluded. 4. Bibasilar airspace opacities likely reflect atelectasis. Electronically Signed   By: San Morelle M.D.   On: 04/01/2019 06:05   Ct Head Wo Contrast  Result Date: 04/01/2019 CLINICAL DATA:  Unresponsive.  Possible seizures. EXAM: CT HEAD WITHOUT CONTRAST TECHNIQUE: Contiguous axial images were obtained from the base of the skull through the vertex without intravenous contrast. COMPARISON:  None. FINDINGS: Brain: No acute infarct, hemorrhage, or mass lesion is present. No significant white matter lesions are present. The ventricles are of normal size. No significant extraaxial fluid collection is  present. The brainstem and cerebellum are within normal limits. Vascular: No hyperdense vessel or unexpected calcification. Skull: Calvarium is intact. No focal lytic or blastic lesions are present. Remote right-sided nasal bone fracture is noted. Sinuses/Orbits: The paranasal sinuses and mastoid air cells are clear. The globes and orbits are within normal limits. IMPRESSION: 1. Normal CT appearance the brain. Electronically Signed   By: San Morelle M.D.   On: 04/01/2019 06:18   US Renal  Result Date: 04/03/2019 CLINICAL DATA:  Acute renal failure, history diabetes mellitus, hypertension EXAM: RENAL / URINARY TRACT ULTRASOUND COMPLETE  COMPARISON:  None FINDINGS: Right Kidney: Renal measurements: 13.6 x 7.2 x 6.4 cm = volume: 328 mL. Normal cortical thickness and echogenicity. No mass, hydronephrosis or shadowing calcification. Left Kidney: Not identified in the LEFT renal fossa. No LEFT renal tissue identified in the pelvis. Bladder: Decompressed, contains minimal urine, poorly assessed. Incidentally noted echogenic hepatic parenchyma, likely fatty infiltration though this can be seen with cirrhosis and certain infiltrative disorders. Visualized hepatic margins appear smooth. IMPRESSION: Normal appearing RIGHT kidney. Probable fatty infiltration of liver as above. Nonvisualization of LEFT kidney; this could be due to congenital or surgical absence, isoechogenicity with retroperitoneal fat making it inapparent, or ectopic in position. Recommend correlation with patient history. If localization of the LEFT kidney is desired, consider CT. Electronically Signed   By: Lavonia Dana M.D.   On: 04/03/2019 16:17   Dg Chest Port 1 View  Result Date: 04/03/2019 CLINICAL DATA:  Acute respiratory failure. EXAM: PORTABLE CHEST 1 VIEW COMPARISON:  04/02/2019 FINDINGS: Endotracheal tube terminates 4.5 cm above the carina. Bilateral jugular catheters terminate over the SVC, unchanged. Enteric tube courses into the  abdomen with tip not imaged. The cardiomediastinal silhouette is unchanged. Retrocardiac opacity in the left lung base has decreased. No overt pulmonary edema, large pleural effusion, or pneumothorax is identified. IMPRESSION: Improved left basilar aeration. Electronically Signed   By: Logan Bores M.D.   On: 04/03/2019 07:30   Dg Chest Port 1 View  Result Date: 04/02/2019 CLINICAL DATA:  Central line placement. Acute respiratory failure with hypoxia. Sepsis. On ventilator. EXAM: PORTABLE CHEST 1 VIEW COMPARISON:  04/01/2019 FINDINGS: A new left jugular dual-lumen central venous catheter is seen with tip overlying the distal SVC. Endotracheal tube, right jugular catheter and nasogastric tube remain in appropriate position. No pneumothorax visualized. Heart size is stable. Persistent opacity in the left retrocardiac lung base results in silhouetting of the left hemidiaphragm, and is unchanged. Right lung remains clear. IMPRESSION: 1. New left jugular dual-lumen central venous catheter in appropriate position. No evidence of pneumothorax. 2. Stable left retrocardiac atelectasis versus infiltrate. Electronically Signed   By: Earle Gell M.D.   On: 04/02/2019 11:50   Dg Abd Portable 1v  Result Date: 04/01/2019 CLINICAL DATA:  Evaluate NG tube EXAM: PORTABLE ABDOMEN - 1 VIEW COMPARISON:  None. FINDINGS: The NG tube terminates in the stomach. No other acute abnormalities. IMPRESSION: The NG tube terminates in the stomach. Electronically Signed   By: Dorise Bullion III M.D   On: 04/01/2019 17:15    Consults: Treatment Team:  Pccm, Ander Gaster, MD Flora Lipps, MD Leotis Pain, MD   Subjective:    Overnight Issues has required intermittent use of paralytic due to agitation.  Objective:  Vital signs for last 24 hours: Temp:  [97.2 F (36.2 C)-99.1 F (37.3 C)] 99.1 F (37.3 C) (06/15 1800) Pulse Rate:  [68-104] 72 (06/15 1800) Resp:  [4-27] 25 (06/15 1800) BP: (107-190)/(61-105) 111/76  (06/15 1800) SpO2:  [95 %-100 %] 100 % (06/15 1800) FiO2 (%):  [30 %] 30 % (06/15 1607) Weight:  [809 kg] 137 kg (06/15 0500)  Hemodynamic parameters for last 24 hours:    Intake/Output from previous day: 06/14 0701 - 06/15 0700 In: 1780.9 [I.V.:1150.7; NG/GT:351.3; IV Piggyback:278.9] Out: 3 [Urine:3]  Intake/Output this shift: Total I/O In: 1077.8 [I.V.:612.6; IV Piggyback:465.1] Out: -   Vent settings for last 24 hours: Vent Mode: PRVC FiO2 (%):  [30 %] 30 % Set Rate:  [25 bmp] 25 bmp Vt Set:  [500 mL]  500 mL PEEP:  [8 cmH20] 8 cmH20 Plateau Pressure:  [21 cmH20-22 cmH20] 21 cmH20  Physical Exam:  General:  Well developed, well nourished male, intubated, sedated, synchronous with the ventilator Neuro: agitated intermittently, moves all 4 spontaneously HEENT:  PERRL, no scleral icterus  Neck: Supple, no JVD, trachea midline Cardiovascular: RRR, no RM or G  Lungs:  Coarse breath sounds i throughout, synchronous with vent Abdomen: +BS x4,soft Musculoskeletal: normal bulk and tone, no edema  Skin: intact no rashes or lesions present   Assessment/Plan:  1.  Acute hypoxic respiratory failure: Likely due to aspiration pneumonia in the setting of metabolic derangements due to severe DKA.  Patient has corrected his metabolic derangements.  Currently limitation to extubation is persistent encephalopathy/delirium.  In addition the patient has volume overload due to acute renal failure.  Continue ventilator support.  Continue antibiotics for aspiration pneumonia (MRSA in sputum).   2.  Aspiration pneumonia: Continue vancomycin for MRSA.   3.  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  Discussed with Dr. Juleen China.  4.  Encephalopathy, acute metabolic: Start Precedex wean off of other sedatives.  Daily wake-up assessments  5.  Diabetic ketoacidosis: Ketoacidosis resolved.  Continues with hyperglycemia, hyperglycemia  protocol.  6.  Seizures: No recurrence.  Continue Keppra.  7.  Obesity: This issue adds complexity to his management.      LOS: 3 days   Additional comments: Discussed at multidisciplinary rounds  Critical Care Total Time*: 40 Minutes  C. Derrill Kay, MD [place] [facility] 04/04/2019  *Care during the described time interval was provided by me and/or other providers on the critical care team.  I have reviewed this patient's available data, including medical history, events of note, physical examination and test results as part of my evaluation.

## 2019-04-04 NOTE — Progress Notes (Signed)
Pharmacy Antibiotic Note  Roy Oconnell is a 43 y.o. male admitted on 04/01/2019 for evaluation of seizures and loss of consciousness. Pharmacy consulted for vancomycin dosing due to tracheal aspirate growing MRSA. Patient has past medical history significant for kidney donation, hypertension, hyperlipidema, CKD Stage III, and type 2 diabetes. Patient transitioned off CRRT this am and will require HD with initial session likely on 6/16.   Plan: Patient transitioned off Zosyn during am ICU rounds.   Patient received vancomycin 2500 mg on 6/12. Vancomycin random level is 10. Will order vancomycin 1g IV x 1. Anticipate giving vancomycin 1g IV x 1 after dialysis on 6/16.   Height: 5\' 10"  (177.8 cm) Weight: (!) 302 lb 0.5 oz (137 kg) IBW/kg (Calculated) : 73  Temp (24hrs), Avg:98.4 F (36.9 C), Min:97.2 F (36.2 C), Max:99.5 F (37.5 C)  Recent Labs  Lab 04/01/19 0530 04/01/19 0818  04/01/19 1206  04/02/19 0730  04/02/19 2029 04/03/19 0524  04/03/19 1624 04/03/19 2016 04/04/19 0002 04/04/19 0430 04/04/19 0750 04/04/19 1208  WBC 26.2*  --   --   --   --  9.0  --   --  8.5  --   --   --   --  8.9  --   --   CREATININE 2.30*  --    < >  --    < > 4.64*   < > 4.48* 5.37*   < > 5.57* 4.86* 5.16* 4.68* 4.98*  --   LATICACIDVEN >11.0* 10.5*  --  7.2*  --   --   --  1.4  --   --   --   --   --   --   --   --   VANCORANDOM  --   --   --   --   --   --   --   --   --   --   --   --   --   --   --  10   < > = values in this interval not displayed.    Estimated Creatinine Clearance: 27.2 mL/min (A) (by C-G formula based on SCr of 4.98 mg/dL (H)).    Not on File  Antimicrobials this admission: Clindamycin 6/12 x 1  Zosyn 6/12 >> 6/15  Vancomycin 6/12; 6/15 >>   Dose adjustments this admission: N/A   Microbiology results: 6/12 BCx: no growth x 2 days  6/12 UCx: negative  6/12 Sputum: MRSA 6/12 MRSA PCR: negative  6/12 COVID: negative   Thank you for allowing pharmacy to be a  part of this patient's care.  , L 04/04/2019 2:57 PM

## 2019-04-04 NOTE — Progress Notes (Signed)
[place] [place] NAME: [place] Oconnell    MR#:  [id number]  DATE OF BIRTH:  1975-10-25  SUBJECTIVE:  CHIEF COMPLAINT:   Chief Complaint  Patient presents with  . Seizures  . Loss of Consciousness   Patient is still on ventilation and sedation. off Levophed drip.  CRRT was discontinued due to clotted access.  REVIEW OF SYSTEMS:  Review of Systems  Unable to perform ROS: Intubated    DRUG ALLERGIES:  Not on File VITALS:  Blood pressure (!) 149/92, pulse 86, temperature 97.9 F (36.6 C), temperature source Bladder, resp. rate 18, height 5\' 10"  (1.778 m), weight (!) 137 kg, SpO2 100 %. PHYSICAL EXAMINATION:  Physical Exam Constitutional:      Comments: Morbid obesity.  On ventilation.  HENT:     Head: Normocephalic.  Eyes:     General: No scleral icterus.    Conjunctiva/sclera: Conjunctivae normal.     Pupils: Pupils are equal, round, and reactive to light.  Neck:     Musculoskeletal: Neck supple.     Vascular: No JVD.     Trachea: No tracheal deviation.  Cardiovascular:     Rate and Rhythm: Normal rate and regular rhythm.     Heart sounds: Normal heart sounds. No murmur. No gallop.   Pulmonary:     Effort: Pulmonary effort is normal. No respiratory distress.     Breath sounds: Normal breath sounds. No wheezing or rales.  Abdominal:     General: Bowel sounds are normal. There is no distension.     Palpations: Abdomen is soft.     Tenderness: There is no abdominal tenderness. There is no rebound.  Musculoskeletal:        General: No tenderness.  Skin:    Findings: No erythema or rash.    LABORATORY PANEL:  Male CBC Recent Labs  Lab 04/04/19 0430  WBC 8.9  HGB 9.5*  HCT 29.7*  PLT 145*   ------------------------------------------------------------------------------------------------------------------ Chemistries  Recent Labs  Lab 04/02/19 0730  04/04/19 0750 04/04/19 1208  NA 138   < > 136  --   K 4.8    < > 3.9  --   CL 104   < > 102  --   CO2 22   < > 22  --   GLUCOSE 322*   < > 280*  --   BUN 28*   < > 36*  --   CREATININE 4.64*   < > 4.98*  --   CALCIUM 8.0*   < > 8.4*  --   MG  --    < > 2.1 2.1  AST 32  --   --   --   ALT 29  --   --   --   ALKPHOS 68  --   --   --   BILITOT 0.9  --   --   --    < > = values in this interval not displayed.   RADIOLOGY:  US Renal  Result Date: 04/03/2019 CLINICAL DATA:  Acute renal failure, history diabetes mellitus, hypertension EXAM: RENAL / URINARY TRACT ULTRASOUND COMPLETE COMPARISON:  None FINDINGS: Right Kidney: Renal measurements: 13.6 x 7.2 x 6.4 cm = volume: 328 mL. Normal cortical thickness and echogenicity. No mass, hydronephrosis or shadowing calcification. Left Kidney: Not identified in the LEFT renal fossa. No LEFT renal tissue identified in the pelvis. Bladder: Decompressed, contains minimal urine, poorly assessed. Incidentally noted echogenic hepatic parenchyma,  likely fatty infiltration though this can be seen with cirrhosis and certain infiltrative disorders. Visualized hepatic margins appear smooth. IMPRESSION: Normal appearing RIGHT kidney. Probable fatty infiltration of liver as above. Nonvisualization of LEFT kidney; this could be due to congenital or surgical absence, isoechogenicity with retroperitoneal fat making it inapparent, or ectopic in position. Recommend correlation with patient history. If localization of the LEFT kidney is desired, consider CT. Electronically Signed   By: Lavonia Dana M.D.   On: 04/03/2019 16:17   ASSESSMENT AND PLAN:   *DKA, hyperglycemia and diabetes. Improved with insulin drip and IV fluid support.  Anion gap close to normal. Continue sliding scale and increased Lantus.  *New onset seizures.  Loaded with 1 dose of IV Keppra.     Per Dr. Irish Elders, Keppra 750 BID now for acute seizure prophylaxis for 3-5 days as likely provoked seizure.  Sepsis and aspiration pneumonia Continue IV Zosyn.   Follow-up blood culture.  Cytosis improved.  Septic shock.  Off Levophed.  *Acute kidney injury on CKD 3 secondary to DKA, hypotension and sepsis. Patient was treated with IV fluid, but renal function has been worsening.  He was on CRRT. If no improvement, will need intermittent hemodialysis per Dr. Juleen China.  Elevated troponin.  Possible due to demanding ischemia secondary to above.  *Acute metabolic encephalopathy due to seizures and DKA.  Presently intubated and sedated.  CT scan of the head negative.  Reassess once extubated and awake.  *Acute respiratory failure with hypoxia and hypercapnia due to above. Continue ventilation and sedation.  Patient failed SAT/SBT trial.  All the records are reviewed and case discussed with Care. Management/Social Worker. Management plans discussed with the patient, family and they are in agreement.  CODE STATUS: Full Code  TOTAL TIME TAKING CARE OF THIS PATIENT: 28 minutes.   More than 50% of the time was spent in counseling/coordination of care: YES  POSSIBLE D/C IN ? DAYS, DEPENDING ON CLINICAL CONDITION.   [name] M.D on 04/04/2019 at 2:13 PM  Between 7am to 6pm - Pager - [phone]  After 6pm go to [email or web address] - [facility]

## 2019-04-04 NOTE — Progress Notes (Signed)
CRRT machine stopped at 0645 due to filter clotting and arterial air.  Access ports instilled with heparin.

## 2019-04-04 NOTE — Progress Notes (Signed)
Pt had labs due at 1800. I called the lab and asked them to send the tubes, as some were specialized tubes not on floor stock. Tubes have not arrived yet. I am going to re-time labs for 2000 and place a call back to the lab.

## 2019-04-04 NOTE — Progress Notes (Signed)
[facility]  ROUNDING NOTE   Subjective:  CRRT clotted off this morning.   Anuric   Off vasopressors.   Objective:  Vital signs in last 24 hours:  Temp:  [97.2 F (36.2 C)-100 F (37.8 C)] 97.9 F (36.6 C) (06/15 0800) Pulse Rate:  [68-104] 86 (06/15 0800) Resp:  [4-27] 18 (06/15 0800) BP: (107-199)/(65-110) 149/92 (06/15 0800) SpO2:  [92 %-100 %] 100 % (06/15 0800) FiO2 (%):  [30 %] 30 % (06/15 0801) Weight:  [372 kg] 137 kg (06/15 0500)  Weight change:  Filed Weights   04/01/19 0636 04/02/19 0500 04/04/19 0500  Weight: 127 kg (!) 137 kg (!) 137 kg    Intake/Output: I/O last 3 completed shifts: In: 2555.8 [I.V.:1754.2; NG/GT:351.3; IV Piggyback:450.3] Out: 53 [Urine:53]   Intake/Output this shift:  Total I/O In: 214.5 [I.V.:49.4; IV Piggyback:165.1] Out: -   Physical Exam: General: Critically ill  Head: ETT  Eyes: Anicteric  Neck: trachea midline  Lungs:   Bilateral rales and rhonchi, PRVC 30%  Heart: regular  Abdomen:  Soft, obese, +bowel sounds  Extremities: 1+ peripheral edema.  Neurologic: Intubated, sedated  Skin: No lesions  Access: L IJ temporary dialysis catheter    Basic Metabolic Panel: Recent Labs  Lab 04/03/19 1624 04/03/19 2016 04/04/19 0002 04/04/19 0430 04/04/19 0750  NA 135 131* 134* 136 136  K 4.3 4.0 3.8 3.2* 3.9  CL 100 98 102 102 102  CO2 23 21* _0 GLUCOSE 284* 264* 269* 206* 280*  BUN 33* 31* 35* 32* 36*  CREATININE 5.57* 4.86* 5.16* 4.68* 4.98*  CALCIUM 8.3* 7.8* 8.2* 8.2* 8.4*  MG 2.3 2.0 2.1 2.1 2.1  PHOS 3.8 3.6 3.5 3.2 3.7    Liver Function Tests: Recent Labs  Lab 04/01/19 0530  04/02/19 0730  04/03/19 1624 04/03/19 2016 04/04/19 0002 04/04/19 0430 04/04/19 0750  AST 66*  --  32  --   --   --   --   --   --   ALT 33  --  29  --   --   --   --   --   --   ALKPHOS 174*  --  68  --   --   --   --   --   --   BILITOT 0.8  --  0.9  --   --   --   --   --   --   PROT 8.2*  --  6.2*  --    --   --   --   --   --   ALBUMIN 4.6   < > 3.4*   < > 3.3* 3.1* 2.9* 2.9* 2.9*   < > = values in this interval not displayed.   Recent Labs  Lab 04/01/19 0530  LIPASE 40   No results for input(s): AMMONIA in the last 168 hours.  CBC: Recent Labs  Lab 04/01/19 0530 04/02/19 0730 04/03/19 0524 04/04/19 0430  WBC 26.2* 9.0 8.5 8.9  NEUTROABS 14.1*  --   --   --   HGB 13.7 10.9* 9.5* 9.5*  HCT 47.2 35.8* 29.8* 29.7*  MCV 104.7* 100.8* 99.0 94.6  PLT 414* 232 163 145*    Cardiac Enzymes: Recent Labs  Lab 04/01/19 1601 04/01/19 2155 04/02/19 2029 04/03/19 0524 04/03/19 0738 04/03/19 1624  CKTOTAL  --   --   --   --   --  894*  TROPONINI 0.72* 0.93* 0.38* 0.42*  0.46*  --     BNP: Invalid input(s): POCBNP  CBG: Recent Labs  Lab 04/03/19 1620 04/03/19 2049 04/04/19 0000 04/04/19 0429 04/04/19 0732  GLUCAP 279* 260* 268* 215* 47*    Microbiology: Results for orders placed or performed during the hospital encounter of 04/01/19  Culture, blood (routine x 2)     Status: None (Preliminary result)   Collection Time: 04/01/19  5:30 AM   Specimen: BLOOD  Result Value Ref Range Status   Specimen Description BLOOD BLOOD LEFT HAND  Final   Special Requests   Final    BOTTLES DRAWN AEROBIC AND ANAEROBIC Blood Culture results may not be optimal due to an inadequate volume of blood received in culture bottles   Culture   Final    NO GROWTH 2 DAYS Performed at [facility], [address], [place], [place] [address]    Report Status PENDING  Incomplete  Urine culture     Status: None   Collection Time: 04/01/19  5:30 AM   Specimen: Urine, Random  Result Value Ref Range Status   Specimen Description   Final    URINE, RANDOM Performed at [facility], [address] [place], [place] [address]    Special Requests   Final    NONE Performed at [facility], [address], [place], [place] [address]    Culture   Final    NO  GROWTH Performed at [facility], [place] [address], [place], [place] [address]    Report Status 04/02/2019 FINAL  Final  SARS Coronavirus 2 (CEPHEID - Performed in [place] hospital lab), Hosp Order     Status: None   Collection Time: 04/01/19  5:30 AM   Specimen: Nasopharyngeal Swab  Result Value Ref Range Status   SARS Coronavirus 2 NEGATIVE NEGATIVE Final    Comment: (NOTE) If result is NEGATIVE SARS-CoV-2 target nucleic acids are NOT DETECTED. The SARS-CoV-2 RNA is generally detectable in upper and lower  respiratory specimens during the acute phase of infection. The lowest  concentration of SARS-CoV-2 viral copies this assay can detect is 250  copies / mL. A negative result does not preclude SARS-CoV-2 infection  and should not be used as the sole basis for treatment or other  patient management decisions.  A negative result may occur with  improper specimen collection / handling, submission of specimen other  than nasopharyngeal swab, presence of viral mutation(s) within the  areas targeted by this assay, and inadequate number of viral copies  (<250 copies / mL). A negative result must be combined with clinical  observations, patient history, and epidemiological information. If result is POSITIVE SARS-CoV-2 target nucleic acids are DETECTED. The SARS-CoV-2 RNA is generally detectable in upper and lower  respiratory specimens dur ing the acute phase of infection.  Positive  results are indicative of active infection with SARS-CoV-2.  Clinical  correlation with patient history and other diagnostic information is  necessary to determine patient infection status.  Positive results do  not rule out bacterial infection or co-infection with other viruses. If result is PRESUMPTIVE POSTIVE SARS-CoV-2 nucleic acids MAY BE PRESENT.   A presumptive positive result was obtained on the submitted specimen  and confirmed on repeat testing.  While 2019 novel coronavirus  (SARS-CoV-2)  nucleic acids may be present in the submitted sample  additional confirmatory testing may be necessary for epidemiological  and / or clinical management purposes  to differentiate between  SARS-CoV-2 and other Sarbecovirus  currently known to infect humans.  If clinically indicated additional testing with an alternate test  methodology [phone]) is advised. The SARS-CoV-2 RNA is generally  detectable in upper and lower respiratory sp ecimens during the acute  phase of infection. The expected result is Negative. Fact Sheet for Patients:  StrictlyIdeas.no Fact Sheet for Healthcare Providers: BankingDealers.co.za This test is not yet approved or cleared by the [place] FDA and has been authorized for detection and/or diagnosis of SARS-CoV-2 by FDA under an Emergency Use Authorization (EUA).  This EUA will remain in effect (meaning this test can be used) for the duration of the COVID-19 declaration under Section 564(b)(1) of the Act, 21 U.S.C. section 360bbb-3(b)(1), unless the authorization is terminated or revoked sooner. Performed at [facility], [address] [place], [place] [address]   Culture, blood (routine x 2)     Status: None (Preliminary result)   Collection Time: 04/01/19  6:49 AM   Specimen: BLOOD  Result Value Ref Range Status   Specimen Description BLOOD BLOOD RIGHT HAND  Final   Special Requests   Final    BOTTLES DRAWN AEROBIC AND ANAEROBIC Blood Culture results may not be optimal due to an excessive volume of blood received in culture bottles   Culture   Final    NO GROWTH 2 DAYS Performed at [facility], [address] [place], [place] [address]    Report Status PENDING  Incomplete  MRSA PCR Screening     Status: None   Collection Time: 04/01/19 10:58 AM   Specimen: Nasal Mucosa; Nasopharyngeal  Result Value Ref Range Status   MRSA by PCR NEGATIVE NEGATIVE Final    Comment:        The  GeneXpert MRSA Assay (FDA approved for NASAL specimens only), is one component of a comprehensive MRSA colonization surveillance program. It is not intended to diagnose MRSA infection nor to guide or monitor treatment for MRSA infections. Performed at [facility], [address] [place], [place] [address]   Culture, respiratory (non-expectorated)     Status: None   Collection Time: 04/01/19 12:50 PM   Specimen: Tracheal Aspirate; Respiratory  Result Value Ref Range Status   Specimen Description   Final    TRACHEAL ASPIRATE Performed at [facility], [address] [place], [place] [address]    Special Requests   Final    NONE Performed at [facility], [address] [place], [place] [address]    Gram Stain   Final    MODERATE WBC PRESENT, PREDOMINANTLY PMN FEW SQUAMOUS EPITHELIAL CELLS PRESENT ABUNDANT GRAM POSITIVE COCCI RARE GRAM POSITIVE RODS Performed at [facility], [place] [address], [place], [place] [address]    Culture FEW METHICILLIN RESISTANT STAPHYLOCOCCUS AUREUS  Final   Report Status 04/04/2019 FINAL  Final   Organism ID, Bacteria METHICILLIN RESISTANT STAPHYLOCOCCUS AUREUS  Final      Susceptibility   Methicillin resistant staphylococcus aureus - MIC*    CIPROFLOXACIN <=0.5 SENSITIVE Sensitive     ERYTHROMYCIN <=0.25 SENSITIVE Sensitive     GENTAMICIN <=0.5 SENSITIVE Sensitive     OXACILLIN >=4 RESISTANT Resistant     TETRACYCLINE <=1 SENSITIVE Sensitive     VANCOMYCIN <=0.5 SENSITIVE Sensitive     TRIMETH/SULFA <=10 SENSITIVE Sensitive     CLINDAMYCIN <=0.25 SENSITIVE Sensitive     RIFAMPIN <=0.5 SENSITIVE Sensitive     Inducible Clindamycin NEGATIVE Sensitive     * FEW METHICILLIN RESISTANT STAPHYLOCOCCUS AUREUS  Coagulation Studies: No results for input(s): LABPROT, INR in the last 72 hours.  Urinalysis: No results for input(s): COLORURINE, LABSPEC, PHURINE, GLUCOSEU, HGBUR, BILIRUBINUR, KETONESUR,  PROTEINUR, UROBILINOGEN, NITRITE, LEUKOCYTESUR in the last 72 hours.  Invalid input(s): APPERANCEUR    Imaging: US Renal  Result Date: 04/03/2019 CLINICAL DATA:  Acute renal failure, history diabetes mellitus, hypertension EXAM: RENAL / URINARY TRACT ULTRASOUND COMPLETE COMPARISON:  None FINDINGS: Right Kidney: Renal measurements: 13.6 x 7.2 x 6.4 cm = volume: 328 mL. Normal cortical thickness and echogenicity. No mass, hydronephrosis or shadowing calcification. Left Kidney: Not identified in the LEFT renal fossa. No LEFT renal tissue identified in the pelvis. Bladder: Decompressed, contains minimal urine, poorly assessed. Incidentally noted echogenic hepatic parenchyma, likely fatty infiltration though this can be seen with cirrhosis and certain infiltrative disorders. Visualized hepatic margins appear smooth. IMPRESSION: Normal appearing RIGHT kidney. Probable fatty infiltration of liver as above. Nonvisualization of LEFT kidney; this could be due to congenital or surgical absence, isoechogenicity with retroperitoneal fat making it inapparent, or ectopic in position. Recommend correlation with patient history. If localization of the LEFT kidney is desired, consider CT. Electronically Signed   By: Lavonia Dana M.D.   On: 04/03/2019 16:17   Dg Chest Port 1 View  Result Date: 04/03/2019 CLINICAL DATA:  Acute respiratory failure. EXAM: PORTABLE CHEST 1 VIEW COMPARISON:  04/02/2019 FINDINGS: Endotracheal tube terminates 4.5 cm above the carina. Bilateral jugular catheters terminate over the SVC, unchanged. Enteric tube courses into the abdomen with tip not imaged. The cardiomediastinal silhouette is unchanged. Retrocardiac opacity in the left lung base has decreased. No overt pulmonary edema, large pleural effusion, or pneumothorax is identified. IMPRESSION: Improved left basilar aeration. Electronically Signed   By: Logan Bores M.D.   On: 04/03/2019 07:30   Dg Chest Port 1 View  Result Date:  04/02/2019 CLINICAL DATA:  Central line placement. Acute respiratory failure with hypoxia. Sepsis. On ventilator. EXAM: PORTABLE CHEST 1 VIEW COMPARISON:  04/01/2019 FINDINGS: A new left jugular dual-lumen central venous catheter is seen with tip overlying the distal SVC. Endotracheal tube, right jugular catheter and nasogastric tube remain in appropriate position. No pneumothorax visualized. Heart size is stable. Persistent opacity in the left retrocardiac lung base results in silhouetting of the left hemidiaphragm, and is unchanged. Right lung remains clear. IMPRESSION: 1. New left jugular dual-lumen central venous catheter in appropriate position. No evidence of pneumothorax. 2. Stable left retrocardiac atelectasis versus infiltrate. Electronically Signed   By: Earle Gell M.D.   On: 04/02/2019 11:50     Medications:   . fentaNYL infusion INTRAVENOUS 200 mcg/hr (04/04/19 0800)  . levETIRAcetam Stopped (04/04/19 0534)  . norepinephrine (LEVOPHED) Adult infusion Stopped (04/03/19 1300)  . piperacillin-tazobactam (ZOSYN)  IV 12.5 mL/hr at 04/04/19 0800  . propofol (DIPRIVAN) infusion 40 mcg/kg/min (04/04/19 0800)  . pureflow 3 each (04/04/19 0030)   . B-complex with vitamin C  1 tablet Per Tube Daily  . chlorhexidine gluconate (MEDLINE KIT)  15 mL Mouth Rinse BID  . Chlorhexidine Gluconate Cloth  6 each Topical Daily  . feeding supplement (PRO-STAT SUGAR FREE 64)  60 mL Per Tube 5 X Daily  . feeding supplement (VITAL HIGH PROTEIN)  1,000 mL Per Tube Q24H  . insulin aspart  0-15 Units Subcutaneous Q4H  . insulin glargine  20 Units Subcutaneous BID  . mouth rinse  15 mL Mouth Rinse 10 times per day  . pantoprazole sodium  40 mg Per Tube Daily  . sodium chloride  flush  10-40 mL Intracatheter Q12H  . sodium chloride flush  3 mL Intravenous Q12H   acetaminophen **OR** acetaminophen, albuterol, heparin, ondansetron **OR** ondansetron (ZOFRAN) IV, polyethylene glycol, sodium chloride flush,  vecuronium  Assessment/ Plan:    Mr. [place] Oconnell is a 43 y.o. black male with diabetes mellitus type 2, hypertension, hyperlipidemia, prior history of kidney donation, chronic kidney disease stage III baseline creatinine 1.5 on August 27, 2018, who was admitted to [facility] on 04/01/2019 for evaluation of seizures and loss of consciousness.  1.  Acute renal failure on chronic kidney disease stage III baseline creatinine 1.5 08/27/2018 with history of renal donation.   Requiring renal replacement therapy.  CRRT until this morning If no improvement, will need intermittent hemodialysis Will monitor daily for dialysis need.   2.  Acute respiratory failure. Requiring mechanical ventilation.   3.  Anemia with chronic kidney disease: hemoglobin 9.5   LOS: 3 [place] Oconnell 6/15/202010:30 AM

## 2019-04-05 LAB — BLOOD GAS, ARTERIAL
Acid-base deficit: 3.3 mmol/L — ABNORMAL HIGH (ref 0.0–2.0)
Bicarbonate: 22.1 mmol/L (ref 20.0–28.0)
FIO2: 0.3
MECHVT: 500 mL
O2 Saturation: 91.6 %
PEEP: 8 cmH2O
Patient temperature: 37.6
RATE: 25 resp/min
pCO2 arterial: 40 mmHg (ref 32.0–48.0)
pH, Arterial: 7.34 — ABNORMAL LOW (ref 7.350–7.450)
pO2, Arterial: 69 mmHg — ABNORMAL LOW (ref 83.0–108.0)

## 2019-04-05 LAB — CBC
HCT: 29.7 % — ABNORMAL LOW (ref 39.0–52.0)
Hemoglobin: 9.7 g/dL — ABNORMAL LOW (ref 13.0–17.0)
MCH: 30 pg (ref 26.0–34.0)
MCHC: 32.7 g/dL (ref 30.0–36.0)
MCV: 92 fL (ref 80.0–100.0)
Platelets: 207 10*3/uL (ref 150–400)
RBC: 3.23 MIL/uL — ABNORMAL LOW (ref 4.22–5.81)
RDW: 13.8 % (ref 11.5–15.5)
WBC: 15.4 10*3/uL — ABNORMAL HIGH (ref 4.0–10.5)
nRBC: 1.5 % — ABNORMAL HIGH (ref 0.0–0.2)

## 2019-04-05 LAB — CBC WITH DIFFERENTIAL/PLATELET
Abs Immature Granulocytes: 0.18 10*3/uL — ABNORMAL HIGH (ref 0.00–0.07)
Basophils Absolute: 0 10*3/uL (ref 0.0–0.1)
Basophils Relative: 0 %
Eosinophils Absolute: 0.7 10*3/uL — ABNORMAL HIGH (ref 0.0–0.5)
Eosinophils Relative: 8 %
HCT: 27.9 % — ABNORMAL LOW (ref 39.0–52.0)
Hemoglobin: 9 g/dL — ABNORMAL LOW (ref 13.0–17.0)
Immature Granulocytes: 2 %
Lymphocytes Relative: 13 %
Lymphs Abs: 1.3 10*3/uL (ref 0.7–4.0)
MCH: 30.4 pg (ref 26.0–34.0)
MCHC: 32.3 g/dL (ref 30.0–36.0)
MCV: 94.3 fL (ref 80.0–100.0)
Monocytes Absolute: 1.1 10*3/uL — ABNORMAL HIGH (ref 0.1–1.0)
Monocytes Relative: 11 %
Neutro Abs: 6.5 10*3/uL (ref 1.7–7.7)
Neutrophils Relative %: 66 %
Platelets: 161 10*3/uL (ref 150–400)
RBC: 2.96 MIL/uL — ABNORMAL LOW (ref 4.22–5.81)
RDW: 13.8 % (ref 11.5–15.5)
WBC: 9.9 10*3/uL (ref 4.0–10.5)
nRBC: 0.5 % — ABNORMAL HIGH (ref 0.0–0.2)

## 2019-04-05 LAB — BASIC METABOLIC PANEL
Anion gap: 11 (ref 5–15)
BUN: 55 mg/dL — ABNORMAL HIGH (ref 6–20)
CO2: 23 mmol/L (ref 22–32)
Calcium: 8.1 mg/dL — ABNORMAL LOW (ref 8.9–10.3)
Chloride: 103 mmol/L (ref 98–111)
Creatinine, Ser: 7.71 mg/dL — ABNORMAL HIGH (ref 0.61–1.24)
GFR calc Af Amer: 9 mL/min — ABNORMAL LOW (ref >60)
GFR calc non Af Amer: 8 mL/min — ABNORMAL LOW (ref >60)
Glucose, Bld: 251 mg/dL — ABNORMAL HIGH (ref 70–99)
Potassium: 3.8 mmol/L (ref 3.5–5.1)
Sodium: 137 mmol/L (ref 135–145)

## 2019-04-05 LAB — GLUCOSE, CAPILLARY
Glucose-Capillary: 197 mg/dL — ABNORMAL HIGH (ref 70–99)
Glucose-Capillary: 218 mg/dL — ABNORMAL HIGH (ref 70–99)
Glucose-Capillary: 296 mg/dL — ABNORMAL HIGH (ref 70–99)
Glucose-Capillary: 311 mg/dL — ABNORMAL HIGH (ref 70–99)
Glucose-Capillary: 186 mg/dL — ABNORMAL HIGH (ref 70–99)
Glucose-Capillary: 147 mg/dL — ABNORMAL HIGH (ref 70–99)
Glucose-Capillary: 177 mg/dL — ABNORMAL HIGH (ref 70–99)
Glucose-Capillary: 192 mg/dL — ABNORMAL HIGH (ref 70–99)

## 2019-04-05 LAB — MAGNESIUM: Magnesium: 2 mg/dL (ref 1.7–2.4)

## 2019-04-05 MED ORDER — VITAL HIGH PROTEIN PO LIQD: 1000.0000 mL | ORAL | Status: DC

## 2019-04-05 MED ORDER — B COMPLEX-C PO TABS
1.0000 | ORAL_TABLET | Freq: Every day | ORAL | Status: DC
  Filled 2019-04-05 (×2): qty 1

## 2019-04-05 MED ORDER — EPOETIN ALFA 10000 UNIT/ML IJ SOLN
10000.0000 [IU] | INTRAMUSCULAR | Status: DC
  Administered 2019-04-05 – 2019-04-14 (×5): 10000 [IU] via INTRAVENOUS

## 2019-04-05 MED ORDER — ALTEPLASE 2 MG IJ SOLR
2.0000 mg | Freq: Once | INTRAMUSCULAR | Status: AC
  Administered 2019-04-05: 06:00:00 2 mg
  Filled 2019-04-05: qty 2

## 2019-04-05 MED ORDER — VANCOMYCIN HCL IN DEXTROSE 1-5 GM/200ML-% IV SOLN
1000.0000 mg | Freq: Once | INTRAVENOUS | Status: AC
  Administered 2019-04-05: 12:00:00 1000 mg via INTRAVENOUS
  Filled 2019-04-05: qty 200

## 2019-04-05 MED ORDER — PRO-STAT SUGAR FREE PO LIQD: 60.0000 mL | Freq: Three times a day (TID) | ORAL | Status: DC

## 2019-04-05 MED ORDER — HYDRALAZINE HCL 20 MG/ML IJ SOLN
10.0000 mg | Freq: Four times a day (QID) | INTRAMUSCULAR | Status: DC | PRN
  Administered 2019-04-05 – 2019-04-07 (×4): 20 mg via INTRAVENOUS
  Filled 2019-04-05 (×4): qty 1

## 2019-04-05 MED ORDER — STERILE WATER FOR INJECTION IJ SOLN
INTRAMUSCULAR | Status: AC
  Administered 2019-04-05: 06:00:00 10 mL
  Filled 2019-04-05: qty 10

## 2019-04-05 MED ORDER — LABETALOL HCL 5 MG/ML IV SOLN
10.0000 mg | INTRAVENOUS | Status: DC | PRN
  Administered 2019-04-05 – 2019-04-07 (×3): 10 mg via INTRAVENOUS
  Filled 2019-04-05 (×4): qty 4

## 2019-04-05 NOTE — Progress Notes (Signed)
[facility]  ROUNDING NOTE   Subjective:  CRRT stopped yesterday morning.   Anuric   On weaning trial this morning.   Objective:  Vital signs in last 24 hours:  Temp:  [98.2 F (36.8 C)-100.4 F (38 C)] 100.4 F (38 C) (06/16 0800) Pulse Rate:  [25-92] 92 (06/16 0800) Resp:  [17-25] 25 (06/16 0800) BP: (108-155)/(66-91) 155/91 (06/16 0800) SpO2:  [94 %-100 %] 95 % (06/16 0800) FiO2 (%):  [30 %] 30 % (06/16 0907) Weight:  [139.2 kg] 139.2 kg (06/16 0500)  Weight change: 2.2 kg Filed Weights   04/02/19 0500 04/04/19 0500 04/05/19 0500  Weight: (!) 137 kg (!) 137 kg (!) 139.2 kg    Intake/Output: I/O last 3 completed shifts: In: 3764.7 [I.V.:2016; NG/GT:1191.3; IV Piggyback:557.4] Out: 125 [Stool:125]   Intake/Output this shift:  Total I/O In: 61.3 [I.V.:61.3] Out: -   Physical Exam: General: Critically ill  Head: ETT  Eyes: Anicteric  Neck: trachea midline  Lungs:   Bilateral rales and rhonchi, PRVC 30%  Heart: regular  Abdomen:  Soft, obese, +bowel sounds  Extremities: 1+ peripheral edema.  Neurologic: Intubated, sedated  Skin: No lesions  Access: L IJ temporary dialysis catheter    Basic Metabolic Panel: Recent Labs  Lab 04/03/19 1624 04/03/19 2016 04/04/19 0002 04/04/19 0430 04/04/19 0750 04/04/19 1208 04/04/19 1538 04/05/19 0434  NA 135 131* 134* 136 136  --   --  137  K 4.3 4.0 3.8 3.2* 3.9  --   --  3.8  CL 100 98 102 102 102  --   --  103  CO2 23 21* _0 --   --  23  GLUCOSE 284* 264* 269* 206* 280*  --   --  251*  BUN 33* 31* 35* 32* 36*  --   --  55*  CREATININE 5.57* 4.86* 5.16* 4.68* 4.98*  --   --  7.71*  CALCIUM 8.3* 7.8* 8.2* 8.2* 8.4*  --   --  8.1*  MG 2.3 2.0 2.1 2.1 2.1 2.1 2.0 2.0  PHOS 3.8 3.6 3.5 3.2 3.7  --   --   --     Liver Function Tests: Recent Labs  Lab 04/01/19 0530  04/02/19 0730  04/03/19 1624 04/03/19 2016 04/04/19 0002 04/04/19 0430 04/04/19 0750  AST 66*  --  32  --   --   --   --    --   --   ALT 33  --  29  --   --   --   --   --   --   ALKPHOS 174*  --  68  --   --   --   --   --   --   BILITOT 0.8  --  0.9  --   --   --   --   --   --   PROT 8.2*  --  6.2*  --   --   --   --   --   --   ALBUMIN 4.6   < > 3.4*   < > 3.3* 3.1* 2.9* 2.9* 2.9*   < > = values in this interval not displayed.   Recent Labs  Lab 04/01/19 0530  LIPASE 40   No results for input(s): AMMONIA in the last 168 hours.  CBC: Recent Labs  Lab 04/01/19 0530 04/02/19 0730 04/03/19 0524 04/04/19 0430 04/05/19 0434  WBC 26.2* 9.0 8.5 8.9 9.9  NEUTROABS  14.1*  --   --   --  6.5  HGB 13.7 10.9* 9.5* 9.5* 9.0*  HCT 47.2 35.8* 29.8* 29.7* 27.9*  MCV 104.7* 100.8* 99.0 94.6 94.3  PLT 414* 232 163 145* 161    Cardiac Enzymes: Recent Labs  Lab 04/01/19 1601 04/01/19 2155 04/02/19 2029 04/03/19 0524 04/03/19 0738 04/03/19 1624  CKTOTAL  --   --   --   --   --  894*  TROPONINI 0.72* 0.93* 0.38* 0.42* 0.46*  --     BNP: Invalid input(s): POCBNP  CBG: Recent Labs  Lab 04/04/19 1520 04/04/19 1945 04/04/19 2331 04/05/19 0408 04/05/19 0751  GLUCAP 245* 255* 197* 218* 48*    Microbiology: Results for orders placed or performed during the hospital encounter of 04/01/19  Culture, blood (routine x 2)     Status: None (Preliminary result)   Collection Time: 04/01/19  5:30 AM   Specimen: BLOOD  Result Value Ref Range Status   Specimen Description BLOOD BLOOD LEFT HAND  Final   Special Requests   Final    BOTTLES DRAWN AEROBIC AND ANAEROBIC Blood Culture results may not be optimal due to an inadequate volume of blood received in culture bottles   Culture   Final    NO GROWTH 4 DAYS Performed at [facility], [address] [place], [place] [address]    Report Status PENDING  Incomplete  Urine culture     Status: None   Collection Time: 04/01/19  5:30 AM   Specimen: Urine, Random  Result Value Ref Range Status   Specimen Description   Final    URINE,  RANDOM Performed at [facility], [address], [place], [place] [address]    Special Requests   Final    NONE Performed at [facility], [address] [place], [place] [address]    Culture   Final    NO GROWTH Performed at [facility], [place] [address] [place], [place] [address]    Report Status 04/02/2019 FINAL  Final  SARS Coronavirus 2 (CEPHEID - Performed in [place] hospital lab), Hosp Order     Status: None   Collection Time: 04/01/19  5:30 AM   Specimen: Nasopharyngeal Swab  Result Value Ref Range Status   SARS Coronavirus 2 NEGATIVE NEGATIVE Final    Comment: (NOTE) If result is NEGATIVE SARS-CoV-2 target nucleic acids are NOT DETECTED. The SARS-CoV-2 RNA is generally detectable in upper and lower  respiratory specimens during the acute phase of infection. The lowest  concentration of SARS-CoV-2 viral copies this assay can detect is 250  copies / mL. A negative result does not preclude SARS-CoV-2 infection  and should not be used as the sole basis for treatment or other  patient management decisions.  A negative result may occur with  improper specimen collection / handling, submission of specimen other  than nasopharyngeal swab, presence of viral mutation(s) within the  areas targeted by this assay, and inadequate number of viral copies  (<250 copies / mL). A negative result must be combined with clinical  observations, patient history, and epidemiological information. If result is POSITIVE SARS-CoV-2 target nucleic acids are DETECTED. The SARS-CoV-2 RNA is generally detectable in upper and lower  respiratory specimens dur ing the acute phase of infection.  Positive  results are indicative of active infection with SARS-CoV-2.  Clinical  correlation with patient history and other diagnostic information is  necessary to determine patient infection status.  Positive  results do  not rule out bacterial infection or co-infection with other  viruses. If result is PRESUMPTIVE POSTIVE SARS-CoV-2 nucleic acids MAY BE PRESENT.   A presumptive positive result was obtained on the submitted specimen  and confirmed on repeat testing.  While 2019 novel coronavirus  (SARS-CoV-2) nucleic acids may be present in the submitted sample  additional confirmatory testing may be necessary for epidemiological  and / or clinical management purposes  to differentiate between  SARS-CoV-2 and other Sarbecovirus currently known to infect humans.  If clinically indicated additional testing with an alternate test  methodology [phone]) is advised. The SARS-CoV-2 RNA is generally  detectable in upper and lower respiratory sp ecimens during the acute  phase of infection. The expected result is Negative. Fact Sheet for Patients:  StrictlyIdeas.no Fact Sheet for Healthcare Providers: BankingDealers.co.za This test is not yet approved or cleared by the [place] FDA and has been authorized for detection and/or diagnosis of SARS-CoV-2 by FDA under an Emergency Use Authorization (EUA).  This EUA will remain in effect (meaning this test can be used) for the duration of the COVID-19 declaration under Section 564(b)(1) of the Act, 21 U.S.C. section 360bbb-3(b)(1), unless the authorization is terminated or revoked sooner. Performed at [facility], [address] [place], [place] [address]   Culture, blood (routine x 2)     Status: None (Preliminary result)   Collection Time: 04/01/19  6:49 AM   Specimen: BLOOD  Result Value Ref Range Status   Specimen Description BLOOD BLOOD RIGHT HAND  Final   Special Requests   Final    BOTTLES DRAWN AEROBIC AND ANAEROBIC Blood Culture results may not be optimal due to an excessive volume of blood received in culture bottles   Culture   Final    NO GROWTH 4 DAYS Performed at [facility], [address], [place], [place] [address]    Report  Status PENDING  Incomplete  MRSA PCR Screening     Status: None   Collection Time: 04/01/19 10:58 AM   Specimen: Nasal Mucosa; Nasopharyngeal  Result Value Ref Range Status   MRSA by PCR NEGATIVE NEGATIVE Final    Comment:        The GeneXpert MRSA Assay (FDA approved for NASAL specimens only), is one component of a comprehensive MRSA colonization surveillance program. It is not intended to diagnose MRSA infection nor to guide or monitor treatment for MRSA infections. Performed at [facility], [address] [place], [place] [address]   Culture, respiratory (non-expectorated)     Status: None   Collection Time: 04/01/19 12:50 PM   Specimen: Tracheal Aspirate; Respiratory  Result Value Ref Range Status   Specimen Description   Final    TRACHEAL ASPIRATE Performed at [facility], [address] [place], [place] [address]    Special Requests   Final    NONE Performed at [facility], [address] [place], [place] [address]    Gram Stain   Final    MODERATE WBC PRESENT, PREDOMINANTLY PMN FEW SQUAMOUS EPITHELIAL CELLS PRESENT ABUNDANT GRAM POSITIVE COCCI RARE GRAM POSITIVE RODS Performed at [facility], [place] [address], [place], [place] [address]    Culture FEW METHICILLIN RESISTANT STAPHYLOCOCCUS AUREUS  Final   Report Status 04/04/2019 FINAL  Final   Organism ID, Bacteria METHICILLIN RESISTANT STAPHYLOCOCCUS AUREUS  Final      Susceptibility   Methicillin resistant staphylococcus aureus - MIC*    CIPROFLOXACIN <=0.5 SENSITIVE  Sensitive     ERYTHROMYCIN <=0.25 SENSITIVE Sensitive     GENTAMICIN <=0.5 SENSITIVE Sensitive     OXACILLIN >=4 RESISTANT Resistant     TETRACYCLINE <=1 SENSITIVE Sensitive     VANCOMYCIN <=0.5 SENSITIVE Sensitive     TRIMETH/SULFA <=10 SENSITIVE Sensitive     CLINDAMYCIN <=0.25 SENSITIVE Sensitive     RIFAMPIN <=0.5 SENSITIVE Sensitive     Inducible Clindamycin NEGATIVE Sensitive     * FEW METHICILLIN  RESISTANT STAPHYLOCOCCUS AUREUS  C difficile quick scan w PCR reflex     Status: None   Collection Time: 04/04/19  9:07 PM   Specimen: STOOL  Result Value Ref Range Status   C Diff antigen NEGATIVE NEGATIVE Final   C Diff toxin NEGATIVE NEGATIVE Final   C Diff interpretation No C. difficile detected.  Final    Comment: Performed at [facility], [address] [place], [place] [address]    Coagulation Studies: No results for input(s): LABPROT, INR in the last 72 hours.  Urinalysis: No results for input(s): COLORURINE, LABSPEC, PHURINE, GLUCOSEU, HGBUR, BILIRUBINUR, KETONESUR, PROTEINUR, UROBILINOGEN, NITRITE, LEUKOCYTESUR in the last 72 hours.  Invalid input(s): APPERANCEUR    Imaging: US Renal  Result Date: 04/03/2019 CLINICAL DATA:  Acute renal failure, history diabetes mellitus, hypertension EXAM: RENAL / URINARY TRACT ULTRASOUND COMPLETE COMPARISON:  None FINDINGS: Right Kidney: Renal measurements: 13.6 x 7.2 x 6.4 cm = volume: 328 mL. Normal cortical thickness and echogenicity. No mass, hydronephrosis or shadowing calcification. Left Kidney: Not identified in the LEFT renal fossa. No LEFT renal tissue identified in the pelvis. Bladder: Decompressed, contains minimal urine, poorly assessed. Incidentally noted echogenic hepatic parenchyma, likely fatty infiltration though this can be seen with cirrhosis and certain infiltrative disorders. Visualized hepatic margins appear smooth. IMPRESSION: Normal appearing RIGHT kidney. Probable fatty infiltration of liver as above. Nonvisualization of LEFT kidney; this could be due to congenital or surgical absence, isoechogenicity with retroperitoneal fat making it inapparent, or ectopic in position. Recommend correlation with patient history. If localization of the LEFT kidney is desired, consider CT. Electronically Signed   By: Lavonia Dana M.D.   On: 04/03/2019 16:17     Medications:   . dexmedetomidine (PRECEDEX) IV infusion 0.6  mcg/kg/hr (04/05/19 0800)  . feeding supplement (VITAL HIGH PROTEIN)    . fentaNYL infusion INTRAVENOUS 100 mcg/hr (04/05/19 0800)  . levETIRAcetam Stopped (04/04/19 1720)   . B-complex with vitamin C  1 tablet Per Tube QHS  . chlorhexidine gluconate (MEDLINE KIT)  15 mL Mouth Rinse BID  . Chlorhexidine Gluconate Cloth  6 each Topical Daily  . feeding supplement (PRO-STAT SUGAR FREE 64)  60 mL Per Tube TID  . insulin aspart  0-15 Units Subcutaneous Q4H  . insulin aspart  3 Units Subcutaneous Q4H  . insulin glargine  25 Units Subcutaneous BID  . mouth rinse  15 mL Mouth Rinse 10 times per day  . pantoprazole sodium  40 mg Per Tube Daily  . sodium chloride flush  10-40 mL Intracatheter Q12H  . sodium chloride flush  3 mL Intravenous Q12H  . vancomycin variable dose per unstable renal function (pharmacist dosing)   Does not apply See admin instructions   acetaminophen **OR** acetaminophen, albuterol, heparin, ondansetron **OR** ondansetron (ZOFRAN) IV, polyethylene glycol, sodium chloride flush, vecuronium  Assessment/ Plan:    Roy Oconnell is a 43 y.o. black male with diabetes mellitus type 2, hypertension, hyperlipidemia, prior history of kidney donation, chronic kidney disease stage III  baseline creatinine 1.5 on August 27, 2018, who was admitted to [facility] on 04/01/2019 for evaluation of seizures and loss of consciousness.  1.  Acute renal failure on chronic kidney disease stage III baseline creatinine 1.5 08/27/2018 with history of renal donation.   Requiring renal replacement therapy.  CRRT stopped on 6/15.  Anuric.  - Intermittent hemodialysis scheduled for today. Orders prepared.   2.  Acute respiratory failure. Requiring mechanical ventilation. Weaning parameters this morning.   3.  Anemia with chronic kidney disease: hemoglobin 9 - EPO with HD treatment  4. Seizures: started on levitiracetam. Will need to be renally dosed.    LOS: 4 Roy Oconnell 6/16/20209:40  AM

## 2019-04-05 NOTE — Progress Notes (Signed)
HD Tx completed tolerated well.    04/05/19 1700  Vital Signs  Temp (!) 100.4 F (38 C)  Temp Source Bladder  Pulse Rate (!) 109  Pulse Rate Source Monitor  Resp (!) 32  BP (!) 178/81  BP Location Left Arm  BP Method Automatic  Patient Position (if appropriate) Lying  Oxygen Therapy  SpO2 99 %  O2 Device Nasal Cannula  O2 Flow Rate (L/min) 4 L/min  During Hemodialysis Assessment  HD Safety Checks Performed Yes  KECN 29.6 KECN  Dialysis Fluid Bolus Normal Saline  Bolus Amount (mL) 250 mL  Intra-Hemodialysis Comments Tx completed;Tolerated well

## 2019-04-05 NOTE — Progress Notes (Signed)
Inpatient Diabetes Program Recommendations  AACE/ADA: New Consensus Statement on Inpatient Glycemic Control (2015)  Target Ranges:  Prepandial:   less than 140 mg/dL      Peak postprandial:   less than 180 mg/dL (1-2 hours)      Critically ill patients:  140 - 180 mg/dL   Lab Results  Component Value Date   GLUCAP 296 (H) 04/05/2019   HGBA1C 14.8 (H) 04/01/2019    Review of Glycemic Control Results for Roy Oconnell, Roy Oconnell (MRN [id number]) as of 04/05/2019 09:04  Ref. Range 04/04/2019 23:31 04/05/2019 04:08 04/05/2019 07:51  Glucose-Capillary Latest Ref Range: 70 - 99 mg/dL 197 (H) 218 (H) 296 (H)   Diabetes history: DM 2 Home DM Meds: Glipizide 10 mg BID   Current Orders: Lantus 25 units BID                            Novolog Moderate Correction Scale/ SSI (0-15 units) Q4 hours, Novolog 3 units q 4 hours  Inpatient Diabetes Program Recommendations:    Consider increasing Novolog tube feed coverage to 5 units q 4 hours and increase Lantus to 30 units bid.   Thanks  Adah Perl, RN, BC-ADM Inpatient Diabetes Coordinator Pager [phone]

## 2019-04-05 NOTE — Progress Notes (Signed)
Pharmacy Antibiotic Note  Roy Oconnell is a 43 y.o. male admitted on 04/01/2019 for evaluation of seizures and loss of consciousness.  Pharmacy has been consulted for vancomycin dosing due to tracheal aspirate growing MRSA. Patient has past medical history significant for kidney donation, hypertension, hyperlipidema, CKD Stage III, and type 2 diabetes. Patient transitioned off CRRT on 6/15 and will require HD with initial session on 6/16.  Plan: -Patient with scheduled dialysis on 6/16. Will schedule vancomycin 1g IV to be infused with dialysis.  -Will monitor daily for dialysis and schedule vancomycin as appropriate.  -Monitor vancomycin trough after 3rd dose prior to dialysis. Continue to follow and monitor daily   Height: 5\' 10"  (177.8 cm) Weight: (!) 306 lb 14.1 oz (139.2 kg) IBW/kg (Calculated) : 73  Temp (24hrs), Avg:99.4 F (37.4 C), Min:98.2 F (36.8 C), Max:100.4 F (38 C)  Recent Labs  Lab 04/01/19 0530 04/01/19 0818  04/01/19 1206  04/02/19 0730  04/02/19 2029 04/03/19 0524  04/03/19 2016 04/04/19 0002 04/04/19 0430 04/04/19 0750 04/04/19 1208 04/05/19 0434  WBC 26.2*  --   --   --   --  9.0  --   --  8.5  --   --   --  8.9  --   --  9.9  CREATININE 2.30*  --    < >  --    < > 4.64*   < > 4.48* 5.37*   < > 4.86* 5.16* 4.68* 4.98*  --  7.71*  LATICACIDVEN >11.0* 10.5*  --  7.2*  --   --   --  1.4  --   --   --   --   --   --   --   --   VANCORANDOM  --   --   --   --   --   --   --   --   --   --   --   --   --   --  10  --    < > = values in this interval not displayed.    Estimated Creatinine Clearance: 17.7 mL/min (A) (by C-G formula based on SCr of 7.71 mg/dL (H)).    Not on File  Antimicrobials this admission: Clindamycin 6/12 x 1  Zosyn 6/12 >> 6/15  Vancomycin 6/12 x1 Vancomycin 6/15 >>   Dose adjustments this admission: 6/12 vancomycin 2500mg  (trough level is 10) 6/15 vancomycin 1g x1 6/16 Vancomycin 1g x1  Microbiology results: 6/12 BCx: no  growth x 4 days  6/12 UCx: negative  6/12 Sputum: MRSA 6/12 MRSA PCR: negative  6/12 COVID: negative  6/15 c.diff: Negative  Thank you for allowing pharmacy to be a part of this patient's care.  Marisa Cyphers, PharmD Candidate 04/05/2019 11:32 AM

## 2019-04-05 NOTE — Progress Notes (Signed)
Post HD [facility]    04/05/19 1715  Hand-Off documentation  Report given to (Full Name) Hollice Gong, RN   Report received from (Full Name) Beatris Ship  Vital Signs  Temp (!) 100.4 F (38 C)  Temp Source Bladder  Pulse Rate (!) 110  Pulse Rate Source Monitor  Resp (!) 33  BP (!) 186/84  BP Location Left Arm  BP Method Automatic  Patient Position (if appropriate) Lying  Oxygen Therapy  SpO2 99 %  O2 Device Nasal Cannula  O2 Flow Rate (L/min) 4 L/min  Pain Assessment  Pain Scale 0-10  Pain Score 0  Dialysis Weight  Weight (!) 139.2 kg  Type of Weight Post-Dialysis  Post-Hemodialysis Assessment  Rinseback Volume (mL) 250 mL  KECN 29.6 V  Dialyzer Clearance Clotted  Duration of HD Treatment -hour(s) 3 hour(s)  Hemodialysis Intake (mL) 500 mL  UF Total -Machine (mL) 500 mL  Net UF (mL) 0 mL  Tolerated HD Treatment Yes  Post-Hemodialysis Comments Unable to return blood to pt, MD made aware   Hemodialysis Catheter Left Internal jugular Triple lumen Temporary (Non-Tunneled)  Placement Date/Time: 04/02/19 1100   Placed prior to admission: No  Maximum sterile barrier precautions: Hand hygiene;Cap;Mask;Sterile gown;Sterile gloves;Large sterile sheet  Site Prep: Chlorhexidine (preferred);Skin Prep Completely Dry at the Time o...  Site Condition No complications  Blue Lumen Status Heparin locked  Red Lumen Status Heparin locked  Purple Lumen Status Capped (Central line)  Post treatment catheter status Capped and Clamped

## 2019-04-05 NOTE — Progress Notes (Signed)
[place] [facility] [place] NAME: Roy Oconnell    MR#:  [id number]  DATE OF BIRTH:  23-Feb-1976  SUBJECTIVE:  CHIEF COMPLAINT:   Chief Complaint  Patient presents with  . Seizures  . Loss of Consciousness   Patient is still on ventilation, he is awake, opened eyes on verbal stimuli. REVIEW OF SYSTEMS:  Review of Systems  Unable to perform ROS: Intubated    DRUG ALLERGIES:  Not on File VITALS:  Blood pressure (!) 155/91, pulse 92, temperature (!) 100.4 F (38 C), resp. rate (!) 25, height 5\' 10"  (1.778 m), weight (!) 139.2 kg, SpO2 95 %. PHYSICAL EXAMINATION:  Physical Exam Constitutional:      Comments: Morbid obesity.  On ventilation.  HENT:     Head: Normocephalic.  Eyes:     General: No scleral icterus.    Conjunctiva/sclera: Conjunctivae normal.     Pupils: Pupils are equal, round, and reactive to light.  Neck:     Musculoskeletal: Neck supple.     Vascular: No JVD.     Trachea: No tracheal deviation.  Cardiovascular:     Rate and Rhythm: Normal rate and regular rhythm.     Heart sounds: Normal heart sounds. No murmur. No gallop.   Pulmonary:     Effort: Pulmonary effort is normal. No respiratory distress.     Breath sounds: Normal breath sounds. No wheezing or rales.  Abdominal:     General: Bowel sounds are normal. There is no distension.     Palpations: Abdomen is soft.     Tenderness: There is no abdominal tenderness. There is no rebound.  Musculoskeletal:        General: No tenderness.  Skin:    Findings: No erythema or rash.    LABORATORY PANEL:  Male CBC Recent Labs  Lab 04/05/19 0434  WBC 9.9  HGB 9.0*  HCT 27.9*  PLT 161   ------------------------------------------------------------------------------------------------------------------ Chemistries  Recent Labs  Lab 04/02/19 0730  04/05/19 0434  NA 138   < > 137  K 4.8   < > 3.8  CL 104   < > 103  CO2 22   < > 23  GLUCOSE 322*   < > 251*   BUN 28*   < > 55*  CREATININE 4.64*   < > 7.71*  CALCIUM 8.0*   < > 8.1*  MG  --    < > 2.0  AST 32  --   --   ALT 29  --   --   ALKPHOS 68  --   --   BILITOT 0.9  --   --    < > = values in this interval not displayed.   RADIOLOGY:  No results found. ASSESSMENT AND PLAN:   *DKA, hyperglycemia and diabetes. Improved with insulin drip and IV fluid support.  Anion gap close to normal. Continue sliding scale and increased Lantus.  *New onset seizures.  Loaded with 1 dose of IV Keppra.     Per Dr. Irish Elders, Keppra 750 BID now for acute seizure prophylaxis for 3-5 days as likely provoked seizure.  Sepsis and aspiration pneumonia Continue IV Zosyn.  Blood culture is negative so far.  Leukocytosis improved.  Septic shock.  Off Levophed.  Improved.  *Acute kidney injury on CKD 3 secondary to DKA, hypotension and sepsis. Patient was treated with IV fluid, but renal function has been worsening.  He was on CRRT.  Start hemodialysis per Dr.  Kolluru.  Elevated troponin.  Possible due to demanding ischemia secondary to above.  *Acute metabolic encephalopathy due to seizures and DKA.  Presently intubated and sedated.  CT scan of the head negative.  Reassess once extubated and awake.  *Acute respiratory failure with hypoxia and hypercapnia due to above. Still on ventilation, plan to extubate today. I discussed with Dr. Patsey Berthold. All the records are reviewed and case discussed with Care. Management/Social Worker. Management plans discussed with the patient, family and they are in agreement.  CODE STATUS: Full Code  TOTAL TIME TAKING CARE OF THIS PATIENT: 28 minutes.   More than 50% of the time was spent in counseling/coordination of care: YES  POSSIBLE D/C IN ? DAYS, DEPENDING ON CLINICAL CONDITION.   [name] M.D on 04/05/2019 at 10:46 AM  Between 7am to 6pm - Pager - [phone]  After 6pm go to [email or web address] - [facility]

## 2019-04-05 NOTE — Progress Notes (Signed)
Nutrition Follow-up  RD working remotely.  DOCUMENTATION CODES:   Morbid obesity  INTERVENTION:  Initiate new goal regimen of Vital High Protein at 50 mL/hr (1200 mL goal daily volume) + Pro-Stat 60 mL TID per tube. Provides 1800 kcal, 195 grams of protein, 1008 mL H2O daily.  Continue B-complex with C daily per tube. Will change to QHS.  Continue minimum free water flush of 30 mL Q4hrs to maintain tube patency.  NUTRITION DIAGNOSIS:   Inadequate oral intake related to inability to eat as evidenced by NPO status.  Ongoing - addressing with TF regimen.  GOAL:   Provide needs based on ASPEN/SCCM guidelines  Met with TF regimen.  MONITOR:   Vent status, Labs, Weight trends, TF tolerance, I & O's  REASON FOR ASSESSMENT:   Ventilator, Consult Enteral/tube feeding initiation and management  ASSESSMENT:   43 year old male with PMHx of DM, HTN, HLD admitted with severe DKA with severe metabolic acidosis and severe respiratory failure requiring intubation on 6/12, also with seizures, aspiration PNA, and severe acute renal failure.  Patient remains intubated and sedated. On PRVC mode with FiO2 30% and PEEP 8 cmH2O. Patient's abdomen distended per RN documentation. Had 125 mL output from rectal tube yesterday. Skin remains intact. Patient was transitioned off propofol gtt yesterday. Patient now off CRRT. Plan is to monitor for need for intermittent HD.  Enteral Access: OGT placed 6/12; terminates in stomach per abdominal x-ray 6/12; 58 cm at corner of mouth  MAP: 85-95 mmHg  TF: pt tolerating Vital High Protein at 20 mL/hr + Pro-Stat 60 mL 5 times daily per tube  Patient is currently intubated on ventilator support Ve: 11.2 L/min Temp (24hrs), Avg:99.2 F (37.3 C), Min:98.2 F (36.8 C), Max:100 F (37.8 C)  Propofol: N/A  Medications reviewed and include: B-complex with C daily per tube, Novolog 0-15 units Q4hrs, Novolog 3 unit sQ4hrs, Lantus 25 units BID, pantoprazole,  Precedex gtt, fentanyl gtt, Keppra.  Labs reviewed: CBG 218-296, BUN 55, Creatinine 7.71.  I/O: pt anuric yesterday; 125 mL output from rectal tube yesterday  Weight trend: 139.2 kg on 6/16; + 2.2 kg from 6/13  Diet Order:   Diet Order    None     EDUCATION NEEDS:   No education needs have been identified at this time  Skin:  Skin Assessment: Reviewed RN Assessment  Last BM:  6/15: 125 mL per rectal tube  Height:   Ht Readings from Last 1 Encounters:  04/03/19 5' 10"  (1.778 m)   Weight:   Wt Readings from Last 1 Encounters:  04/05/19 (!) 139.2 kg   Ideal Body Weight:  75.5 kg  BMI:  Body mass index is 44.03 kg/m.  Estimated Nutritional Needs:   Kcal:  9242-6834 (11-14 kcal/kg)  Protein:  189 grams (2.5 grams/kg IBW)  Fluid:  2-2.2 L/day  Willey Blade, MS, RD, LDN Office: [phone] Pager: [phone] After Hours/Weekend Pager: [phone]

## 2019-04-05 NOTE — Progress Notes (Signed)
Pre HD assessment    04/05/19 1350  Neurological  Level of Consciousness Alert  Orientation Level Oriented to person  Respiratory  Respiratory Pattern Regular  Chest Assessment Chest expansion symmetrical  Bilateral Breath Sounds Clear;Diminished  Cough Strong;Non-productive  Cardiac  Pulse Regular  Heart Sounds S1, S2  ECG Monitor Yes  Cardiac Rhythm NSR  Vascular  R Radial Pulse +2  L Radial Pulse +2  Edema Generalized  Generalized Edema +1  Psychosocial  Psychosocial (WDL) WDL  Patient Behaviors Calm;Cooperative  Emotional support given Given to patient

## 2019-04-05 NOTE — Progress Notes (Signed)
Pt extubated without complications no stridor noted, sats on 2lpm [place] 93%.

## 2019-04-05 NOTE — Progress Notes (Signed)
HD Tx started, unable to achieve ordered BFR, MD made aware; currently at 200BFR .    04/05/19 1400  Vital Signs  Temp 99.9 F (37.7 C)  Temp Source Bladder  Pulse Rate (!) 107  Pulse Rate Source Monitor  Resp (!) 28  BP (!) 174/81  BP Location Left Arm  BP Method Automatic  Patient Position (if appropriate) Lying  Oxygen Therapy  SpO2 97 %  O2 Device Nasal Cannula  O2 Flow Rate (L/min) 4 L/min  Pulse Oximetry Type Continuous  During Hemodialysis Assessment  Blood Flow Rate (mL/min) 200 mL/min  Arterial Pressure (mmHg) -70 mmHg  Venous Pressure (mmHg) 170 mmHg  Transmembrane Pressure (mmHg) 50 mmHg  Ultrafiltration Rate (mL/min) 150 mL/min  Dialysate Flow Rate (mL/min) 600 ml/min  Conductivity: Machine  13.6  HD Safety Checks Performed Yes  Dialysis Fluid Bolus Normal Saline  Bolus Amount (mL) 250 mL  Intra-Hemodialysis Comments Tx initiated

## 2019-04-05 NOTE — Progress Notes (Signed)
Follow up - Critical Care Medicine Note  Patient Details:    Roy Oconnell is an 43 y.o. male admitted with acute encephalopathy, questionable to seizure-like activity, DKA requiring insulin gtt and acute hypoxic hypercapnic respiratory failure likely secondary to aspiration pneumonia requiring  Intubation and mechanical ventilation.   Lines, Airways, Drains: Airway 7.5 mm (Active)  Secured at (cm) 24 cm 04/04/19 1607  Measured From Lips 04/04/19 1607  Secured Location Left 04/04/19 1607  Secured By [employer] 04/04/19 1607  Tube Holder Repositioned Yes 04/04/19 1607  Cuff Pressure (cm H2O) 15 cm H2O 04/04/19 1120  Site Condition Dry 04/04/19 1607     CVC Triple Lumen 04/01/19 Right Internal jugular 20 cm (Active)  Indication for Insertion or Continuance of Line Vasoactive infusions;Prolonged intravenous therapies 04/04/19 0800  Site Assessment Clean;Dry;Intact 04/04/19 0800  Proximal Lumen Status Infusing 04/04/19 0800  Medial Lumen Status Infusing 04/04/19 0800  Distal Lumen Status In-line blood sampling system in place;Blood return noted 04/04/19 0800  Dressing Type Transparent;Occlusive 04/04/19 0800  Dressing Status Clean;Dry;Intact;Antimicrobial disc in place 04/04/19 0800  Line Care Connections checked and tightened 04/04/19 0800  Dressing Intervention New dressing;Dressing changed;Antimicrobial disc changed 04/04/19 0000  Dressing Change Due 04/11/19 04/04/19 0800     NG/OG Tube Orogastric Center mouth 58 cm (Active)  Cm Marking at Nare/Corner of Mouth (if applicable) 58 cm 16/10/96 1600  Site Assessment Clean;Intact;Dry 04/04/19 1600  Ongoing Placement Verification No change in cm markings or external length of tube from initial placement;No change in respiratory status;No acute changes, not attributed to clinical condition 04/04/19 1600  Status Infusing tube feed 04/04/19 1600  Amount of suction 80 mmHg 04/02/19 1200  Drainage Appearance None 04/03/19 0400   Intake (mL) 90 mL 04/03/19 2200     Rectal Tube/Pouch (Active)     Urethral Catheter Zach ED tech Non-latex 14 Fr. (Active)  Indication for Insertion or Continuance of Catheter Therapy based on hourly urine output monitoring and documentation for critical condition (NOT STRICT I&O) 04/04/19 0800  Site Assessment Clean;Intact;Dry 04/04/19 0800  Date Prophylactic Dressing Applied (if applicable) 04/54/09 81/19/14 2000  Catheter Maintenance Bag below level of bladder;Catheter secured;Drainage bag/tubing not touching floor;Insertion date on drainage bag;No dependent loops;Seal intact 04/04/19 1600  Collection Container Standard drainage bag 04/04/19 0800  Securement Method Securing device (Describe) 04/04/19 0800  Urinary Catheter Interventions Unclamped 04/03/19 0400  Output (mL) 0 mL 04/04/19 0400    Anti-infectives:  Anti-infectives (From admission, onward)   Start     Dose/Rate Route Frequency Ordered Stop   04/05/19 1045  vancomycin (VANCOCIN) IVPB 1000 mg/200 mL premix     1,000 mg 200 mL/hr over 60 Minutes Intravenous Once in dialysis 04/05/19 1038 04/05/19 1230   04/04/19 1500  vancomycin (VANCOCIN) IVPB 1000 mg/200 mL premix     1,000 mg 200 mL/hr over 60 Minutes Intravenous  Once 04/04/19 1454 04/04/19 1628   04/04/19 1455  vancomycin variable dose per unstable renal function (pharmacist dosing)      Does not apply See admin instructions 04/04/19 1456     04/04/19 1130  vancomycin (VANCOCIN) 2,250 mg in sodium chloride 0.9 % 500 mL IVPB  Status:  Discontinued     2,250 mg 250 mL/hr over 120 Minutes Intravenous  Once 04/04/19 1123 04/04/19 1129   04/02/19 1200  vancomycin (VANCOCIN) 1,250 mg in sodium chloride 0.9 % 250 mL IVPB  Status:  Discontinued     1,250 mg 166.7 mL/hr over 90 Minutes Intravenous Every  24 hours 04/01/19 1358 04/01/19 1438   04/01/19 1600  piperacillin-tazobactam (ZOSYN) IVPB 3.375 g  Status:  Discontinued     3.375 g 12.5 mL/hr over 240 Minutes  Intravenous Every 8 hours 04/01/19 1449 04/04/19 1102   04/01/19 1400  ceFEPIme (MAXIPIME) 2 g in sodium chloride 0.9 % 100 mL IVPB  Status:  Discontinued     2 g 200 mL/hr over 30 Minutes Intravenous Every 12 hours 04/01/19 0941 04/01/19 1438   04/01/19 0945  vancomycin (VANCOCIN) 1,500 mg in sodium chloride 0.9 % 500 mL IVPB     1,500 mg 250 mL/hr over 120 Minutes Intravenous  Once 04/01/19 0939 04/01/19 1500   04/01/19 0645  clindamycin (CLEOCIN) IVPB 600 mg     600 mg 100 mL/hr over 30 Minutes Intravenous  Once 04/01/19 0641 04/01/19 0748   04/01/19 0615  vancomycin (VANCOCIN) IVPB 1000 mg/200 mL premix     1,000 mg 200 mL/hr over 60 Minutes Intravenous  Once 04/01/19 0612 04/01/19 0948   04/01/19 0615  piperacillin-tazobactam (ZOSYN) IVPB 3.375 g     3.375 g 100 mL/hr over 30 Minutes Intravenous  Once 04/01/19 4098 04/01/19 1191      Microbiology: Results for orders placed or performed during the hospital encounter of 04/01/19  Culture, blood (routine x 2)     Status: None (Preliminary result)   Collection Time: 04/01/19  5:30 AM   Specimen: BLOOD  Result Value Ref Range Status   Specimen Description BLOOD BLOOD LEFT HAND  Final   Special Requests   Final    BOTTLES DRAWN AEROBIC AND ANAEROBIC Blood Culture results may not be optimal due to an inadequate volume of blood received in culture bottles   Culture   Final    NO GROWTH 4 DAYS Performed at [facility], [address] [place], [place] [address]    Report Status PENDING  Incomplete  Urine culture     Status: None   Collection Time: 04/01/19  5:30 AM   Specimen: Urine, Random  Result Value Ref Range Status   Specimen Description   Final    URINE, RANDOM Performed at [facility], [address], [place], [place] [address]    Special Requests   Final    NONE Performed at [facility], [address] [place],  [address]    Culture   Final    NO GROWTH Performed at [facility], [place] [address] [place],  [address]    Report Status 04/02/2019 FINAL  Final  SARS Coronavirus 2 (CEPHEID - Performed in [place] hospital lab), Hosp Order     Status: None   Collection Time: 04/01/19  5:30 AM   Specimen: Nasopharyngeal Swab  Result Value Ref Range Status   SARS Coronavirus 2 NEGATIVE NEGATIVE Final    Comment: (NOTE) If result is NEGATIVE SARS-CoV-2 target nucleic acids are NOT DETECTED. The SARS-CoV-2 RNA is generally detectable in upper and lower  respiratory specimens during the acute phase of infection. The lowest  concentration of SARS-CoV-2 viral copies this assay can detect is 250  copies / mL. A negative result does not preclude SARS-CoV-2 infection  and should not be used as the sole basis for treatment or other  patient management decisions.  A negative result may occur with  improper specimen collection / handling, submission of specimen other  than nasopharyngeal swab, presence of viral mutation(s) within the  areas targeted by this assay, and inadequate number  of viral copies  (<250 copies / mL). A negative result must be combined with clinical  observations, patient history, and epidemiological information. If result is POSITIVE SARS-CoV-2 target nucleic acids are DETECTED. The SARS-CoV-2 RNA is generally detectable in upper and lower  respiratory specimens dur ing the acute phase of infection.  Positive  results are indicative of active infection with SARS-CoV-2.  Clinical  correlation with patient history and other diagnostic information is  necessary to determine patient infection status.  Positive results do  not rule out bacterial infection or co-infection with other viruses. If result is PRESUMPTIVE POSTIVE SARS-CoV-2 nucleic acids MAY BE PRESENT.   A presumptive positive result was obtained on the submitted specimen  and confirmed on repeat testing.  While 2019 novel coronavirus  (SARS-CoV-2) nucleic acids may be  present in the submitted sample  additional confirmatory testing may be necessary for epidemiological  and / or clinical management purposes  to differentiate between  SARS-CoV-2 and other Sarbecovirus currently known to infect humans.  If clinically indicated additional testing with an alternate test  methodology [phone]) is advised. The SARS-CoV-2 RNA is generally  detectable in upper and lower respiratory sp ecimens during the acute  phase of infection. The expected result is Negative. Fact Sheet for Patients:  StrictlyIdeas.no Fact Sheet for Healthcare Providers: BankingDealers.co.za This test is not yet approved or cleared by the [place] FDA and has been authorized for detection and/or diagnosis of SARS-CoV-2 by FDA under an Emergency Use Authorization (EUA).  This EUA will remain in effect (meaning this test can be used) for the duration of the COVID-19 declaration under Section 564(b)(1) of the Act, 21 U.S.C. section 360bbb-3(b)(1), unless the authorization is terminated or revoked sooner. Performed at [facility], [address] [place], [place] [address]   Culture, blood (routine x 2)     Status: None (Preliminary result)   Collection Time: 04/01/19  6:49 AM   Specimen: BLOOD  Result Value Ref Range Status   Specimen Description BLOOD BLOOD RIGHT HAND  Final   Special Requests   Final    BOTTLES DRAWN AEROBIC AND ANAEROBIC Blood Culture results may not be optimal due to an excessive volume of blood received in culture bottles   Culture   Final    NO GROWTH 4 DAYS Performed at [facility], [address] [place], [place] [address]    Report Status PENDING  Incomplete  MRSA PCR Screening     Status: None   Collection Time: 04/01/19 10:58 AM   Specimen: Nasal Mucosa; Nasopharyngeal  Result Value Ref Range Status   MRSA by PCR NEGATIVE NEGATIVE Final    Comment:        The GeneXpert MRSA Assay  (FDA approved for NASAL specimens only), is one component of a comprehensive MRSA colonization surveillance program. It is not intended to diagnose MRSA infection nor to guide or monitor treatment for MRSA infections. Performed at [facility], [address] [place], [place] [address]   Culture, respiratory (non-expectorated)     Status: None   Collection Time: 04/01/19 12:50 PM   Specimen: Tracheal Aspirate; Respiratory  Result Value Ref Range Status   Specimen Description   Final    TRACHEAL ASPIRATE Performed at [facility], [address], [place], [place] [address]    Special Requests   Final    NONE Performed at [facility], [address] [place], [place] [address]    Gram Stain  Final    MODERATE WBC PRESENT, PREDOMINANTLY PMN FEW SQUAMOUS EPITHELIAL CELLS PRESENT ABUNDANT GRAM POSITIVE COCCI RARE GRAM POSITIVE RODS Performed at [facility], [place] [address], [place], [place] [address]    Culture FEW METHICILLIN RESISTANT STAPHYLOCOCCUS AUREUS  Final   Report Status 04/04/2019 FINAL  Final   Organism ID, Bacteria METHICILLIN RESISTANT STAPHYLOCOCCUS AUREUS  Final      Susceptibility   Methicillin resistant staphylococcus aureus - MIC*    CIPROFLOXACIN <=0.5 SENSITIVE Sensitive     ERYTHROMYCIN <=0.25 SENSITIVE Sensitive     GENTAMICIN <=0.5 SENSITIVE Sensitive     OXACILLIN >=4 RESISTANT Resistant     TETRACYCLINE <=1 SENSITIVE Sensitive     VANCOMYCIN <=0.5 SENSITIVE Sensitive     TRIMETH/SULFA <=10 SENSITIVE Sensitive     CLINDAMYCIN <=0.25 SENSITIVE Sensitive     RIFAMPIN <=0.5 SENSITIVE Sensitive     Inducible Clindamycin NEGATIVE Sensitive     * FEW METHICILLIN RESISTANT STAPHYLOCOCCUS AUREUS  C difficile quick scan w PCR reflex     Status: None   Collection Time: 04/04/19  9:07 PM   Specimen: STOOL  Result Value Ref Range Status   C Diff antigen NEGATIVE NEGATIVE Final   C Diff toxin NEGATIVE NEGATIVE Final   C  Diff interpretation No C. difficile detected.  Final    Comment: Performed at [facility], [address] [place], [place] [address]    Best Practice/Protocols:  VTE Prophylaxis: Heparin (CRRT) GI Prophylaxis: Proton Pump Inhibitor Continous Sedation Hyperglycemia (ICU)  Events: 6/12 admitted for seizures, DKA, aspiration pnwumonia RT IJ CVL PLACED IN ER 6/13 severe hypoxia, acidosis LEFT IJ VASC PLACED for CRRT 6/14 remains critically ill, on vent, vasopressors 6/16 tolerating weaning  Studies: Dg Chest 1 View  Result Date: 04/01/2019 CLINICAL DATA:  New right-sided central line placement. EXAM: CHEST  1 VIEW COMPARISON:  None. FINDINGS: The heart size is exaggerated by low lung volumes. Endotracheal tube terminates 4 cm above the carina. Right IJ line is new. There is no pneumothorax. Lung volumes are low. Left greater than right basilar airspace opacities are present. Moderate pulmonary vascular congestion is again noted. IMPRESSION: 1. New right IJ line without radiographic evidence for complication. 2. Low lung volumes and moderate pulmonary vascular congestion. 3. Left greater than right basilar opacities again noted. Electronically Signed   By: San Morelle M.D.   On: 04/01/2019 08:53   Dg Chest 1 View  Result Date: 04/01/2019 CLINICAL DATA:  Unresponsive. EXAM: CHEST  1 VIEW COMPARISON:  None. FINDINGS: The patient is intubated. Endotracheal tube terminates 3.4 cm above the carina. Side port of the NG tube is beyond the GE junction. The heart is enlarged. This is exaggerated by low lung volumes. Perihilar prominence is noted bilaterally. Minimal basilar airspace opacities likely reflect atelectasis. IMPRESSION: 1. Low lung volumes. 2. Cardiomegaly without failure. 3. Moderate perihilar prominence. This may be due to pulmonary vascular congestion and low lung volumes. Adenopathy or mass lesion is not excluded. 4. Bibasilar airspace opacities likely reflect  atelectasis. Electronically Signed   By: San Morelle M.D.   On: 04/01/2019 06:05   Ct Head Wo Contrast  Result Date: 04/01/2019 CLINICAL DATA:  Unresponsive.  Possible seizures. EXAM: CT HEAD WITHOUT CONTRAST TECHNIQUE: Contiguous axial images were obtained from the base of the skull through the vertex without intravenous contrast. COMPARISON:  None. FINDINGS: Brain: No acute infarct, hemorrhage, or mass lesion is present. No significant white matter lesions are present. The ventricles  are of normal size. No significant extraaxial fluid collection is present. The brainstem and cerebellum are within normal limits. Vascular: No hyperdense vessel or unexpected calcification. Skull: Calvarium is intact. No focal lytic or blastic lesions are present. Remote right-sided nasal bone fracture is noted. Sinuses/Orbits: The paranasal sinuses and mastoid air cells are clear. The globes and orbits are within normal limits. IMPRESSION: 1. Normal CT appearance the brain. Electronically Signed   By: San Morelle M.D.   On: 04/01/2019 06:18   US Renal  Result Date: 04/03/2019 CLINICAL DATA:  Acute renal failure, history diabetes mellitus, hypertension EXAM: RENAL / URINARY TRACT ULTRASOUND COMPLETE COMPARISON:  None FINDINGS: Right Kidney: Renal measurements: 13.6 x 7.2 x 6.4 cm = volume: 328 mL. Normal cortical thickness and echogenicity. No mass, hydronephrosis or shadowing calcification. Left Kidney: Not identified in the LEFT renal fossa. No LEFT renal tissue identified in the pelvis. Bladder: Decompressed, contains minimal urine, poorly assessed. Incidentally noted echogenic hepatic parenchyma, likely fatty infiltration though this can be seen with cirrhosis and certain infiltrative disorders. Visualized hepatic margins appear smooth. IMPRESSION: Normal appearing RIGHT kidney. Probable fatty infiltration of liver as above. Nonvisualization of LEFT kidney; this could be due to congenital or surgical  absence, isoechogenicity with retroperitoneal fat making it inapparent, or ectopic in position. Recommend correlation with patient history. If localization of the LEFT kidney is desired, consider CT. Electronically Signed   By: Lavonia Dana M.D.   On: 04/03/2019 16:17   Dg Chest Port 1 View  Result Date: 04/03/2019 CLINICAL DATA:  Acute respiratory failure. EXAM: PORTABLE CHEST 1 VIEW COMPARISON:  04/02/2019 FINDINGS: Endotracheal tube terminates 4.5 cm above the carina. Bilateral jugular catheters terminate over the SVC, unchanged. Enteric tube courses into the abdomen with tip not imaged. The cardiomediastinal silhouette is unchanged. Retrocardiac opacity in the left lung base has decreased. No overt pulmonary edema, large pleural effusion, or pneumothorax is identified. IMPRESSION: Improved left basilar aeration. Electronically Signed   By: Logan Bores M.D.   On: 04/03/2019 07:30   Dg Chest Port 1 View  Result Date: 04/02/2019 CLINICAL DATA:  Central line placement. Acute respiratory failure with hypoxia. Sepsis. On ventilator. EXAM: PORTABLE CHEST 1 VIEW COMPARISON:  04/01/2019 FINDINGS: A new left jugular dual-lumen central venous catheter is seen with tip overlying the distal SVC. Endotracheal tube, right jugular catheter and nasogastric tube remain in appropriate position. No pneumothorax visualized. Heart size is stable. Persistent opacity in the left retrocardiac lung base results in silhouetting of the left hemidiaphragm, and is unchanged. Right lung remains clear. IMPRESSION: 1. New left jugular dual-lumen central venous catheter in appropriate position. No evidence of pneumothorax. 2. Stable left retrocardiac atelectasis versus infiltrate. Electronically Signed   By: Earle Gell M.D.   On: 04/02/2019 11:50   Dg Abd Portable 1v  Result Date: 04/01/2019 CLINICAL DATA:  Evaluate NG tube EXAM: PORTABLE ABDOMEN - 1 VIEW COMPARISON:  None. FINDINGS: The NG tube terminates in the stomach. No other  acute abnormalities. IMPRESSION: The NG tube terminates in the stomach. Electronically Signed   By: Dorise Bullion III M.D   On: 04/01/2019 17:15    Consults: Treatment Team:  Pccm, Ander Gaster, MD Flora Lipps, MD Leotis Pain, MD   Subjective:    Overnight Issues: has not required paralytics.  Tolerating transition to Precedex.  Following commands.  Tolerating SBT.  Objective:  Vital signs for last 24 hours: Temp:  [99.1 F (37.3 C)-100.4 F (38 C)] 100.4 F (38 C) (06/16 1700)  Pulse Rate:  [25-110] 109 (06/16 1700) Resp:  [22-34] 32 (06/16 1700) BP: (108-192)/(70-91) 178/81 (06/16 1700) SpO2:  [94 %-100 %] 99 % (06/16 1700) FiO2 (%):  [30 %] 30 % (06/16 0907) Weight:  [139.2 kg] 139.2 kg (06/16 1354)  Hemodynamic parameters for last 24 hours:    Intake/Output from previous day: 06/15 0701 - 06/16 0700 In: 2494.4 [I.V.:1309.3; NG/GT:720; IV Piggyback:465.1] Out: 125 [Stool:125]  Intake/Output this shift: Total I/O In: 61.3 [I.V.:61.3] Out: -   Vent settings for last 24 hours: Vent Mode: Spontaneous FiO2 (%):  [30 %] 30 % Set Rate:  [25 bmp] 25 bmp Vt Set:  [500 mL] 500 mL PEEP:  [5 cmH20-8 cmH20] 5 cmH20 Pressure Support:  [5 cmH20] 5 cmH20  Physical Exam:  General:  Well developed, obese male, intubated, awake, follows commands, synchronous with the ventilator Neuro:  Calm, moves all 4 spontaneously, follows commands HEENT:  PERRL, no scleral icterus  Neck: Supple, no JVD, trachea midline Cardiovascular: RRR, no RM or G  Lungs:  Coarse breath sounds i throughout, synchronous with vent Abdomen: +BS x4,soft Musculoskeletal: normal bulk and tone, no edema  Skin: intact no rashes or lesions present   Assessment/Plan:  1.  Acute hypoxic respiratory failure: Likely due to aspiration pneumonia in the setting of metabolic derangements due to severe DKA.  Patient has corrected his metabolic derangements.  His encephalopathy and delirium have cleared.  Volume  overload controlled with dialysis.  Ventilator management: Tolerating SBT, extubate today.   2.  Aspiration pneumonia: Continue vancomycin for MRSA.  3.  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  Discussed with Dr. Juleen China.  4.  Encephalopathy, acute metabolic: Has responded well to Precedex.  Cephalopathy resolved.  5.  Diabetic ketoacidosis: Ketoacidosis resolved.  Continues with hyperglycemia, hyperglycemia protocol.  Better control today.  6.  Seizures: No recurrence.  Continue Keppra.  7.  Obesity: This issue adds complexity to his management.      LOS: 4 days   Additional comments: Discussed at multidisciplinary rounds  Critical Care Total Time*: 35  minutes  C. Derrill Kay, MD [facility] [facility] 04/05/2019  *Care during the described time interval was provided by me and/or other providers on the critical care team.  I have reviewed this patient's available data, including medical history, events of note, physical examination and test results as part of my evaluation.

## 2019-04-05 NOTE — Progress Notes (Signed)
Post HD Assessment    04/05/19 1741  Neurological  Level of Consciousness Alert  Orientation Level Oriented X4  Respiratory  Respiratory Pattern Regular  Chest Assessment Chest expansion symmetrical  Bilateral Breath Sounds Clear;Diminished  Cough Strong  Cardiac  Pulse Regular  Heart Sounds S1, S2  ECG Monitor Yes  Cardiac Rhythm ST  Vascular  R Radial Pulse +2  L Radial Pulse +2  Edema Generalized  Generalized Edema +1  Psychosocial  Psychosocial (WDL) WDL  Patient Behaviors Cooperative;Calm  Emotional support given Given to patient

## 2019-04-05 NOTE — Progress Notes (Signed)
Pre HD [facility]   04/05/19 1354  Hand-Off documentation  Report given to (Full Name) Beatris Ship, RN   Report received from (Full Name) Talbot Grumbling, RN   Vital Signs  Temp 99.9 F (37.7 C)  Temp Source Bladder  Pulse Rate (!) 106  Pulse Rate Source Monitor  Resp (!) 26  BP (!) 171/86  BP Location Left Arm  BP Method Automatic  Patient Position (if appropriate) Lying  Oxygen Therapy  SpO2 97 %  O2 Device Nasal Cannula  O2 Flow Rate (L/min) 4 L/min  Pulse Oximetry Type Continuous  Pain Assessment  Pain Scale 0-10  Pain Score 0  Dialysis Weight  Weight (!) 139.2 kg  Type of Weight Pre-Dialysis  Time-Out for Hemodialysis  What Procedure? HD  Pt Identifiers(min of two) First/Last Name;MRN/Account#  Correct Site? Yes  Correct Side? Yes  Correct Procedure? Yes  Consents Verified? Yes  Rad Studies Available? N/A  Safety Precautions Reviewed? Yes  Engineer, civil (consulting) Number 3  Station Number  (Bedside ICU 11 )  UF/Alarm Test Passed  Conductivity: Meter 14  Conductivity: Machine  14.1  pH 7.2  Reverse Osmosis Portable 4  Normal Saline Lot Number [id number]  Dialyzer Lot Number [id number]  Disposable Set Lot Number [id number]  Machine Temperature 98.6 F (37 C)  Musician and Audible Yes  Blood Lines Intact and Secured Yes  Pre Treatment Patient Checks  Vascular access used during treatment Catheter  HD catheter dressing before treatment WDL  Hepatitis B Surface Antigen Results Pending  Isolation Initiated Yes  Hepatitis B Surface Antibody  (unknown )  Date Hepatitis B Surface Antibody Drawn 04/05/19  Hemodialysis Consent Verified Yes  Hemodialysis Standing Orders Initiated Yes  ECG (Telemetry) Monitor On Yes  Prime Ordered Normal Saline  Length of  DialysisTreatment -hour(s) 3 Hour(s)  Dialysis Treatment Comments Na 140  Dialyzer Elisio 17H NR  Dialysate 3K;2.5 Ca  Dialysis Anticoagulant None  Dialysate Flow Ordered 600  Blood Flow Rate Ordered 400 mL/min   Dialysis Blood Pressure Support Ordered Normal Saline  Education / Care Plan  Dialysis Education Provided Yes  Documented Education in Care Plan Yes  Hemodialysis Catheter Left Internal jugular Triple lumen Temporary (Non-Tunneled)  Placement Date/Time: 04/02/19 1100   Placed prior to admission: No  Maximum sterile barrier precautions: Hand hygiene;Cap;Mask;Sterile gown;Sterile gloves;Large sterile sheet  Site Prep: Chlorhexidine (preferred);Skin Prep Completely Dry at the Time o...  Site Condition No complications  Blue Lumen Status Blood return noted  Red Lumen Status Blood return noted  Purple Lumen Status Capped (Central line)  Dressing Type Biopatch;Occlusive  Dressing Status Clean;Dry;Intact  Drainage Description None

## 2019-04-06 ENCOUNTER — Other Ambulatory Visit (INDEPENDENT_AMBULATORY_CARE_PROVIDER_SITE_OTHER): Payer: Self-pay | Admitting: Vascular Surgery

## 2019-04-06 ENCOUNTER — Other Ambulatory Visit: Payer: Self-pay

## 2019-04-06 DIAGNOSIS — N179 Acute kidney failure, unspecified: Secondary | ICD-10-CM

## 2019-04-06 DIAGNOSIS — E785 Hyperlipidemia, unspecified: Secondary | ICD-10-CM

## 2019-04-06 DIAGNOSIS — E1111 Type 2 diabetes mellitus with ketoacidosis with coma: Secondary | ICD-10-CM

## 2019-04-06 DIAGNOSIS — Z7982 Long term (current) use of aspirin: Secondary | ICD-10-CM

## 2019-04-06 DIAGNOSIS — Z79899 Other long term (current) drug therapy: Secondary | ICD-10-CM

## 2019-04-06 LAB — CULTURE, BLOOD (ROUTINE X 2)
Culture: NO GROWTH
Culture: NO GROWTH

## 2019-04-06 LAB — BASIC METABOLIC PANEL
Anion gap: 18 — ABNORMAL HIGH (ref 5–15)
BUN: 66 mg/dL — ABNORMAL HIGH (ref 6–20)
CO2: 19 mmol/L — ABNORMAL LOW (ref 22–32)
Calcium: 8.3 mg/dL — ABNORMAL LOW (ref 8.9–10.3)
Chloride: 101 mmol/L (ref 98–111)
Creatinine, Ser: 8.76 mg/dL — ABNORMAL HIGH (ref 0.61–1.24)
GFR calc Af Amer: 8 mL/min — ABNORMAL LOW (ref >60)
GFR calc non Af Amer: 7 mL/min — ABNORMAL LOW (ref >60)
Glucose, Bld: 255 mg/dL — ABNORMAL HIGH (ref 70–99)
Potassium: 3.6 mmol/L (ref 3.5–5.1)
Sodium: 138 mmol/L (ref 135–145)

## 2019-04-06 LAB — HEPATITIS B SURFACE ANTIGEN: Hepatitis B Surface Ag: NEGATIVE

## 2019-04-06 LAB — HEPATITIS C ANTIBODY: HCV Ab: <0.1 s/co ratio (ref 0.0–0.9)

## 2019-04-06 LAB — GLUCOSE, CAPILLARY
Glucose-Capillary: 211 mg/dL — ABNORMAL HIGH (ref 70–99)
Glucose-Capillary: 238 mg/dL — ABNORMAL HIGH (ref 70–99)
Glucose-Capillary: 261 mg/dL — ABNORMAL HIGH (ref 70–99)
Glucose-Capillary: 242 mg/dL — ABNORMAL HIGH (ref 70–99)
Glucose-Capillary: 231 mg/dL — ABNORMAL HIGH (ref 70–99)
Glucose-Capillary: 265 mg/dL — ABNORMAL HIGH (ref 70–99)

## 2019-04-06 LAB — HEPATITIS B SURFACE ANTIBODY,QUALITATIVE: Hep B S Ab: NONREACTIVE

## 2019-04-06 LAB — HEPATITIS B CORE ANTIBODY, IGM: Hep B C IgM: NEGATIVE

## 2019-04-06 MED ORDER — METOPROLOL SUCCINATE ER 50 MG PO TB24
50.0000 mg | ORAL_TABLET | Freq: Every day | ORAL | Status: DC
  Administered 2019-04-06: 02:00:00 50 mg via ORAL
  Filled 2019-04-06 (×2): qty 1

## 2019-04-06 MED ORDER — INSULIN STARTER KIT- SYRINGES (ENGLISH)
1.0000 | Freq: Once | Status: DC
  Filled 2019-04-06: qty 1

## 2019-04-06 MED ORDER — NEPRO/CARBSTEADY PO LIQD
237.0000 mL | Freq: Two times a day (BID) | ORAL | Status: DC
  Administered 2019-04-08 – 2019-04-15 (×4): 237 mL via ORAL

## 2019-04-06 MED ORDER — RENA-VITE PO TABS
1.0000 | ORAL_TABLET | Freq: Every day | ORAL | Status: DC
  Administered 2019-04-06 – 2019-04-14 (×9): 1 via ORAL
  Filled 2019-04-06 (×9): qty 1

## 2019-04-06 MED ORDER — LIVING WELL WITH DIABETES BOOK
Freq: Once | Status: AC
  Administered 2019-04-07: 05:00:00
  Filled 2019-04-06: qty 1

## 2019-04-06 MED ORDER — AMLODIPINE BESYLATE 10 MG PO TABS
10.0000 mg | ORAL_TABLET | Freq: Every day | ORAL | Status: DC
  Administered 2019-04-06 – 2019-04-15 (×10): 10 mg via ORAL
  Filled 2019-04-06 (×11): qty 1

## 2019-04-06 NOTE — Progress Notes (Signed)
Inpatient Diabetes Program Recommendations  AACE/ADA: New Consensus Statement on Inpatient Glycemic Control (2015)  Target Ranges:  Prepandial:   less than 140 mg/dL      Peak postprandial:   less than 180 mg/dL (1-2 hours)      Critically ill patients:  140 - 180 mg/dL   Lab Results  Component Value Date   GLUCAP 261 (H) 04/06/2019   HGBA1C 14.8 (H) 04/01/2019    Review of Glycemic Control Results for GERRY, BLANCHFIELD (MRN [id number]) as of 04/06/2019 15:07  Ref. Range 04/06/2019 07:28 04/06/2019 11:43  Glucose-Capillary Latest Ref Range: 70 - 99 mg/dL 238 (H) 261 (H)   Diabetes history: DM 2 Outpatient Diabetes medications:  Glucotrol 10 mg bid Current orders for Inpatient glycemic control:  Novolog moderate q 4 hours Lantus 25 units bid  Inpatient Diabetes Program Recommendations:    Note that patient has started Dys. 3 diet- He is scheduled for surgery tomorrow for Permcath.   Will need teaching on insulin administration/ DM management when appropriate. Will ask bedside RN's to begin insulin teaching with each dose of insulin during hospitalization.  Will order insulin starter kit and Living well with DM booklet as well.  Patient was sleeping this afternoon.  Will follow up on 6/18 regarding questions/DM management.  Also will place care management consult regarding medications/PCP f/u after d/c as he has no active insurance listed.   Consider increasing Lantus to 30 units bid.    Thanks,  Adah Perl, RN, BC-ADM Inpatient Diabetes Coordinator Pager [phone]  (8a-5p)

## 2019-04-06 NOTE — Consult Note (Signed)
[facility]ascular Consult Note  MRN : [id number]  Roy Oconnell is a 43 y.o. (1976-06-24) male who presents with chief complaint of  Chief Complaint  Patient presents with  . Seizures  . Loss of Consciousness   History of Present Illness:  The patient is a 42 year old male with a past medical history of hypertension, hyperlipidemia, diabetes mellitus who presented to the [facility] emergency department unresponsive on April 01, 2019.    He was given Narcan however without any results.  The patient's roommate believes that he had a seizure.  Urine toxicology screen was negative.  The patient was intubated on arrival to the emergency department.  Labs showed acute kidney injury, DKA, lactic acid greater than 11 and WBC count of 25,000.   Baseline creatinine 1.5 (08/27/2018) with history of renal donation.  Patient appears to have severe acute renal failure now which is most likely due to hypotension.  A temporary dialysis catheter was placed and patient was started on hemodialysis.  The patient has not shown any improvement in his renal function and therefore she will have to continue hemodialysis as an outpatient.  Vascular surgery was consulted by Dr. Juleen China for a PermCath placement Current Facility-Administered Medications  Medication Dose Route Frequency Provider Last Rate Last Dose  . acetaminophen (TYLENOL) tablet 650 mg  650 mg Oral Q6H PRN Hillary Bow, MD   650 mg at 04/05/19 2318   Or  . acetaminophen (TYLENOL) suppository 650 mg  650 mg Rectal Q6H PRN Sudini, Alveta Heimlich, MD      . albuterol (PROVENTIL) (2.5 MG/3ML) 0.083% nebulizer solution 2.5 mg  2.5 mg Nebulization Q2H PRN Sudini, Srikar, MD      . amLODipine (NORVASC) tablet 10 mg  10 mg Oral Daily Darel Hong D, NP   10 mg at 04/06/19 0145  . chlorhexidine gluconate (MEDLINE KIT) (PERIDEX) 0.12 % solution 15 mL  15 mL Mouth Rinse BID Tukov-Yual, Magdalene S, NP   15 mL  at 04/05/19 0811  . Chlorhexidine Gluconate Cloth 2 % PADS 6 each  6 each Topical Daily Tukov-Yual, Magdalene S, NP   6 each at 04/05/19 1021  . dexmedetomidine (PRECEDEX) 400 MCG/100ML (4 mcg/mL) infusion  0.4-1.2 mcg/kg/hr Intravenous Titrated Flora Lipps, MD   Stopped at 04/05/19 1046  . epoetin alfa (EPOGEN) injection 10,000 Units  10,000 Units Intravenous Q T,Th,Sa-HD Lavonia Dana, MD   10,000 Units at 04/05/19 1656  . [START ON 04/07/2019] feeding supplement (NEPRO CARB STEADY) liquid 237 mL  237 mL Oral BID BM Sudini, Srikar, MD      . heparin injection 1,000-6,000 Units  1,000-6,000 Units CRRT PRN Anthonette Legato, MD   2,800 Units at 04/04/19 0645  . hydrALAZINE (APRESOLINE) injection 10-20 mg  10-20 mg Intravenous Q6H PRN Darel Hong D, NP   20 mg at 04/06/19 1321  . insulin aspart (novoLOG) injection 0-15 Units  0-15 Units Subcutaneous Q4H Flora Lipps, MD   8 Units at 04/06/19 1314  . insulin glargine (LANTUS) injection 25 Units  25 Units Subcutaneous BID Awilda Bill, NP   25 Units at 04/06/19 1315  . labetalol (NORMODYNE) injection 10 mg  10 mg Intravenous Q4H PRN Bradly Bienenstock, NP   10 mg at 04/05/19 2150  . levETIRAcetam (KEPPRA) IVPB 1000 mg/100 mL premix  1,000 mg Intravenous q1800 Tyler Pita, MD   Stopped at 04/05/19 1805  . metoprolol succinate (TOPROL-XL) 24 hr tablet 50 mg  50 mg  Oral Daily Darel Hong D, NP   50 mg at 04/06/19 0145  . multivitamin (RENA-VIT) tablet 1 tablet  1 tablet Oral QHS Sudini, Srikar, MD      . ondansetron (ZOFRAN) tablet 4 mg  4 mg Oral Q6H PRN Hillary Bow, MD       Or  . ondansetron (ZOFRAN) injection 4 mg  4 mg Intravenous Q6H PRN Sudini, Srikar, MD      . pantoprazole sodium (PROTONIX) 40 mg/20 mL oral suspension 40 mg  40 mg Per Tube Daily Charlett Nose, RPH   40 mg at 04/06/19 0823  . polyethylene glycol (MIRALAX / GLYCOLAX) packet 17 g  17 g Oral Daily PRN Sudini, Srikar, MD      . sodium chloride flush (NS) 0.9  % injection 10-40 mL  10-40 mL Intracatheter Q12H Tukov-Yual, Magdalene S, NP   10 mL at 04/05/19 2200  . sodium chloride flush (NS) 0.9 % injection 10-40 mL  10-40 mL Intracatheter PRN Tukov-Yual, Magdalene S, NP      . sodium chloride flush (NS) 0.9 % injection 3 mL  3 mL Intravenous Q12H Sudini, Srikar, MD   3 mL at 04/05/19 2200  . vancomycin variable dose per unstable renal function (pharmacist dosing)   Does not apply See admin instructions Charlett Nose [facility]       Past Medical History:  Diagnosis Date  . Diabetes mellitus without complication ([place])   . Hyperlipemia   . Hypertension    History reviewed. No pertinent surgical history.  Social History Social History   Tobacco Use  . Smoking status: Unknown If Ever Smoked  Substance Use Topics  . Alcohol use: Not on file    Comment: unable to review  . Drug use: Not on file    Comment: unable to review   Family History No family history on file.  Patient denies any family history of peripheral artery disease, venous disease or renal disease.  REVIEW OF SYSTEMS (Negative unless checked)  Constitutional: _0 Weight loss  _1 Fever  _2 Chills Cardiac: _3 Chest pain   _4 Chest pressure   _5 Palpitations   _6 Shortness of breath when laying flat   _7 Shortness of breath at rest   _8 Shortness of breath with exertion. Vascular:  _9 Pain in legs with walking   _10 Pain in legs at rest   _11 Pain in legs when laying flat   _12 Claudication   _13 Pain in feet when walking  _14 Pain in feet at rest  _15 Pain in feet when laying flat   _16 History of DVT   _17 Phlebitis   _18 Swelling in legs   _19 Varicose veins   _20 Non-healing ulcers Pulmonary:   _21 Uses home oxygen   _22 Productive cough   _23 Hemoptysis   _24 Wheeze  _25 COPD   _26 Asthma Neurologic:  _27 Dizziness  _28 Blackouts   _29 Seizures   _30 History of stroke   _31 History of TIA  _32 Aphasia   _33 Temporary blindness   _34 Dysphagia   _35 Weakness or numbness in arms   _36 Weakness or numbness in legs Musculoskeletal:  _37 Arthritis    _38 Joint swelling   _39 Joint pain   _40 Low back pain Hematologic:  _41 Easy bruising  _42 Easy bleeding   _43 Hypercoagulable state   _44 Anemic  _45 Hepatitis Gastrointestinal:  _46 Blood in stool   _47 Vomiting blood  _48 Gastroesophageal reflux/heartburn   _49 Difficulty swallowing. Genitourinary:  _50 Chronic kidney disease   _51 Difficult urination  _52 Frequent urination  _53 Burning with urination   _54 Blood in urine Skin:  _55 Rashes   _56 Ulcers   _57 Wounds Psychological:  _58 History of anxiety   _59  History  of major depression.  Physical Examination  Vitals:   04/06/19 0800 04/06/19 0900 04/06/19 1200 04/06/19 1300  BP: (!) 170/88 (!) 184/85 (!) 174/77 (!) 200/95  Pulse: (!) 107 (!) 108  (!) 102  Resp: (!) 21 (!) 29 14 (!) 34  Temp: 99 F (37.2 C) 99 F (37.2 C) 99.1 F (37.3 C) 99 F (37.2 C)  TempSrc:      SpO2: 95% 96%  97%  Weight:      Height:       Body mass index is 44.06 kg/m. Gen:  WD/WN, NAD Head: [place]/AT, No temporalis wasting. Prominent temp pulse not noted. Ear/Nose/Throat: Hearing grossly intact, nares w/o erythema or drainage, oropharynx w/o Erythema/Exudate Eyes: Sclera non-icteric, conjunctiva clear Neck: Trachea midline.  No JVD.  Pulmonary:  Good air movement, respirations not labored, equal bilaterally.  Cardiac: RRR, normal S1, S2. Vascular:  Vessel Right Left  Radial Palpable Palpable  Ulnar Palpable Palpable  Brachial Palpable Palpable  Carotid Palpable, without bruit Palpable, without bruit  Aorta Not palpable N/A  Femoral Palpable Palpable  Popliteal Palpable Palpable  PT Palpable Palpable  DP Palpable Palpable   Gastrointestinal: soft, non-tender/non-distended. No guarding/reflex.  Musculoskeletal: M/S 5/5 throughout.  Extremities without ischemic changes.  No deformity or atrophy. No edema. Neurologic: Sensation grossly intact in extremities.  Symmetrical.  Speech is fluent. Motor exam as listed above. Psychiatric: Judgment intact, Mood & affect appropriate for pt's  clinical situation. Dermatologic: No rashes or ulcers noted.  No cellulitis or open wounds. Lymph : No Cervical, Axillary, or Inguinal lymphadenopathy.  CBC Lab Results  Component Value Date   WBC 15.4 (H) 04/05/2019   HGB 9.7 (L) 04/05/2019   HCT 29.7 (L) 04/05/2019   MCV 92.0 04/05/2019   PLT 207 04/05/2019   BMET    Component Value Date/Time   NA 138 04/06/2019 0517   K 3.6 04/06/2019 0517   CL 101 04/06/2019 0517   CO2 19 (L) 04/06/2019 0517   GLUCOSE 255 (H) 04/06/2019 0517   BUN 66 (H) 04/06/2019 0517   CREATININE 8.76 (H) 04/06/2019 0517   CALCIUM 8.3 (L) 04/06/2019 0517   GFRNONAA 7 (L) 04/06/2019 0517   GFRAA 8 (L) 04/06/2019 0517   Estimated Creatinine Clearance: 15.6 mL/min (A) (by C-G formula based on SCr of 8.76 mg/dL (H)).  COAG No results found for: INR, PROTIME  Radiology Dg Chest 1 View  Result Date: 04/01/2019 CLINICAL DATA:  New right-sided central line placement. EXAM: CHEST  1 VIEW COMPARISON:  None. FINDINGS: The heart size is exaggerated by low lung volumes. Endotracheal tube terminates 4 cm above the carina. Right IJ line is new. There is no pneumothorax. Lung volumes are low. Left greater than right basilar airspace opacities are present. Moderate pulmonary vascular congestion is again noted. IMPRESSION: 1. New right IJ line without radiographic evidence for complication. 2. Low lung volumes and moderate pulmonary vascular congestion. 3. Left greater than right basilar opacities again noted. Electronically Signed   By: San Morelle M.D.   On: 04/01/2019 08:53   Dg Chest 1 View  Result Date: 04/01/2019 CLINICAL DATA:  Unresponsive. EXAM: CHEST  1 VIEW COMPARISON:  None. FINDINGS: The patient is intubated. Endotracheal tube terminates 3.4 cm above the carina. Side port of the NG tube is beyond the GE junction. The heart is enlarged. This is exaggerated by low lung volumes. Perihilar prominence is noted bilaterally. Minimal basilar airspace  opacities likely reflect atelectasis. IMPRESSION: 1. Low lung volumes. 2. Cardiomegaly  without failure. 3. Moderate perihilar prominence. This may be due to pulmonary vascular congestion and low lung volumes. Adenopathy or mass lesion is not excluded. 4. Bibasilar airspace opacities likely reflect atelectasis. Electronically Signed   By: San Morelle M.D.   On: 04/01/2019 06:05   Ct Head Wo Contrast  Result Date: 04/01/2019 CLINICAL DATA:  Unresponsive.  Possible seizures. EXAM: CT HEAD WITHOUT CONTRAST TECHNIQUE: Contiguous axial images were obtained from the base of the skull through the vertex without intravenous contrast. COMPARISON:  None. FINDINGS: Brain: No acute infarct, hemorrhage, or mass lesion is present. No significant white matter lesions are present. The ventricles are of normal size. No significant extraaxial fluid collection is present. The brainstem and cerebellum are within normal limits. Vascular: No hyperdense vessel or unexpected calcification. Skull: Calvarium is intact. No focal lytic or blastic lesions are present. Remote right-sided nasal bone fracture is noted. Sinuses/Orbits: The paranasal sinuses and mastoid air cells are clear. The globes and orbits are within normal limits. IMPRESSION: 1. Normal CT appearance the brain. Electronically Signed   By: San Morelle M.D.   On: 04/01/2019 06:18   US Renal  Result Date: 04/03/2019 CLINICAL DATA:  Acute renal failure, history diabetes mellitus, hypertension EXAM: RENAL / URINARY TRACT ULTRASOUND COMPLETE COMPARISON:  None FINDINGS: Right Kidney: Renal measurements: 13.6 x 7.2 x 6.4 cm = volume: 328 mL. Normal cortical thickness and echogenicity. No mass, hydronephrosis or shadowing calcification. Left Kidney: Not identified in the LEFT renal fossa. No LEFT renal tissue identified in the pelvis. Bladder: Decompressed, contains minimal urine, poorly assessed. Incidentally noted echogenic hepatic parenchyma, likely fatty  infiltration though this can be seen with cirrhosis and certain infiltrative disorders. Visualized hepatic margins appear smooth. IMPRESSION: Normal appearing RIGHT kidney. Probable fatty infiltration of liver as above. Nonvisualization of LEFT kidney; this could be due to congenital or surgical absence, isoechogenicity with retroperitoneal fat making it inapparent, or ectopic in position. Recommend correlation with patient history. If localization of the LEFT kidney is desired, consider CT. Electronically Signed   By: Lavonia Dana M.D.   On: 04/03/2019 16:17   Dg Chest Port 1 View  Result Date: 04/03/2019 CLINICAL DATA:  Acute respiratory failure. EXAM: PORTABLE CHEST 1 VIEW COMPARISON:  04/02/2019 FINDINGS: Endotracheal tube terminates 4.5 cm above the carina. Bilateral jugular catheters terminate over the SVC, unchanged. Enteric tube courses into the abdomen with tip not imaged. The cardiomediastinal silhouette is unchanged. Retrocardiac opacity in the left lung base has decreased. No overt pulmonary edema, large pleural effusion, or pneumothorax is identified. IMPRESSION: Improved left basilar aeration. Electronically Signed   By: Logan Bores M.D.   On: 04/03/2019 07:30   Dg Chest Port 1 View  Result Date: 04/02/2019 CLINICAL DATA:  Central line placement. Acute respiratory failure with hypoxia. Sepsis. On ventilator. EXAM: PORTABLE CHEST 1 VIEW COMPARISON:  04/01/2019 FINDINGS: A new left jugular dual-lumen central venous catheter is seen with tip overlying the distal SVC. Endotracheal tube, right jugular catheter and nasogastric tube remain in appropriate position. No pneumothorax visualized. Heart size is stable. Persistent opacity in the left retrocardiac lung base results in silhouetting of the left hemidiaphragm, and is unchanged. Right lung remains clear. IMPRESSION: 1. New left jugular dual-lumen central venous catheter in appropriate position. No evidence of pneumothorax. 2. Stable left  retrocardiac atelectasis versus infiltrate. Electronically Signed   By: Earle Gell M.D.   On: 04/02/2019 11:50   Dg Abd Portable 1v  Result Date: 04/01/2019 CLINICAL DATA:  Evaluate  NG tube EXAM: PORTABLE ABDOMEN - 1 VIEW COMPARISON:  None. FINDINGS: The NG tube terminates in the stomach. No other acute abnormalities. IMPRESSION: The NG tube terminates in the stomach. Electronically Signed   By: Dorise Bullion III M.D   On: 04/01/2019 17:15   Assessment/Plan The patient is a 43 year old male with a past medical history of hypertension, hyperlipidemia, diabetes mellitus who presented to the [facility] emergency department unresponsive on April 01, 2019 found to be in acute renal failure, DKA, possible seizure and sepsis 1.  Acute renal failure: The patient had a temporary dialysis catheter placed and has been receiving hemodialysis.  The patient has not shown any significant improvement in his renal function and will need to continue hemodialysis as an outpatient.  At this time he does not have an adequate outpatient dialysis access.  Recommend placing a PermCath to allow for this.  Procedure, risks and benefits explained to the patient.  All questions answered.  The patient wishes to proceed. 2. Hyperlipidemia: On aspirin and statin for medical management. Encouraged good control as its slows the progression of atherosclerotic disease. 3. Diabetes: On appropriate medications. Encouraged good control as its slows the progression of atherosclerotic disease.  Discussed with Dew / Schnier  Sela Hua, PA-C  04/06/2019 2:48 PM  This note was created with Dragon medical transcription system.  Any error is purely unintentional

## 2019-04-06 NOTE — Progress Notes (Signed)
Nutrition Follow-up  RD working remotely.  DOCUMENTATION CODES:   Morbid obesity  INTERVENTION:  Provide Nepro Shake po BID, each supplement provides 425 kcal and 19 grams protein.  Provide Rena-vite QHS.  NUTRITION DIAGNOSIS:   Inadequate oral intake related to inability to eat as evidenced by NPO status.  Resolving - diet was advanced today.  GOAL:   Patient will meet greater than or equal to 90% of their needs  Progressing.  MONITOR:   PO intake, Supplement acceptance, Labs, Weight trends, I & O's  REASON FOR ASSESSMENT:   Ventilator, Consult Enteral/tube feeding initiation and management  ASSESSMENT:   42 year old male with PMHx of DM, HTN, HLD admitted with severe DKA with severe metabolic acidosis and severe respiratory failure requiring intubation on 6/12, also with seizures, aspiration PNA, and severe acute renal failure.  Patient was extubated yesterday. Per chart he is a poor historian. Following SLP evaluation today diet was advanced to dysphagia 3 with thin liquids. CRRT was stopped on 6/15. Patient had intermittent HD on 6/16. He is anuric. Plan for tunneled dialysis catheter placement tomorrow and next HD session.  Medications reviewed and include: B-complex with C QHS, Epogen 10000 units during HD, Novolog 0-15 units Q4hrs, Novolog 3 units Q4hrs, Lantus 25 units BID, pantoprazole, vancomycin, Precedex gtt, Keppra.  Labs reviewed: CBG 211-261, CO2 19, BUN 66, Creatinine 8.76.  Weight trend: 139.3 kg on 6/17; +2.3 kg from 6/13  Diet Order:   Diet Order            DIET DYS 3 Room service appropriate? Yes with Assist; Fluid consistency: Thin  Diet effective now             EDUCATION NEEDS:   No education needs have been identified at this time  Skin:  Skin Assessment: Reviewed RN Assessment  Last BM:  6/15: 125 mL per rectal tube  Height:   Ht Readings from Last 1 Encounters:  04/03/19 5\' 10"  (1.778 m)   Weight:   Wt Readings from Last 1  Encounters:  04/06/19 (!) 139.3 kg   Ideal Body Weight:  75.5 kg  BMI:  Body mass index is 44.06 kg/m.  Estimated Nutritional Needs:   Kcal:  9244-6286  Protein:  125-140 grams  Fluid:  UOP + 1 L  Willey Blade, MS, RD, LDN Office: [phone] Pager: [phone] After Hours/Weekend Pager: [phone]

## 2019-04-06 NOTE — Progress Notes (Signed)
After further assessment and discussion of patient's  status and medical condition during multidisciplinary rounds, the plan is outlined as below  1.  Patient extubated without sequela yesterday.  No respiratory distress should be able to transfer to floor today.  2.  Plans for PermCath tomorrow and dialysis tomorrow.  [facility] team's discussion with Dr. Juleen China.  3.  Hypertension noted, metoprolol and amlodipine restarted.  Further medication changes per nephrology.  4.  Discontinue Foley and Digni-shield.  Renold Don, MD [facility]

## 2019-04-06 NOTE — Progress Notes (Signed)
Pt transferred to 225 via bed with all belongings. Report called to Kathlee Nations, Therapist, sports.

## 2019-04-06 NOTE — Progress Notes (Signed)
[facility]  ROUNDING NOTE   Subjective:   Intermittent hemodialysis yesterday. Tolerated treatment well. UF even.   No urine output  Tmax 100.9  Continues to have diarrhea.   Objective:  Vital signs in last 24 hours:  Temp:  [99 F (37.2 C)-100.9 F (38.3 C)] 99 F (37.2 C) (06/17 0900) Pulse Rate:  [99-121] 108 (06/17 0900) Resp:  [19-39] 29 (06/17 0900) BP: (84-213)/(49-102) 184/85 (06/17 0900) SpO2:  [94 %-100 %] 96 % (06/17 0900) Weight:  [139.2 kg-139.3 kg] 139.3 kg (06/17 0500)  Weight change: 0 kg Filed Weights   04/05/19 1354 04/05/19 1715 04/06/19 0500  Weight: (!) 139.2 kg (!) 139.2 kg (!) 139.3 kg    Intake/Output: I/O last 3 completed shifts: In: 1387.5 [I.V.:807.2; NG/GT:480; IV Piggyback:100.3] Out: 700 [Stool:700]   Intake/Output this shift:  No intake/output data recorded.  Physical Exam: General: ill  Head: Dry mucosal membranes  Eyes: Anicteric  Neck: trachea midline  Lungs:  Clear   Heart: regular  Abdomen:  Soft, obese, +bowel sounds  Extremities: trace peripheral edema.  Neurologic: Intubated, sedated  Skin: No lesions  Access: L IJ temporary dialysis catheter    Basic Metabolic Panel: Recent Labs  Lab 04/03/19 1624 04/03/19 2016 04/04/19 0002 04/04/19 0430 04/04/19 0750 04/04/19 1208 04/04/19 1538 04/05/19 0434 04/06/19 0517  NA 135 131* 134* 136 136  --   --  137 138  K 4.3 4.0 3.8 3.2* 3.9  --   --  3.8 3.6  CL 100 98 102 102 102  --   --  103 101  CO2 23 21* 23 23 22   --   --  23 19*  GLUCOSE 284* 264* 269* 206* 280*  --   --  251* 255*  BUN 33* 31* 35* 32* 36*  --   --  55* 66*  CREATININE 5.57* 4.86* 5.16* 4.68* 4.98*  --   --  7.71* 8.76*  CALCIUM 8.3* 7.8* 8.2* 8.2* 8.4*  --   --  8.1* 8.3*  MG 2.3 2.0 2.1 2.1 2.1 2.1 2.0 2.0  --   PHOS 3.8 3.6 3.5 3.2 3.7  --   --   --   --     Liver Function Tests: Recent Labs  Lab 04/01/19 0530  04/02/19 0730  04/03/19 1624 04/03/19 2016 04/04/19 0002  04/04/19 0430 04/04/19 0750  AST 66*  --  32  --   --   --   --   --   --   ALT 33  --  29  --   --   --   --   --   --   ALKPHOS 174*  --  68  --   --   --   --   --   --   BILITOT 0.8  --  0.9  --   --   --   --   --   --   PROT 8.2*  --  6.2*  --   --   --   --   --   --   ALBUMIN 4.6   < > 3.4*   < > 3.3* 3.1* 2.9* 2.9* 2.9*   < > = values in this interval not displayed.   Recent Labs  Lab 04/01/19 0530  LIPASE 40   No results for input(s): AMMONIA in the last 168 hours.  CBC: Recent Labs  Lab 04/01/19 0530 04/02/19 0730 04/03/19 0524 04/04/19 0430 04/05/19 0434 04/05/19  2320  WBC 26.2* 9.0 8.5 8.9 9.9 15.4*  NEUTROABS 14.1*  --   --   --  6.5  --   HGB 13.7 10.9* 9.5* 9.5* 9.0* 9.7*  HCT 47.2 35.8* 29.8* 29.7* 27.9* 29.7*  MCV 104.7* 100.8* 99.0 94.6 94.3 92.0  PLT 414* 232 163 145* 161 207    Cardiac Enzymes: Recent Labs  Lab 04/01/19 1601 04/01/19 2155 04/02/19 2029 04/03/19 0524 04/03/19 0738 04/03/19 1624  CKTOTAL  --   --   --   --   --  894*  TROPONINI 0.72* 0.93* 0.38* 0.42* 0.46*  --     BNP: Invalid input(s): POCBNP  CBG: Recent Labs  Lab 04/05/19 1737 04/05/19 1933 04/05/19 2318 04/06/19 0321 04/06/19 0728  GLUCAP 186* 177* 192* 27* 12*    Microbiology: Results for orders placed or performed during the hospital encounter of 04/01/19  Culture, blood (routine x 2)     Status: None   Collection Time: 04/01/19  5:30 AM   Specimen: BLOOD  Result Value Ref Range Status   Specimen Description BLOOD BLOOD LEFT HAND  Final   Special Requests   Final    BOTTLES DRAWN AEROBIC AND ANAEROBIC Blood Culture results may not be optimal due to an inadequate volume of blood received in culture bottles   Culture   Final    NO GROWTH 5 DAYS Performed at [facility], [address] [place], [place] [address]    Report Status 04/06/2019 FINAL  Final  Urine culture     Status: None   Collection Time: 04/01/19  5:30 AM   Specimen:  Urine, Random  Result Value Ref Range Status   Specimen Description   Final    URINE, RANDOM Performed at [facility], [address] [place], [place] [address]    Special Requests   Final    NONE Performed at [facility] [address], [place], [place] [address]    Culture   Final    NO GROWTH Performed at [facility], [place] [address] [place], [place] [address]    Report Status 04/02/2019 FINAL  Final  SARS Coronavirus 2 (CEPHEID - Performed in [place] hospital lab), Hosp Order     Status: None   Collection Time: 04/01/19  5:30 AM   Specimen: Nasopharyngeal Swab  Result Value Ref Range Status   SARS Coronavirus 2 NEGATIVE NEGATIVE Final    Comment: (NOTE) If result is NEGATIVE SARS-CoV-2 target nucleic acids are NOT DETECTED. The SARS-CoV-2 RNA is generally detectable in upper and lower  respiratory specimens during the acute phase of infection. The lowest  concentration of SARS-CoV-2 viral copies this assay can detect is 250  copies / mL. A negative result does not preclude SARS-CoV-2 infection  and should not be used as the sole basis for treatment or other  patient management decisions.  A negative result may occur with  improper specimen collection / handling, submission of specimen other  than nasopharyngeal swab, presence of viral mutation(s) within the  areas targeted by this assay, and inadequate number of viral copies  (<250 copies / mL). A negative result must be combined with clinical  observations, patient history, and epidemiological information. If result is POSITIVE SARS-CoV-2 target nucleic acids are DETECTED. The SARS-CoV-2 RNA is generally detectable in upper and lower  respiratory specimens dur ing the acute phase of infection.  Positive  results are indicative of active infection with SARS-CoV-2.  Clinical  correlation  with patient history and other diagnostic information is  necessary to determine patient infection  status.  Positive results do  not rule out bacterial infection or co-infection with other viruses. If result is PRESUMPTIVE POSTIVE SARS-CoV-2 nucleic acids MAY BE PRESENT.   A presumptive positive result was obtained on the submitted specimen  and confirmed on repeat testing.  While 2019 novel coronavirus  (SARS-CoV-2) nucleic acids may be present in the submitted sample  additional confirmatory testing may be necessary for epidemiological  and / or clinical management purposes  to differentiate between  SARS-CoV-2 and other Sarbecovirus currently known to infect humans.  If clinically indicated additional testing with an alternate test  methodology [id number] is advised. The SARS-CoV-2 RNA is generally  detectable in upper and lower respiratory sp ecimens during the acute  phase of infection. The expected result is Negative. Fact Sheet for Patients:  StrictlyIdeas.no Fact Sheet for Healthcare Providers: BankingDealers.co.za This test is not yet approved or cleared by the [place] FDA and has been authorized for detection and/or diagnosis of SARS-CoV-2 by FDA under an Emergency Use Authorization (EUA).  This EUA will remain in effect (meaning this test can be used) for the duration of the COVID-19 declaration under Section 564(b)(1) of the Act, 21 U.S.C. section 360bbb-3(b)(1), unless the authorization is terminated or revoked sooner. Performed at [facility], [address] [place], [place] [address]   Culture, blood (routine x 2)     Status: None   Collection Time: 04/01/19  6:49 AM   Specimen: BLOOD  Result Value Ref Range Status   Specimen Description BLOOD BLOOD RIGHT HAND  Final   Special Requests   Final    BOTTLES DRAWN AEROBIC AND ANAEROBIC Blood Culture results may not be optimal due to an excessive volume of blood received in culture bottles   Culture   Final    NO GROWTH 5 DAYS Performed at [facility], [address] [place], [place] [address]    Report Status 04/06/2019 FINAL  Final  MRSA PCR Screening     Status: None   Collection Time: 04/01/19 10:58 AM   Specimen: Nasal Mucosa; Nasopharyngeal  Result Value Ref Range Status   MRSA by PCR NEGATIVE NEGATIVE Final    Comment:        The GeneXpert MRSA Assay (FDA approved for NASAL specimens only), is one component of a comprehensive MRSA colonization surveillance program. It is not intended to diagnose MRSA infection nor to guide or monitor treatment for MRSA infections. Performed at [facility], [address] [place], [place] [address]   Culture, respiratory (non-expectorated)     Status: None   Collection Time: 04/01/19 12:50 PM   Specimen: Tracheal Aspirate; Respiratory  Result Value Ref Range Status   Specimen Description   Final    TRACHEAL ASPIRATE Performed at [facility], [address] [place], [place] [address]    Special Requests   Final    NONE Performed at [facility], [address] [place], [place] [address]    Gram Stain   Final    MODERATE WBC PRESENT, PREDOMINANTLY PMN FEW SQUAMOUS EPITHELIAL CELLS PRESENT ABUNDANT GRAM POSITIVE COCCI RARE GRAM POSITIVE RODS Performed at [facility], [place] [address], [place],  [address]    Culture FEW METHICILLIN RESISTANT STAPHYLOCOCCUS AUREUS  Final   Report Status 04/04/2019 FINAL  Final   Organism ID, Bacteria METHICILLIN RESISTANT STAPHYLOCOCCUS AUREUS  Final  Susceptibility   Methicillin resistant staphylococcus aureus - MIC*    CIPROFLOXACIN <=0.5 SENSITIVE Sensitive     ERYTHROMYCIN <=0.25 SENSITIVE Sensitive     GENTAMICIN <=0.5 SENSITIVE Sensitive     OXACILLIN >=4 RESISTANT Resistant     TETRACYCLINE <=1 SENSITIVE Sensitive     VANCOMYCIN <=0.5 SENSITIVE Sensitive     TRIMETH/SULFA <=10 SENSITIVE Sensitive     CLINDAMYCIN <=0.25 SENSITIVE Sensitive     RIFAMPIN <=0.5 SENSITIVE  Sensitive     Inducible Clindamycin NEGATIVE Sensitive     * FEW METHICILLIN RESISTANT STAPHYLOCOCCUS AUREUS  C difficile quick scan w PCR reflex     Status: None   Collection Time: 04/04/19  9:07 PM   Specimen: STOOL  Result Value Ref Range Status   C Diff antigen NEGATIVE NEGATIVE Final   C Diff toxin NEGATIVE NEGATIVE Final   C Diff interpretation No C. difficile detected.  Final    Comment: Performed at [facility], [address] [place], [place] [address]    Coagulation Studies: No results for input(s): LABPROT, INR in the last 72 hours.  Urinalysis: No results for input(s): COLORURINE, LABSPEC, PHURINE, GLUCOSEU, HGBUR, BILIRUBINUR, KETONESUR, PROTEINUR, UROBILINOGEN, NITRITE, LEUKOCYTESUR in the last 72 hours.  Invalid input(s): APPERANCEUR    Imaging: No results found.   Medications:   . dexmedetomidine (PRECEDEX) IV infusion Stopped (04/05/19 1046)  . levETIRAcetam Stopped (04/05/19 1805)   . amLODipine  10 mg Oral Daily  . B-complex with vitamin C  1 tablet Per Tube QHS  . chlorhexidine gluconate (MEDLINE KIT)  15 mL Mouth Rinse BID  . Chlorhexidine Gluconate Cloth  6 each Topical Daily  . epoetin (EPOGEN/PROCRIT) injection  10,000 Units Intravenous Q T,Th,Sa-HD  . insulin aspart  0-15 Units Subcutaneous Q4H  . insulin aspart  3 Units Subcutaneous Q4H  . insulin glargine  25 Units Subcutaneous BID  . metoprolol succinate  50 mg Oral Daily  . pantoprazole sodium  40 mg Per Tube Daily  . sodium chloride flush  10-40 mL Intracatheter Q12H  . sodium chloride flush  3 mL Intravenous Q12H  . vancomycin variable dose per unstable renal function (pharmacist dosing)   Does not apply See admin instructions   acetaminophen **OR** acetaminophen, albuterol, heparin, hydrALAZINE, labetalol, ondansetron **OR** ondansetron (ZOFRAN) IV, polyethylene glycol, sodium chloride flush  Assessment/ Plan:    Mr. Roy Oconnell is a 43 y.o. black male with diabetes  mellitus type 2, hypertension, hyperlipidemia, prior history of kidney donation, chronic kidney disease stage III baseline creatinine 1.5 on August 27, 2018, who was admitted to [facility] on 04/01/2019 for evaluation of seizures and loss of consciousness.  1.  Acute renal failure on chronic kidney disease stage III baseline creatinine 1.5 08/27/2018 with history of renal donation.  Chronic kidney disease stage III with proteinuria. Secondary to solitary kidney, hypertension and diabetic nephropathy Requiring renal replacement therapy.  CRRT stopped on 6/15. Intermittent hemodialysis on 6/16 Anuric.  - Scan bladder for any urine output. If none, remove foley catheter - Schedule patient for tunneled dialysis catheter for tomorrow - Next hemodialysis treatment for tomorrow.   2.  Acute respiratory failure. Status post extubation on 6/16.   3.  Anemia with chronic kidney disease:   - EPO with HD treatment TTS  4. Seizures: started on levitiracetam.    5.  Diabetes mellitus type II with chronic kidney disease: noninsulin dependent prior to admission. Hemoglobin A1c of 14.8% - uncontrolled. Patient reports he was not able  to afford his medications.  Due to poor diabetic control, suspect renal recovery to be less likely.    LOS: 5 Katalin Colledge 6/17/20209:52 AM

## 2019-04-06 NOTE — Evaluation (Signed)
Clinical/Bedside Swallow Evaluation Patient Details  Name: Roy Oconnell MRN: [id number] Date of Birth: May 03, 1976  Today's Date: 04/06/2019 Time: SLP Start Time (ACUTE ONLY): 1150 SLP Stop Time (ACUTE ONLY): 1245 SLP Time Calculation (min) (ACUTE ONLY): 55 min  Past Medical History:  Past Medical History:  Diagnosis Date  . Diabetes mellitus without complication ([place])   . Hyperlipemia   . Hypertension    Past Surgical History: History reviewed. No pertinent surgical history. HPI:  Pt is a 43 y.o. male with a known history of Obesity, DM, HTN, HL brought in by EMS after his roommate saw patient having seizures.  Patient was given Narcan with no response.  Here his urine drug screen is negative.  Patient intubated on arrival to emergency room.  Has 2 peripheral IVs.  Started on propofol and fentanyl.  Labs showed acute kidney injury, DKA, lactic acid greater than 11 and WBC count of 25,000.  No source of infection found.  Pt was extubated s/p 4 days and is tolerating well w/ [place] O2 support.  He is verbally conversive; answering questions appropriately.  Noted bilat. UE weakness.    Assessment / Plan / Recommendation Clinical Impression  Pt appears to present w/ adequate oropharyngeal phase swallow function w/ trial consistencies assessed; he is at reduced risk for aspiration when following general aspiration precautions. Pt positioned upright in bed and given trials of ice chips, thin liquids and purees/soft solids w/ no immediate, overt s/s of aspiration noted; clear vocal quality followed and no decline in O2 sats or respiratory effort during/post trials. Pt helped to held cup when drinking - tolerated sips of thin liquids via straw also. Oral phase was c/b fairly timely bolus mastication and organization for A-P transfer for swallowing; he cleared orally given time. No oral residue or bolus loss noted. Pt was instructed to take his time and use SMALL bites/sips to lessen exertion/fatigue w/  task. Pt helped to feed self w/ setup though noted bilat. weakness in UEs. OM exam during bolus management appeared grossly WFL; no unilateral weakness noted during oral exam of individual movements. Recommend a Dysphagia level 3(cut meats as needed) diet w/ thin liquids - general aspiration precautions; Pills in Puree - Whole for safer swallowing at this time while in hospital(bed). Tray setup and assistance at meals including positioning upright d/t decreased strength in general. ST services can be available for further education if needed while admitted. Dietician f/u recommended. NSG/MD and updated.  SLP Visit Diagnosis: Dysphagia, unspecified (R13.10)    Aspiration Risk  (reduced following aspiration precautions)    Diet Recommendation  Dysphagia level 3 (cut meats as needed) w/ Thin liquids. General aspiration precautions. Tray setup and positioning at meals.   Medication Administration: Whole meds with puree(for safer swallowing currently)    Other  Recommendations Recommended Consults: (Dietician f/u) Oral Care Recommendations: Oral care BID;Patient independent with oral care;Staff/trained caregiver to provide oral care Other Recommendations: (n/a)   Follow up Recommendations None      Frequency and Duration (n/a)  (n/a)       Prognosis Prognosis for Safe Diet Advancement: Good Barriers to Reach Goals: (general weakness currently)      Swallow Study   General Date of Onset: 04/01/19 HPI: Pt is a 43 y.o. male with a known history of Obesity, DM, HTN, HL brought in by EMS after his roommate saw patient having seizures.  Patient was given Narcan with no response.  Here his urine drug screen is negative.  Patient intubated  on arrival to emergency room.  Has 2 peripheral IVs.  Started on propofol and fentanyl.  Labs showed acute kidney injury, DKA, lactic acid greater than 11 and WBC count of 25,000.  No source of infection found.  Pt was extubated s/p 4 days and is tolerating well w/  [place] O2 support.  He is verbally conversive; answering questions appropriately.  Noted bilat. UE weakness.  Type of Study: Bedside Swallow Evaluation Previous Swallow Assessment: none Diet Prior to this Study: NPO Temperature Spikes Noted: No(wbc min elevated) Respiratory Status: Nasal cannula(4 liters) History of Recent Intubation: Yes Length of Intubations (days): 4 days Date extubated: 04/05/19 Behavior/Cognition: Alert;Cooperative;Pleasant mood;Requires cueing(min) Oral Cavity Assessment: Within Functional Limits;Dry(min) Oral Care Completed by SLP: Recent completion by staff Oral Cavity - Dentition: Adequate natural dentition Vision: Functional for self-feeding Self-Feeding Abilities: Able to feed self;Needs set up;Needs assist Patient Positioning: Upright in bed(needed positioning) Baseline Vocal Quality: Normal Volitional Cough: Strong Volitional Swallow: Able to elicit    Oral/Motor/Sensory Function Overall Oral Motor/Sensory Function: Within functional limits   Ice Chips Ice chips: Within functional limits Presentation: Spoon(fed: 3 trials)   Thin Liquid Thin Liquid: Within functional limits Presentation: Cup;Self Fed;Straw(assisted; ~4 ozs)    Nectar Thick Nectar Thick Liquid: Not tested   Honey Thick Honey Thick Liquid: Not tested   Puree Puree: Within functional limits Presentation: Spoon(fed; 8 trials)   Solid     Solid: Within functional limits Presentation: Spoon(fed; 5 trials)       Orinda Kenner, MS, CCC-SLP Valena Ivanov 04/06/2019,5:38 PM

## 2019-04-06 NOTE — Progress Notes (Signed)
Pharmacy Antibiotic Note  Roy Oconnell is a 43 y.o. male admitted on 04/01/2019 for evaluation of seizures and loss of consciousness.  Pharmacy has been consulted for vancomycin dosing due to tracheal aspirate growing MRSA. Patient has past medical history significant for kidney donation, hypertension, hyperlipidema, CKD Stage III, and type 2 diabetes. Patient transitioned off CRRT on 6/15 and will require HD with initial session on 6/16. Next dialysis scheduled for 6/18 per nephrology note. PCT trending up 7.71 >8.76. Spiked a low grade fever on 6/16 that resolved after tylenol use.  Plan: -Patient with scheduled dialysis on 6/18. Will schedule vancomycin 1g IV to be infused with dialysis.  -Will continue to monitor daily for dialysis and schedule vancomycin as appropriate.  -Monitor vancomycin trough after 3rd dose prior to dialysis. Continue to follow and monitor daily   Height: 5\' 10"  (177.8 cm) Weight: (!) 307 lb 1.6 oz (139.3 kg) IBW/kg (Calculated) : 73  Temp (24hrs), Avg:100 F (37.8 C), Min:99 F (37.2 C), Max:100.9 F (38.3 C)  Recent Labs  Lab 04/01/19 0530 04/01/19 0818  04/01/19 1206  04/02/19 0730  04/02/19 2029 04/03/19 0524  04/04/19 0002 04/04/19 0430 04/04/19 0750 04/04/19 1208 04/05/19 0434 04/05/19 2320 04/06/19 0517  WBC 26.2*  --   --   --   --  9.0  --   --  8.5  --   --  8.9  --   --  9.9 15.4*  --   CREATININE 2.30*  --    < >  --    < > 4.64*   < > 4.48* 5.37*   < > 5.16* 4.68* 4.98*  --  7.71*  --  8.76*  LATICACIDVEN >11.0* 10.5*  --  7.2*  --   --   --  1.4  --   --   --   --   --   --   --   --   --   VANCORANDOM  --   --   --   --   --   --   --   --   --   --   --   --   --  10  --   --   --    < > = values in this interval not displayed.    Estimated Creatinine Clearance: 15.6 mL/min (A) (by C-G formula based on SCr of 8.76 mg/dL (H)).    Not on File  Antimicrobials this admission: Clindamycin 6/12 x 1  Zosyn 6/12 >> 6/15  Vancomycin  6/12 x1 Vancomycin 6/15 >>   Dose adjustments this admission: 6/12 vancomycin 2500mg  (trough level is 10) 6/15 vancomycin 1g x1 6/16 Vancomycin 1g x1  Microbiology results: 6/12 BCx: no growth x 5 days  6/12 UCx: negative  6/12 Sputum: MRSA 6/12 MRSA PCR: negative  6/12 COVID: negative  6/15 c.diff: Negative  Thank you for allowing pharmacy to be a part of this patient's care.  Marisa Cyphers, PharmD Candidate 04/06/2019 12:58 PM

## 2019-04-06 NOTE — Progress Notes (Signed)
[place] [facility] [place] NAME: Roy Oconnell    MR#:  [id number]  DATE OF BIRTH:  1976-09-06  SUBJECTIVE:  CHIEF COMPLAINT:   Chief Complaint  Patient presents with  . Seizures  . Loss of Consciousness   Extubated and awake. Poor historian.  REVIEW OF SYSTEMS:  Review of Systems  Unable to perform ROS: Intubated    DRUG ALLERGIES:  Not on File VITALS:  Blood pressure (!) 184/85, pulse (!) 108, temperature 99 F (37.2 C), resp. rate (!) 29, height 5\' 10"  (1.778 m), weight (!) 139.3 kg, SpO2 96 %. PHYSICAL EXAMINATION:  Physical Exam Constitutional:      Comments: Morbid obesity.  On ventilation.  HENT:     Head: Normocephalic.  Eyes:     General: No scleral icterus.    Conjunctiva/sclera: Conjunctivae normal.     Pupils: Pupils are equal, round, and reactive to light.  Neck:     Musculoskeletal: Neck supple.     Vascular: No JVD.     Trachea: No tracheal deviation.  Cardiovascular:     Rate and Rhythm: Normal rate and regular rhythm.     Heart sounds: Normal heart sounds. No murmur. No gallop.   Pulmonary:     Effort: Pulmonary effort is normal. No respiratory distress.     Breath sounds: Normal breath sounds. No wheezing or rales.  Abdominal:     General: Bowel sounds are normal. There is no distension.     Palpations: Abdomen is soft.     Tenderness: There is no abdominal tenderness. There is no rebound.  Musculoskeletal:        General: No tenderness.  Skin:    Findings: No erythema or rash.    LABORATORY PANEL:  Male CBC Recent Labs  Lab 04/05/19 2320  WBC 15.4*  HGB 9.7*  HCT 29.7*  PLT 207   ------------------------------------------------------------------------------------------------------------------ Chemistries  Recent Labs  Lab 04/02/19 0730  04/05/19 0434 04/06/19 0517  NA 138   < > 137 138  K 4.8   < > 3.8 3.6  CL 104   < > 103 101  CO2 22   < > 23 19*  GLUCOSE 322*   < > 251* 255*   BUN 28*   < > 55* 66*  CREATININE 4.64*   < > 7.71* 8.76*  CALCIUM 8.0*   < > 8.1* 8.3*  MG  --    < > 2.0  --   AST 32  --   --   --   ALT 29  --   --   --   ALKPHOS 68  --   --   --   BILITOT 0.9  --   --   --    < > = values in this interval not displayed.   RADIOLOGY:  No results found. ASSESSMENT AND PLAN:   *DKA, hyperglycemia and diabetes. Improved with insulin drip and IV fluid support.  Anion gap close to normal. Continue sliding scale and Lantus.  *New onset seizures ON keppra  * Sepsis shock and aspiration pneumonia  IV Zosyn.  Blood cultures NGTD.  *Acute kidney injury on CKD 3 secondary to DKA, ATN and sepsis. Patient was treated with IV fluid, but renal function has been worsened.   He was on CRRT.  Started hemodialysis. Appreciate nephrology input  * Elevated troponin.  Possible due to demanding ischemia secondary to above.  *Acute metabolic encephalopathy due to seizures and  DKA Improving  *Acute respiratory failure with hypoxia and hypercapnia due aspiration PNA Resolved  Discussed with Dr. Patsey Berthold. All the records are reviewed and case discussed with Care. Management/Social Worker. Management plans discussed with the patient, family and they are in agreement.  CODE STATUS: Full Code  TOTAL TIME TAKING CARE OF THIS PATIENT: 35 minutes.   Neita Carp M.D on 04/06/2019 at 12:25 PM  Between 7am to 6pm - Pager - [phone]  After 6pm go to [email or web address] - [facility]

## 2019-04-07 ENCOUNTER — Encounter
Admission: EM | Disposition: A | Discharge status: Home Health | Payer: Self-pay | Source: Home / Self Care | Attending: Internal Medicine

## 2019-04-07 DIAGNOSIS — N186 End stage renal disease: Secondary | ICD-10-CM

## 2019-04-07 HISTORY — PX: DIALYSIS/PERMA CATHETER INSERTION: CATH118288

## 2019-04-07 LAB — MAGNESIUM: Magnesium: 2 mg/dL (ref 1.7–2.4)

## 2019-04-07 LAB — GLUCOSE, CAPILLARY
Glucose-Capillary: 132 mg/dL — ABNORMAL HIGH (ref 70–99)
Glucose-Capillary: 151 mg/dL — ABNORMAL HIGH (ref 70–99)
Glucose-Capillary: 193 mg/dL — ABNORMAL HIGH (ref 70–99)
Glucose-Capillary: 201 mg/dL — ABNORMAL HIGH (ref 70–99)
Glucose-Capillary: 221 mg/dL — ABNORMAL HIGH (ref 70–99)
Glucose-Capillary: 238 mg/dL — ABNORMAL HIGH (ref 70–99)
Glucose-Capillary: 266 mg/dL — ABNORMAL HIGH (ref 70–99)

## 2019-04-07 LAB — VANCOMYCIN, RANDOM: Vancomycin Rm: 28

## 2019-04-07 LAB — BASIC METABOLIC PANEL
Anion gap: 19 — ABNORMAL HIGH (ref 5–15)
BUN: 86 mg/dL — ABNORMAL HIGH (ref 6–20)
CO2: 20 mmol/L — ABNORMAL LOW (ref 22–32)
Calcium: 8.2 mg/dL — ABNORMAL LOW (ref 8.9–10.3)
Chloride: 101 mmol/L (ref 98–111)
Creatinine, Ser: 11.49 mg/dL — ABNORMAL HIGH (ref 0.61–1.24)
GFR calc Af Amer: 6 mL/min — ABNORMAL LOW (ref >60)
GFR calc non Af Amer: 5 mL/min — ABNORMAL LOW (ref >60)
Glucose, Bld: 246 mg/dL — ABNORMAL HIGH (ref 70–99)
Potassium: 3.5 mmol/L (ref 3.5–5.1)
Sodium: 140 mmol/L (ref 135–145)

## 2019-04-07 LAB — QUANTIFERON-TB GOLD PLUS (RQFGPL)
QuantiFERON Mitogen Value: 0.29 IU/mL
QuantiFERON Nil Value: 0.01 IU/mL
QuantiFERON TB1 Ag Value: 0.01 IU/mL
QuantiFERON TB2 Ag Value: 0.01 IU/mL

## 2019-04-07 LAB — QUANTIFERON-TB GOLD PLUS: QuantiFERON-TB Gold Plus: UNDETERMINED — AB

## 2019-04-07 SURGERY — DIALYSIS/PERMA CATHETER INSERTION
Anesthesia: Choice

## 2019-04-07 MED ORDER — MORPHINE SULFATE (PF) 2 MG/ML IV SOLN
2.0000 mg | Freq: Once | INTRAVENOUS | Status: AC
  Administered 2019-04-07: 2 mg via INTRAVENOUS
  Filled 2019-04-07: qty 1

## 2019-04-07 MED ORDER — FAMOTIDINE 20 MG PO TABS
40.0000 mg | ORAL_TABLET | Freq: Once | ORAL | Status: AC | PRN
  Administered 2019-04-11: 40 mg via ORAL
  Filled 2019-04-07: qty 2

## 2019-04-07 MED ORDER — HEPARIN SODIUM (PORCINE) 10000 UNIT/ML IJ SOLN
INTRAMUSCULAR | Status: AC
  Filled 2019-04-07: qty 1

## 2019-04-07 MED ORDER — DIPHENHYDRAMINE HCL 50 MG/ML IJ SOLN: 50.0000 mg | Freq: Once | INTRAMUSCULAR | Status: DC | PRN

## 2019-04-07 MED ORDER — MIDAZOLAM HCL 2 MG/2ML IJ SOLN
INTRAMUSCULAR | Status: DC | PRN
  Administered 2019-04-07 (×2): 1 mg via INTRAVENOUS

## 2019-04-07 MED ORDER — HYDROMORPHONE HCL 1 MG/ML IJ SOLN
1.0000 mg | Freq: Once | INTRAMUSCULAR | Status: AC | PRN
  Administered 2019-04-10: 1 mg via INTRAVENOUS
  Filled 2019-04-07: qty 1

## 2019-04-07 MED ORDER — MIDAZOLAM HCL 2 MG/2ML IJ SOLN
INTRAMUSCULAR | Status: AC
  Filled 2019-04-07: qty 4

## 2019-04-07 MED ORDER — LIDOCAINE-EPINEPHRINE (PF) 1 %-1:200000 IJ SOLN
INTRAMUSCULAR | Status: AC
  Filled 2019-04-07: qty 30

## 2019-04-07 MED ORDER — VANCOMYCIN HCL IN DEXTROSE 1-5 GM/200ML-% IV SOLN
1000.0000 mg | Freq: Once | INTRAVENOUS | Status: AC
  Administered 2019-04-07: 1000 mg via INTRAVENOUS
  Filled 2019-04-07: qty 200

## 2019-04-07 MED ORDER — SODIUM CHLORIDE 0.9 % IV SOLN
INTRAVENOUS | Status: DC
  Administered 2019-04-07: 1000 mL via INTRAVENOUS

## 2019-04-07 MED ORDER — CEFAZOLIN SODIUM-DEXTROSE 1-4 GM/50ML-% IV SOLN
1.0000 g | Freq: Once | INTRAVENOUS | Status: AC
  Administered 2019-04-07: 01:00:00 1 g via INTRAVENOUS
  Filled 2019-04-07: qty 50

## 2019-04-07 MED ORDER — METHYLPREDNISOLONE SODIUM SUCC 125 MG IJ SOLR: 125.0000 mg | Freq: Once | INTRAMUSCULAR | Status: DC | PRN

## 2019-04-07 MED ORDER — FENTANYL CITRATE (PF) 100 MCG/2ML IJ SOLN
INTRAMUSCULAR | Status: AC
  Filled 2019-04-07: qty 2

## 2019-04-07 MED ORDER — FENTANYL CITRATE (PF) 100 MCG/2ML IJ SOLN
INTRAMUSCULAR | Status: DC | PRN
  Administered 2019-04-07 (×2): 25 ug via INTRAVENOUS
  Administered 2019-04-07: 50 ug via INTRAVENOUS

## 2019-04-07 MED ORDER — MIDAZOLAM HCL 2 MG/ML PO SYRP
8.0000 mg | ORAL_SOLUTION | Freq: Once | ORAL | Status: DC | PRN
  Filled 2019-04-07: qty 4

## 2019-04-07 MED ORDER — IRBESARTAN 150 MG PO TABS
300.0000 mg | ORAL_TABLET | Freq: Every day | ORAL | Status: DC
  Administered 2019-04-07 – 2019-04-15 (×9): 300 mg via ORAL
  Filled 2019-04-07 (×9): qty 2

## 2019-04-07 MED ORDER — LABETALOL HCL 100 MG PO TABS
100.0000 mg | ORAL_TABLET | Freq: Two times a day (BID) | ORAL | Status: DC
  Administered 2019-04-07 – 2019-04-10 (×5): 100 mg via ORAL
  Filled 2019-04-07 (×9): qty 1

## 2019-04-07 MED ORDER — INSULIN ASPART 100 UNIT/ML ~~LOC~~ SOLN
3.0000 [IU] | Freq: Three times a day (TID) | SUBCUTANEOUS | Status: DC
  Filled 2019-04-07: qty 1

## 2019-04-07 MED ORDER — CEFAZOLIN SODIUM-DEXTROSE 1-4 GM/50ML-% IV SOLN
INTRAVENOUS | Status: AC
  Filled 2019-04-07: qty 50

## 2019-04-07 MED ORDER — INSULIN GLARGINE 100 UNIT/ML ~~LOC~~ SOLN
30.0000 [IU] | Freq: Two times a day (BID) | SUBCUTANEOUS | Status: DC
  Administered 2019-04-07 – 2019-04-11 (×9): 30 [IU] via SUBCUTANEOUS
  Filled 2019-04-07 (×11): qty 0.3

## 2019-04-07 MED ORDER — ONDANSETRON HCL 4 MG/2ML IJ SOLN: 4.0000 mg | Freq: Four times a day (QID) | INTRAMUSCULAR | Status: DC | PRN

## 2019-04-07 SURGICAL SUPPLY — 11 items
CANNULA 5F STIFF (CANNULA) ×3 IMPLANT
CATH PALINDROME RT-P 15FX19CM (CATHETERS) IMPLANT
CATH PALINDROME RT-P 15FX23CM (CATHETERS) ×3 IMPLANT
COVER PROBE U/S 5X48 (MISCELLANEOUS) ×3 IMPLANT
DERMABOND ADVANCED (GAUZE/BANDAGES/DRESSINGS) ×2
DERMABOND ADVANCED .7 DNX12 (GAUZE/BANDAGES/DRESSINGS) ×1 IMPLANT
GUIDEWIRE EMER 3M J .025X150CM (WIRE) ×3 IMPLANT
PACK ANGIOGRAPHY (CUSTOM PROCEDURE TRAY) ×3 IMPLANT
SUT MNCRL AB 4-0 PS2 18 (SUTURE) ×3 IMPLANT
SUT PROLENE 0 CT 1 30 (SUTURE) ×3 IMPLANT
TOWEL OR 17X26 4PK STRL BLUE (TOWEL DISPOSABLE) ×3 IMPLANT

## 2019-04-07 NOTE — Progress Notes (Signed)
HD Tx completed, tolerated well   04/07/19 1708  Vital Signs  Pulse Rate (!) 101  Pulse Rate Source Monitor  Resp (!) 28  BP (!) 174/93  BP Location Right Arm  BP Method Automatic  Patient Position (if appropriate) Lying  Oxygen Therapy  SpO2 100 %  O2 Device Room Air  Pulse Oximetry Type Continuous  During Hemodialysis Assessment  HD Safety Checks Performed Yes  KECN 58.5 KECN  Dialysis Fluid Bolus Normal Saline  Bolus Amount (mL) 250 mL  Intra-Hemodialysis Comments Tx completed;Tolerated well

## 2019-04-07 NOTE — Progress Notes (Addendum)
Inpatient Diabetes Program Recommendations  AACE/ADA: New Consensus Statement on Inpatient Glycemic Control (2015)  Target Ranges:  Prepandial:   less than 140 mg/dL      Peak postprandial:   less than 180 mg/dL (1-2 hours)      Critically ill patients:  140 - 180 mg/dL   Results for LARKIN, MORELOS (MRN [id number]) as of 04/07/2019 08:16  Ref. Range 04/05/2019 23:18 04/06/2019 03:21 04/06/2019 07:28 04/06/2019 11:43 04/06/2019 16:31 04/06/2019 19:41 04/06/2019 22:50  Glucose-Capillary Latest Ref Range: 70 - 99 mg/dL 192 (H)  6 units NOVOLOG  211 (H)  8 units NOVOLOG  238 (H)  8 units NOVOLOG  261 (H)  11 units NOVOLOG +  25 units LANTUS given at 1:15pm  242 (H)  5 units NOVOLOG  231 (H) 265 (H)  8 units NOVOLOG    Results for ADYN, SERNA (MRN [id number]) as of 04/07/2019 08:16  Ref. Range 04/07/2019 00:19 04/07/2019 04:45 04/07/2019 07:39  Glucose-Capillary Latest Ref Range: 70 - 99 mg/dL 266 (H)  8 units NOVOLOG +  25 units LANTUS given at 1:27am  238 (H)  5 units NOVOLOG  221 (H)   Results for ELVYN, KROHN (MRN [id number]) as of 04/04/2019 07:23  Ref. Range 04/01/2019 12:04  Hemoglobin A1C Latest Ref Range: 4.8 - 5.6 % 14.8 (H)  (378 mg/dl)     Admit New Onset Seizures/ DKA/ Acute Kidney Injury/ Possible Sepsis (Glucose 739, CO2 9, AG 30)  History: DM2   Home DM Meds: Glipizide 10 mg BID   Current Orders: Lantus 30 units BID                            Novolog Moderate Correction Scale/ SSI (0-15 units) Q4 hours      Novolog 3 units TID with meals      NPO for tunneled Dialysis Catheter today.  Will need Insulin at time of discharge given A1c of 14.8%.  Note that Lantus increased to 30 units BID today.  Plan to see pt today to discuss going home on insulin.  Note that Care Management team has been consulted given pt without PCP nor Insurance.     Addendum 1:30pm- Met with pt today to discuss current A1c of 14.8%.  Pt somewhat sleepy  post-cath placement.  Discussed with pt that he was admitted in DKA with glucose of 739 mg/dl.  Explained what DKA and treatment for DKA are.  Pt told me he's had diabetes for a few years.  Was prescribed Glipizide 10 mg BID.  Took the Glipizide on a regular basis in the beginning but did admit to not being consistent with taking his meds over the last year.  Lost his health insurance.  Used to see Alanson Aly with [facility] in [facility] saw PCP on 08/27/2018.  Has CBG meter at home and supplies.  Not checking CBGs consistently either.  Discussed with pt that given his A1c of 14.8% and his current kidney issues, he will likely need to take insulin at home.  Discussed with pt that we will have him begin practicing giving his injections so he can feel comfortable with giving insulin when it comes time to d/c home.  Also discussed with pt taht the Care Management team will be working on helping him get set up with a local MD for primary care and we will try to help him get affordable medications.  Plan revisit with pt  tomorrow (06/19).     --Will follow patient during hospitalization--  Wyn Quaker RN, MSN, CDE Diabetes Coordinator Inpatient Glycemic Control Team Team Pager: [phone] (8a-5p)

## 2019-04-07 NOTE — H&P (Signed)
[facility] History & Physical Update  The patient was interviewed and re-examined.  The patient's previous History and Physical has been reviewed and is unchanged.  There is no change in the plan of care. We plan to proceed with the scheduled procedure.  Leotis Pain, MD  04/07/2019, 9:06 AM

## 2019-04-07 NOTE — Progress Notes (Signed)
Pre HD Assessment    04/07/19 1400  Neurological  Level of Consciousness Alert  Orientation Level Oriented X4  Respiratory  Respiratory Pattern Tachypnea  Chest Assessment Chest expansion symmetrical  Bilateral Breath Sounds Clear;Diminished  Cough None  Cardiac  Pulse Regular  Heart Sounds S1, S2  ECG Monitor Yes  Cardiac Rhythm ST  Vascular  R Radial Pulse +2  L Radial Pulse +2  Generalized Edema +2  Psychosocial  Psychosocial (WDL) WDL  Patient Behaviors Cooperative;Calm

## 2019-04-07 NOTE — Op Note (Signed)
OPERATIVE NOTE    PRE-OPERATIVE DIAGNOSIS: 1. ESRD   POST-OPERATIVE DIAGNOSIS: same as above  PROCEDURE: 1. Fluoroscopic guidance for placement of catheter 2. Placement of a 23 cm tip to cuff tunneled hemodialysis catheter via the right internal jugular vein  SURGEON: Leotis Pain, MD  ANESTHESIA:  Local with Moderate conscious sedation for approximately 30 minutes using 2 mg of Versed and 100 mcg of Fentanyl  ESTIMATED BLOOD LOSS: 10 cc  FLUORO TIME: less than one minute  CONTRAST: none  FINDING(S): 1.  None  SPECIMEN(S):  None  INDICATIONS:   Roy Oconnell is a 43 y.o.male who presents with renal failure and now is going to need long-term dialysis.  The patient needs long term dialysis access for their ESRD, and a Permcath is necessary at this time.  Risks and benefits are discussed and informed consent is obtained.    DESCRIPTION: After obtaining full informed written consent, the patient was brought back to the vascular suited. The patient's right neck and chest were sterilely prepped and draped in a sterile surgical field was created.  Due to the existing temporary dialysis catheter in the left jugular vein and the central line in the right jugular vein, we were somewhat limited in where we could place the access with a new stick and I elected to re-wire the central line on the right.  This was placed extremely cephalad in the neck making the angle somewhat difficult, but this was still a better option than the groin.  Moderate conscious sedation was administered during a face to face encounter with the patient throughout the procedure with my supervision of the RN administering medicines and monitoring the patient's vital signs, pulse oximetry, telemetry and mental status throughout from the start of the procedure until the patient was taken to the recovery room.  The existing triple-lumen catheter was then rewired with a 0.025 wire and the triple-lumen catheter was removed  after the area was locally anesthetized.  A micropuncture catheter was then placed over the wire and the 025 wire was removed, replacing it with a typical more stable 0.035 wire.  After skin nick and dilatation, the peel-away sheath was placed over this wire and the wire and dilator were removed. I then turned my attention to an area under the clavicle. Approximately 1-2 fingerbreadths below the clavicle a small counterincision was created and tunneled from the subclavicular incision to the access site. Using fluoroscopic guidance, a 23 centimeter tip to cuff tunneled hemodialysis catheter was selected, and tunneled from the subclavicular incision to the access site. It was then placed through the peel-away sheath and the peel-away sheath was removed. Using fluoroscopic guidance the catheter tips were parked in the right atrium. The appropriate distal connectors were placed. It withdrew blood well and flushed easily with heparinized saline and a concentrated heparin solution was then placed. It was secured to the chest wall with 2 Prolene sutures. The access incision was closed single 4-0 Monocryl. A 4-0 Monocryl pursestring suture was placed around the exit site. Sterile dressings were placed. The patient tolerated the procedure well and was taken to the recovery room in stable condition.  COMPLICATIONS: None  CONDITION: Stable  Leotis Pain, MD 04/07/2019 10:59 AM   This note was created with Dragon Medical transcription system. Any errors in dictation are purely unintentional.

## 2019-04-07 NOTE — Progress Notes (Signed)
Post HD Assessment    04/07/19 1725  Neurological  Level of Consciousness Alert  Orientation Level Oriented X4  Respiratory  Respiratory Pattern Tachypnea  Chest Assessment Chest expansion symmetrical  Bilateral Breath Sounds Clear  Cough None  Cardiac  Pulse Regular  Heart Sounds S1, S2  ECG Monitor Yes  Cardiac Rhythm ST  Vascular  R Radial Pulse +2  L Radial Pulse +2  Generalized Edema +2  Psychosocial  Psychosocial (WDL) WDL  Patient Behaviors Cooperative;Calm

## 2019-04-07 NOTE — Plan of Care (Signed)

## 2019-04-07 NOTE — Progress Notes (Signed)
Post HD Tx    04/07/19 1715  Hand-Off documentation  Report given to (Full Name) Ruthy Dick, RN   Report received from (Full Name) Beatris Ship, RN   Vital Signs  Temp 98.7 F (37.1 C)  Temp Source Oral  Pulse Rate Source Monitor  BP (!) 176/92  BP Location Right Arm  BP Method Automatic  Patient Position (if appropriate) Lying  Oxygen Therapy  O2 Device Room Air  Pulse Oximetry Type Continuous  Pain Assessment  Pain Scale 0-10  Pain Score 0  Dialysis Weight  Weight (!) 138.5 kg  Type of Weight Post-Dialysis  Hemodialysis Catheter Right Subclavian Double lumen Permanent (Tunneled)  Placement Date/Time: 04/07/19 1043   Time Out: Correct patient;Correct site;Correct procedure  Maximum sterile barrier precautions: Hand hygiene;Cap;Mask;Sterile gown;Sterile gloves;Large sterile sheet  Site Prep: Chlorhexidine (preferred)  Local Anes...  Site Condition No complications  Blue Lumen Status Heparin locked  Red Lumen Status Heparin locked  Post treatment catheter status Capped and Clamped

## 2019-04-07 NOTE — Progress Notes (Signed)
[facility]  ROUNDING NOTE   Subjective:   Permcath placed today. Seen and examined on hemodialysis treatment. UF of 2.5 liters. Tolerating treatment well.     HEMODIALYSIS FLOWSHEET:  Blood Flow Rate (mL/min): 400 mL/min Arterial Pressure (mmHg): -210 mmHg Venous Pressure (mmHg): 150 mmHg Transmembrane Pressure (mmHg): 50 mmHg Ultrafiltration Rate (mL/min): 1000 mL/min Dialysate Flow Rate (mL/min): 600 ml/min Conductivity: Machine : 13.7 Conductivity: Machine : 13.7 Dialysis Fluid Bolus: Normal Saline Bolus Amount (mL): 250 mL    Objective:  Vital signs in last 24 hours:  Temp:  [97.5 F (36.4 C)-98.9 F (37.2 C)] 97.8 F (36.6 C) (06/18 1403) Pulse Rate:  [97-110] 100 (06/18 1500) Resp:  [12-29] 12 (06/18 1500) BP: (161-206)/(83-121) 195/104 (06/18 1500) SpO2:  [94 %-100 %] 100 % (06/18 1500) Weight:  [141.5 kg] 141.5 kg (06/18 1403)  Weight change: 2.3 kg Filed Weights   04/06/19 0500 04/07/19 0500 04/07/19 1403  Weight: (!) 139.3 kg (!) 141.5 kg (!) 141.5 kg    Intake/Output: I/O last 3 completed shifts: In: 38.2 [IV Piggyback:38.2] Out: 100 [Stool:100]   Intake/Output this shift:  Total I/O In: 100 [IV Piggyback:100] Out: -   Physical Exam: General: Laying in bed  Head: Moist mucosal membranes  Eyes: Anicteric  Neck: trachea midline  Lungs:  Clear   Heart: regular  Abdomen:  Soft, obese, +bowel sounds  Extremities: No peripheral edema.  Neurologic: Alert and oriented.   Skin: No lesions  Access: L IJ temporary dialysis catheter, RIJ permcath 9/89    Basic Metabolic Panel: Recent Labs  Lab 04/03/19 1624 04/03/19 2016 04/04/19 0002 04/04/19 0430 04/04/19 0750 04/04/19 1208 04/04/19 1538 04/05/19 0434 04/06/19 0517 04/07/19 0408  NA 135 131* 134* 136 136  --   --  137 138 140  K 4.3 4.0 3.8 3.2* 3.9  --   --  3.8 3.6 3.5  CL 100 98 102 102 102  --   --  103 101 101  CO2 23 21* 23 23 22   --   --  23 19* 20*  GLUCOSE 284*  264* 269* 206* 280*  --   --  251* 255* 246*  BUN 33* 31* 35* 32* 36*  --   --  55* 66* 86*  CREATININE 5.57* 4.86* 5.16* 4.68* 4.98*  --   --  7.71* 8.76* 11.49*  CALCIUM 8.3* 7.8* 8.2* 8.2* 8.4*  --   --  8.1* 8.3* 8.2*  MG 2.3 2.0 2.1 2.1 2.1 2.1 2.0 2.0  --  2.0  PHOS 3.8 3.6 3.5 3.2 3.7  --   --   --   --   --     Liver Function Tests: Recent Labs  Lab 04/01/19 0530  04/02/19 0730  04/03/19 1624 04/03/19 2016 04/04/19 0002 04/04/19 0430 04/04/19 0750  AST 66*  --  32  --   --   --   --   --   --   ALT 33  --  29  --   --   --   --   --   --   ALKPHOS 174*  --  68  --   --   --   --   --   --   BILITOT 0.8  --  0.9  --   --   --   --   --   --   PROT 8.2*  --  6.2*  --   --   --   --   --   --  ALBUMIN 4.6   < > 3.4*   < > 3.3* 3.1* 2.9* 2.9* 2.9*   < > = values in this interval not displayed.   Recent Labs  Lab 04/01/19 0530  LIPASE 40   No results for input(s): AMMONIA in the last 168 hours.  CBC: Recent Labs  Lab 04/01/19 0530 04/02/19 0730 04/03/19 0524 04/04/19 0430 04/05/19 0434 04/05/19 2320  WBC 26.2* 9.0 8.5 8.9 9.9 15.4*  NEUTROABS 14.1*  --   --   --  6.5  --   HGB 13.7 10.9* 9.5* 9.5* 9.0* 9.7*  HCT 47.2 35.8* 29.8* 29.7* 27.9* 29.7*  MCV 104.7* 100.8* 99.0 94.6 94.3 92.0  PLT 414* 232 163 145* 161 207    Cardiac Enzymes: Recent Labs  Lab 04/01/19 1601 04/01/19 2155 04/02/19 2029 04/03/19 0524 04/03/19 0738 04/03/19 1624  CKTOTAL  --   --   --   --   --  894*  TROPONINI 0.72* 0.93* 0.38* 0.42* 0.46*  --     BNP: Invalid input(s): POCBNP  CBG: Recent Labs  Lab 04/07/19 0019 04/07/19 0445 04/07/19 0739 04/07/19 0926 04/07/19 1303  GLUCAP 266* 238* 221* 201* 49*    Microbiology: Results for orders placed or performed during the hospital encounter of 04/01/19  Culture, blood (routine x 2)     Status: None   Collection Time: 04/01/19  5:30 AM   Specimen: BLOOD  Result Value Ref Range Status   Specimen Description BLOOD  BLOOD LEFT HAND  Final   Special Requests   Final    BOTTLES DRAWN AEROBIC AND ANAEROBIC Blood Culture results may not be optimal due to an inadequate volume of blood received in culture bottles   Culture   Final    NO GROWTH 5 DAYS Performed at [facility], [address], [place], [place] [address]    Report Status 04/06/2019 FINAL  Final  Urine culture     Status: None   Collection Time: 04/01/19  5:30 AM   Specimen: Urine, Random  Result Value Ref Range Status   Specimen Description   Final    URINE, RANDOM Performed at [facility], [address], [place], [place] [address]    Special Requests   Final    NONE Performed at [facility], [address], [place], [place] [address]    Culture   Final    NO GROWTH Performed at [facility], [place] [address], [place], [place] [address]    Report Status 04/02/2019 FINAL  Final  SARS Coronavirus 2 (CEPHEID - Performed in [facility] hospital lab), Hosp Order     Status: None   Collection Time: 04/01/19  5:30 AM   Specimen: Nasopharyngeal Swab  Result Value Ref Range Status   SARS Coronavirus 2 NEGATIVE NEGATIVE Final    Comment: (NOTE) If result is NEGATIVE SARS-CoV-2 target nucleic acids are NOT DETECTED. The SARS-CoV-2 RNA is generally detectable in upper and lower  respiratory specimens during the acute phase of infection. The lowest  concentration of SARS-CoV-2 viral copies this assay can detect is 250  copies / mL. A negative result does not preclude SARS-CoV-2 infection  and should not be used as the sole basis for treatment or other  patient management decisions.  A negative result may occur with  improper specimen collection / handling, submission of specimen other  than nasopharyngeal swab, presence of viral mutation(s) within the  areas targeted by this assay, and inadequate number of  viral copies  (<250 copies / mL). A negative result must be combined with clinical  observations,  patient history, and epidemiological information. If result is POSITIVE SARS-CoV-2 target nucleic acids are DETECTED. The SARS-CoV-2 RNA is generally detectable in upper and lower  respiratory specimens dur ing the acute phase of infection.  Positive  results are indicative of active infection with SARS-CoV-2.  Clinical  correlation with patient history and other diagnostic information is  necessary to determine patient infection status.  Positive results do  not rule out bacterial infection or co-infection with other viruses. If result is PRESUMPTIVE POSTIVE SARS-CoV-2 nucleic acids MAY BE PRESENT.   A presumptive positive result was obtained on the submitted specimen  and confirmed on repeat testing.  While 2019 novel coronavirus  (SARS-CoV-2) nucleic acids may be present in the submitted sample  additional confirmatory testing may be necessary for epidemiological  and / or clinical management purposes  to differentiate between  SARS-CoV-2 and other Sarbecovirus currently known to infect humans.  If clinically indicated additional testing with an alternate test  methodology [phone]) is advised. The SARS-CoV-2 RNA is generally  detectable in upper and lower respiratory sp ecimens during the acute  phase of infection. The expected result is Negative. Fact Sheet for Patients:  StrictlyIdeas.no Fact Sheet for Healthcare Providers: BankingDealers.co.za This test is not yet approved or cleared by the [place] FDA and has been authorized for detection and/or diagnosis of SARS-CoV-2 by FDA under an Emergency Use Authorization (EUA).  This EUA will remain in effect (meaning this test can be used) for the duration of the COVID-19 declaration under Section 564(b)(1) of the Act, 21 U.S.C. section 360bbb-3(b)(1), unless the authorization is terminated or revoked sooner. Performed at [facility], [address]  [place], [place] [address]   Culture, blood (routine x 2)     Status: None   Collection Time: 04/01/19  6:49 AM   Specimen: BLOOD  Result Value Ref Range Status   Specimen Description BLOOD BLOOD RIGHT HAND  Final   Special Requests   Final    BOTTLES DRAWN AEROBIC AND ANAEROBIC Blood Culture results may not be optimal due to an excessive volume of blood received in culture bottles   Culture   Final    NO GROWTH 5 DAYS Performed at [facility], [address] [place], [place] [address]    Report Status 04/06/2019 FINAL  Final  MRSA PCR Screening     Status: None   Collection Time: 04/01/19 10:58 AM   Specimen: Nasal Mucosa; Nasopharyngeal  Result Value Ref Range Status   MRSA by PCR NEGATIVE NEGATIVE Final    Comment:        The GeneXpert MRSA Assay (FDA approved for NASAL specimens only), is one component of a comprehensive MRSA colonization surveillance program. It is not intended to diagnose MRSA infection nor to guide or monitor treatment for MRSA infections. Performed at [facility], [address] [place], [place] [address]   Culture, respiratory (non-expectorated)     Status: None   Collection Time: 04/01/19 12:50 PM   Specimen: Tracheal Aspirate; Respiratory  Result Value Ref Range Status   Specimen Description   Final    TRACHEAL ASPIRATE Performed at [facility], [address] [place],  [address]    Special Requests   Final    NONE Performed at [facility], [address] [place],  [address]    Gram Stain   Final  MODERATE WBC PRESENT, PREDOMINANTLY PMN FEW SQUAMOUS EPITHELIAL CELLS PRESENT ABUNDANT GRAM POSITIVE COCCI RARE GRAM POSITIVE RODS Performed at [facility], [place] [address] [place], [place] [address]    Culture FEW METHICILLIN RESISTANT STAPHYLOCOCCUS AUREUS  Final   Report Status 04/04/2019 FINAL  Final   Organism ID, Bacteria METHICILLIN RESISTANT STAPHYLOCOCCUS AUREUS  Final       Susceptibility   Methicillin resistant staphylococcus aureus - MIC*    CIPROFLOXACIN <=0.5 SENSITIVE Sensitive     ERYTHROMYCIN <=0.25 SENSITIVE Sensitive     GENTAMICIN <=0.5 SENSITIVE Sensitive     OXACILLIN >=4 RESISTANT Resistant     TETRACYCLINE <=1 SENSITIVE Sensitive     VANCOMYCIN <=0.5 SENSITIVE Sensitive     TRIMETH/SULFA <=10 SENSITIVE Sensitive     CLINDAMYCIN <=0.25 SENSITIVE Sensitive     RIFAMPIN <=0.5 SENSITIVE Sensitive     Inducible Clindamycin NEGATIVE Sensitive     * FEW METHICILLIN RESISTANT STAPHYLOCOCCUS AUREUS  C difficile quick scan w PCR reflex     Status: None   Collection Time: 04/04/19  9:07 PM   Specimen: STOOL  Result Value Ref Range Status   C Diff antigen NEGATIVE NEGATIVE Final   C Diff toxin NEGATIVE NEGATIVE Final   C Diff interpretation No C. difficile detected.  Final    Comment: Performed at [facility], [address] [place], [place] [address]    Coagulation Studies: No results for input(s): LABPROT, INR in the last 72 hours.  Urinalysis: No results for input(s): COLORURINE, LABSPEC, PHURINE, GLUCOSEU, HGBUR, BILIRUBINUR, KETONESUR, PROTEINUR, UROBILINOGEN, NITRITE, LEUKOCYTESUR in the last 72 hours.  Invalid input(s): APPERANCEUR    Imaging: No results found.   Medications:   . dexmedetomidine (PRECEDEX) IV infusion Stopped (04/05/19 1046)  . levETIRAcetam Stopped (04/06/19 1826)  . vancomycin     . amLODipine  10 mg Oral Daily  . chlorhexidine gluconate (MEDLINE KIT)  15 mL Mouth Rinse BID  . Chlorhexidine Gluconate Cloth  6 each Topical Daily  . epoetin (EPOGEN/PROCRIT) injection  10,000 Units Intravenous Q T,Th,Sa-HD  . feeding supplement (NEPRO CARB STEADY)  237 mL Oral BID BM  . fentaNYL      . heparin      . insulin aspart  0-15 Units Subcutaneous Q4H  . insulin aspart  3 Units Subcutaneous TID WC  . insulin glargine  30 Units Subcutaneous BID  . insulin starter kit- syringes  1 kit Other Once  .  labetalol  100 mg Oral BID  . lidocaine-EPINEPHrine      . midazolam      . multivitamin  1 tablet Oral QHS  . pantoprazole sodium  40 mg Per Tube Daily  . sodium chloride flush  10-40 mL Intracatheter Q12H  . sodium chloride flush  3 mL Intravenous Q12H  . vancomycin variable dose per unstable renal function (pharmacist dosing)   Does not apply See admin instructions   acetaminophen **OR** acetaminophen, albuterol, heparin, hydrALAZINE, labetalol, ondansetron **OR** ondansetron (ZOFRAN) IV, polyethylene glycol, sodium chloride flush  Assessment/ Plan:    Mr. Roy Oconnell is a 43 y.o. black male with diabetes mellitus type 2, hypertension, hyperlipidemia, prior history of kidney donation, chronic kidney disease stage III baseline creatinine 1.5 on August 27, 2018, who was admitted to [facility] on 04/01/2019 for evaluation of seizures and loss of consciousness.  1.  Acute renal failure on chronic kidney disease stage III baseline creatinine 1.5 08/27/2018 with history of renal donation.  Chronic kidney disease  stage III with proteinuria. Secondary to solitary kidney, hypertension and diabetic nephropathy Requiring renal replacement therapy.  CRRT stopped on 6/15. Intermittent hemodialysis on 6/16 and 6/18. Percath placed on on 6/18.  Anuric.  - Monitor daily for dialysis need - With patient's underlying solitary kidney and diabetic nephropathy and prolonged period of anuria. Suspect patient has progressed to ESRD.   2.  Hypertension: elevated on treatment. Currently on amlodipine, labetalol - Start irbesartan in the morning, Will also consider diuretic.   3.  Anemia with chronic kidney disease:   - EPO with HD treatment TTS  4. Seizures: -  levitiracetam.    5.  Diabetes mellitus type II with chronic kidney disease: noninsulin dependent prior to admission. Hemoglobin A1c of 14.8% - uncontrolled. Patient reports he was not able to afford his medications.  Due to poor diabetic control,  suspect renal recovery to be less likely.    LOS: 6 Emiliana Blaize 6/18/20204:25 PM

## 2019-04-07 NOTE — Progress Notes (Signed)
Pre HD Tx   04/07/19 1403  Hand-Off documentation  Report given to (Full Name) Ruthy Dick, RN   Report received from (Full Name) Beatris Ship, Rn   Vital Signs  Temp 97.8 F (36.6 C)  Temp Source Oral  Pulse Rate (!) 104  Pulse Rate Source Monitor  Resp 18  BP (!) 185/91  BP Location Right Arm  BP Method Automatic  Patient Position (if appropriate) Lying  Oxygen Therapy  SpO2 100 %  O2 Device Nasal Cannula  O2 Flow Rate (L/min) 2 L/min  Pulse Oximetry Type Continuous  Pain Assessment  Pain Scale 0-10  Pain Score 0  Dialysis Weight  Weight (!) 141.5 kg  Type of Weight Pre-Dialysis  Time-Out for Hemodialysis  What Procedure? HD  Pt Identifiers(min of two) First/Last Name;MRN/Account#  Correct Site? Yes  Correct Side? Yes  Correct Procedure? Yes  Consents Verified? Yes  Rad Studies Available? Yes  Safety Precautions Reviewed? Yes  Engineer, civil (consulting) Number 4  Station Number 2  UF/Alarm Test Passed  Conductivity: Meter 13.8  Conductivity: Machine  13.7  pH 7.2  Reverse Osmosis Main  Normal Saline Lot Number [id number]  Dialyzer Lot Number [id number]  Disposable Set Lot Number [id number]  Machine Temperature 98.6 F (37 C)  Musician and Audible Yes  Blood Lines Intact and Secured Yes  Pre Treatment Patient Checks  Vascular access used during treatment Catheter  HD catheter dressing before treatment Not occlusive (Just Placed today, will change in 24hrs per protocol)  Hepatitis B Surface Antigen Results Negative  Date Hepatitis B Surface Antigen Drawn 04/05/19  Hepatitis B Surface Antibody  (<10)  Date Hepatitis B Surface Antibody Drawn 04/05/19  Hemodialysis Consent Verified Yes  Hemodialysis Standing Orders Initiated Yes  ECG (Telemetry) Monitor On Yes  Prime Ordered Normal Saline  Length of  DialysisTreatment -hour(s) 3 Hour(s)  Dialysis Treatment Comments Na 140  Dialyzer Elisio 17H NR  Dialysate 2.5 Ca;2K  Dialysis Anticoagulant None   Dialysate Flow Ordered 600  Blood Flow Rate Ordered 400 mL/min  Ultrafiltration Goal 1.5 Liters  Dialysis Blood Pressure Support Ordered Normal Saline  Education / Care Plan  Dialysis Education Provided Yes  Documented Education in Care Plan Yes  Hemodialysis Catheter Left Internal jugular Triple lumen Temporary (Non-Tunneled)  Placement Date/Time: 04/02/19 1100   Placed prior to admission: No  Maximum sterile barrier precautions: Hand hygiene;Cap;Mask;Sterile gown;Sterile gloves;Large sterile sheet  Site Prep: Chlorhexidine (preferred);Skin Prep Completely Dry at the Time o...  Site Condition No complications  Post treatment catheter status Capped and Clamped  Hemodialysis Catheter Right Subclavian Double lumen Permanent (Tunneled)  Placement Date/Time: 04/07/19 1043   Time Out: Correct patient;Correct site;Correct procedure  Maximum sterile barrier precautions: Hand hygiene;Cap;Mask;Sterile gown;Sterile gloves;Large sterile sheet  Site Prep: Chlorhexidine (preferred)  Local Anes...  Site Condition No complications  Blue Lumen Status Blood return noted  Red Lumen Status Blood return noted  Purple Lumen Status N/A  Dressing Type Gauze/Drain sponge  Dressing Status Clean;Dry;Intact  Drainage Description None  Dressing Change Due 04/08/19

## 2019-04-08 ENCOUNTER — Encounter: Payer: Self-pay | Admitting: Vascular Surgery

## 2019-04-08 LAB — RENAL FUNCTION PANEL
Albumin: 2.9 g/dL — ABNORMAL LOW (ref 3.5–5.0)
Anion gap: 19 — ABNORMAL HIGH (ref 5–15)
BUN: 69 mg/dL — ABNORMAL HIGH (ref 6–20)
CO2: 21 mmol/L — ABNORMAL LOW (ref 22–32)
Calcium: 8.2 mg/dL — ABNORMAL LOW (ref 8.9–10.3)
Chloride: 95 mmol/L — ABNORMAL LOW (ref 98–111)
Creatinine, Ser: 10.35 mg/dL — ABNORMAL HIGH (ref 0.61–1.24)
GFR calc Af Amer: 6 mL/min — ABNORMAL LOW (ref >60)
GFR calc non Af Amer: 6 mL/min — ABNORMAL LOW (ref >60)
Glucose, Bld: 228 mg/dL — ABNORMAL HIGH (ref 70–99)
Phosphorus: 7.2 mg/dL — ABNORMAL HIGH (ref 2.5–4.6)
Potassium: 3.3 mmol/L — ABNORMAL LOW (ref 3.5–5.1)
Sodium: 135 mmol/L (ref 135–145)

## 2019-04-08 LAB — GLUCOSE, CAPILLARY
Glucose-Capillary: 146 mg/dL — ABNORMAL HIGH (ref 70–99)
Glucose-Capillary: 168 mg/dL — ABNORMAL HIGH (ref 70–99)
Glucose-Capillary: 183 mg/dL — ABNORMAL HIGH (ref 70–99)
Glucose-Capillary: 194 mg/dL — ABNORMAL HIGH (ref 70–99)
Glucose-Capillary: 207 mg/dL — ABNORMAL HIGH (ref 70–99)
Glucose-Capillary: 216 mg/dL — ABNORMAL HIGH (ref 70–99)

## 2019-04-08 MED ORDER — HYDROCODONE-ACETAMINOPHEN 5-325 MG PO TABS
1.0000 | ORAL_TABLET | Freq: Once | ORAL | Status: AC
  Administered 2019-04-08: 1 via ORAL
  Filled 2019-04-08: qty 1

## 2019-04-08 MED ORDER — HYDROMORPHONE HCL 1 MG/ML IJ SOLN
0.5000 mg | Freq: Once | INTRAMUSCULAR | Status: AC
  Administered 2019-04-08: 0.5 mg via INTRAVENOUS
  Filled 2019-04-08: qty 0.5

## 2019-04-08 MED ORDER — CYCLOBENZAPRINE HCL 5 MG PO TABS
7.5000 mg | ORAL_TABLET | Freq: Three times a day (TID) | ORAL | Status: DC | PRN
  Administered 2019-04-08 – 2019-04-09 (×3): 7.5 mg via ORAL
  Filled 2019-04-08 (×4): qty 1.5

## 2019-04-08 MED ORDER — LEVETIRACETAM ER 500 MG PO TB24
500.0000 mg | ORAL_TABLET | Freq: Every day | ORAL | Status: DC
  Administered 2019-04-08 – 2019-04-14 (×7): 500 mg via ORAL
  Filled 2019-04-08 (×9): qty 1

## 2019-04-08 NOTE — Progress Notes (Addendum)
Paged Gardiner Barefoot regarding patients pain. He reported 7/10. Was asked to order a 1 time dose of 0.5mg  dilauded.  This provided patient with relief.

## 2019-04-08 NOTE — Progress Notes (Signed)
04/08/2019 7:37 PM  Patient requested we raise all 4 rails on his bed.  I complied as that was his request but made clear that he was not in any way being confined and that we will drop the bed rails at his request any time.  Dola Argyle, RN

## 2019-04-08 NOTE — Progress Notes (Signed)
[facility]  ROUNDING NOTE   Subjective:   Bleeding around his permcath insertion site.   Hemodialysis treatment   Objective:  Vital signs in last 24 hours:  Temp:  [97.8 F (36.6 C)-99.3 F (37.4 C)] 99.3 F (37.4 C) (06/19 0318) Pulse Rate:  [97-110] 100 (06/19 0318) Resp:  [12-28] 21 (06/19 0318) BP: (157-201)/(82-104) 157/82 (06/19 0318) SpO2:  [97 %-100 %] 97 % (06/19 0318) Weight:  [138.5 kg-141.5 kg] 139.6 kg (06/19 0500)  Weight change: 0 kg Filed Weights   04/07/19 1403 04/07/19 1715 04/08/19 0500  Weight: (!) 141.5 kg (!) 138.5 kg (!) 139.6 kg    Intake/Output: I/O last 3 completed shifts: In: 70 [P.O.:290; IV Piggyback:100] Out: 0    Intake/Output this shift:  Total I/O In: 240 [P.O.:240] Out: 1 [Stool:1]  Physical Exam: General: Laying in bed  Head: Moist mucosal membranes  Eyes: Anicteric  Neck: trachea midline  Lungs:  Clear   Heart: regular  Abdomen:  Soft, obese, +bowel sounds  Extremities: No peripheral edema.  Neurologic: Alert and oriented.   Skin: No lesions  Access:  RIJ permcath 6/18. Dressing bloody    Basic Metabolic Panel: Recent Labs  Lab 04/03/19 2016 04/04/19 0002 04/04/19 0430 04/04/19 0750 04/04/19 1208 04/04/19 1538 04/05/19 0434 04/06/19 0517 04/07/19 0408 04/08/19 1147  NA 131* 134* 136 136  --   --  137 138 140 135  K 4.0 3.8 3.2* 3.9  --   --  3.8 3.6 3.5 3.3*  CL 98 102 102 102  --   --  103 101 101 95*  CO2 21* _0 --   --  23 19* 20* 21*  GLUCOSE 264* 269* 206* 280*  --   --  251* 255* 246* 228*  BUN 31* 35* 32* 36*  --   --  55* 66* 86* 69*  CREATININE 4.86* 5.16* 4.68* 4.98*  --   --  7.71* 8.76* 11.49* 10.35*  CALCIUM 7.8* 8.2* 8.2* 8.4*  --   --  8.1* 8.3* 8.2* 8.2*  MG 2.0 2.1 2.1 2.1 2.1 2.0 2.0  --  2.0  --   PHOS 3.6 3.5 3.2 3.7  --   --   --   --   --  7.2*    Liver Function Tests: Recent Labs  Lab 04/02/19 0730  04/03/19 2016 04/04/19 0002 04/04/19 0430 04/04/19 0750  04/08/19 1147  AST 32  --   --   --   --   --   --   ALT 29  --   --   --   --   --   --   ALKPHOS 68  --   --   --   --   --   --   BILITOT 0.9  --   --   --   --   --   --   PROT 6.2*  --   --   --   --   --   --   ALBUMIN 3.4*   < > 3.1* 2.9* 2.9* 2.9* 2.9*   < > = values in this interval not displayed.   No results for input(s): LIPASE, AMYLASE in the last 168 hours. No results for input(s): AMMONIA in the last 168 hours.  CBC: Recent Labs  Lab 04/02/19 0730 04/03/19 0524 04/04/19 0430 04/05/19 0434 04/05/19 2320  WBC 9.0 8.5 8.9 9.9 15.4*  NEUTROABS  --   --   --  6.5  --   HGB 10.9* 9.5* 9.5* 9.0* 9.7*  HCT 35.8* 29.8* 29.7* 27.9* 29.7*  MCV 100.8* 99.0 94.6 94.3 92.0  PLT 232 163 145* 161 207    Cardiac Enzymes: Recent Labs  Lab 04/01/19 1601 04/01/19 2155 04/02/19 2029 04/03/19 0524 04/03/19 0738 04/03/19 1624  CKTOTAL  --   --   --   --   --  894*  TROPONINI 0.72* 0.93* 0.38* 0.42* 0.46*  --     BNP: Invalid input(s): POCBNP  CBG: Recent Labs  Lab 04/07/19 2019 04/07/19 2343 04/08/19 0348 04/08/19 0840 04/08/19 1151  GLUCAP 151* 168* 207* 194* 216*    Microbiology: Results for orders placed or performed during the hospital encounter of 04/01/19  Culture, blood (routine x 2)     Status: None   Collection Time: 04/01/19  5:30 AM   Specimen: BLOOD  Result Value Ref Range Status   Specimen Description BLOOD BLOOD LEFT HAND  Final   Special Requests   Final    BOTTLES DRAWN AEROBIC AND ANAEROBIC Blood Culture results may not be optimal due to an inadequate volume of blood received in culture bottles   Culture   Final    NO GROWTH 5 DAYS Performed at [facility], [address], [place], [place] [address]    Report Status 04/06/2019 FINAL  Final  Urine culture     Status: None   Collection Time: 04/01/19  5:30 AM   Specimen: Urine, Random  Result Value Ref Range Status   Specimen Description   Final    URINE, RANDOM Performed  at [facility], [address], [place], [place] [address]    Special Requests   Final    NONE Performed at [facility], [address], [place], [place] [address]    Culture   Final    NO GROWTH Performed at [facility], [place] [address] [place], [place] [address]    Report Status 04/02/2019 FINAL  Final  SARS Coronavirus 2 (CEPHEID - Performed in [place] hospital lab), Hosp Order     Status: None   Collection Time: 04/01/19  5:30 AM   Specimen: Nasopharyngeal Swab  Result Value Ref Range Status   SARS Coronavirus 2 NEGATIVE NEGATIVE Final    Comment: (NOTE) If result is NEGATIVE SARS-CoV-2 target nucleic acids are NOT DETECTED. The SARS-CoV-2 RNA is generally detectable in upper and lower  respiratory specimens during the acute phase of infection. The lowest  concentration of SARS-CoV-2 viral copies this assay can detect is 250  copies / mL. A negative result does not preclude SARS-CoV-2 infection  and should not be used as the sole basis for treatment or other  patient management decisions.  A negative result may occur with  improper specimen collection / handling, submission of specimen other  than nasopharyngeal swab, presence of viral mutation(s) within the  areas targeted by this assay, and inadequate number of viral copies  (<250 copies / mL). A negative result must be combined with clinical  observations, patient history, and epidemiological information. If result is POSITIVE SARS-CoV-2 target nucleic acids are DETECTED. The SARS-CoV-2 RNA is generally detectable in upper and lower  respiratory specimens dur ing the acute phase of infection.  Positive  results are indicative of active infection with SARS-CoV-2.  Clinical  correlation with patient history and other diagnostic information is  necessary to determine patient infection status.  Positive results do  not rule out bacterial infection  or co-infection with other viruses. If result  is PRESUMPTIVE POSTIVE SARS-CoV-2 nucleic acids MAY BE PRESENT.   A presumptive positive result was obtained on the submitted specimen  and confirmed on repeat testing.  While 2019 novel coronavirus  (SARS-CoV-2) nucleic acids may be present in the submitted sample  additional confirmatory testing may be necessary for epidemiological  and / or clinical management purposes  to differentiate between  SARS-CoV-2 and other Sarbecovirus currently known to infect humans.  If clinically indicated additional testing with an alternate test  methodology [phone]) is advised. The SARS-CoV-2 RNA is generally  detectable in upper and lower respiratory sp ecimens during the acute  phase of infection. The expected result is Negative. Fact Sheet for Patients:  StrictlyIdeas.no Fact Sheet for Healthcare Providers: BankingDealers.co.za This test is not yet approved or cleared by the [place] FDA and has been authorized for detection and/or diagnosis of SARS-CoV-2 by FDA under an Emergency Use Authorization (EUA).  This EUA will remain in effect (meaning this test can be used) for the duration of the COVID-19 declaration under Section 564(b)(1) of the Act, 21 U.S.C. section 360bbb-3(b)(1), unless the authorization is terminated or revoked sooner. Performed at [facility], [address] [place], [place] [address]   Culture, blood (routine x 2)     Status: None   Collection Time: 04/01/19  6:49 AM   Specimen: BLOOD  Result Value Ref Range Status   Specimen Description BLOOD BLOOD RIGHT HAND  Final   Special Requests   Final    BOTTLES DRAWN AEROBIC AND ANAEROBIC Blood Culture results may not be optimal due to an excessive volume of blood received in culture bottles   Culture   Final    NO GROWTH 5 DAYS Performed at [facility], [address] [place], [place] [address]    Report Status 04/06/2019 FINAL  Final  MRSA PCR  Screening     Status: None   Collection Time: 04/01/19 10:58 AM   Specimen: Nasal Mucosa; Nasopharyngeal  Result Value Ref Range Status   MRSA by PCR NEGATIVE NEGATIVE Final    Comment:        The GeneXpert MRSA Assay (FDA approved for NASAL specimens only), is one component of a comprehensive MRSA colonization surveillance program. It is not intended to diagnose MRSA infection nor to guide or monitor treatment for MRSA infections. Performed at [facility], [address] [place], [place] [address]   Culture, respiratory (non-expectorated)     Status: None   Collection Time: 04/01/19 12:50 PM   Specimen: Tracheal Aspirate; Respiratory  Result Value Ref Range Status   Specimen Description   Final    TRACHEAL ASPIRATE Performed at [facility], [address] [place], [place] [address]    Special Requests   Final    NONE Performed at [facility], [address] [place], [place] [address]    Gram Stain   Final    MODERATE WBC PRESENT, PREDOMINANTLY PMN FEW SQUAMOUS EPITHELIAL CELLS PRESENT ABUNDANT GRAM POSITIVE COCCI RARE GRAM POSITIVE RODS Performed at [facility], [place] [address] [place], [place] [address]    Culture FEW METHICILLIN RESISTANT STAPHYLOCOCCUS AUREUS  Final   Report Status 04/04/2019 FINAL  Final   Organism ID, Bacteria METHICILLIN RESISTANT STAPHYLOCOCCUS AUREUS  Final      Susceptibility   Methicillin resistant staphylococcus aureus - MIC*    CIPROFLOXACIN <=0.5 SENSITIVE Sensitive     ERYTHROMYCIN <=0.25 SENSITIVE Sensitive  GENTAMICIN <=0.5 SENSITIVE Sensitive     OXACILLIN >=4 RESISTANT Resistant     TETRACYCLINE <=1 SENSITIVE Sensitive     VANCOMYCIN <=0.5 SENSITIVE Sensitive     TRIMETH/SULFA <=10 SENSITIVE Sensitive     CLINDAMYCIN <=0.25 SENSITIVE Sensitive     RIFAMPIN <=0.5 SENSITIVE Sensitive     Inducible Clindamycin NEGATIVE Sensitive     * FEW METHICILLIN RESISTANT STAPHYLOCOCCUS AUREUS  C  difficile quick scan w PCR reflex     Status: None   Collection Time: 04/04/19  9:07 PM   Specimen: STOOL  Result Value Ref Range Status   C Diff antigen NEGATIVE NEGATIVE Final   C Diff toxin NEGATIVE NEGATIVE Final   C Diff interpretation No C. difficile detected.  Final    Comment: Performed at [facility] [place] [facility], [address] [place], [place] [address]    Coagulation Studies: No results for input(s): LABPROT, INR in the last 72 hours.  Urinalysis: No results for input(s): COLORURINE, LABSPEC, PHURINE, GLUCOSEU, HGBUR, BILIRUBINUR, KETONESUR, PROTEINUR, UROBILINOGEN, NITRITE, LEUKOCYTESUR in the last 72 hours.  Invalid input(s): APPERANCEUR    Imaging: No results found.   Medications:   . dexmedetomidine (PRECEDEX) IV infusion Stopped (04/05/19 1046)  . levETIRAcetam 1,000 mg (04/07/19 1813)   . amLODipine  10 mg Oral Daily  . chlorhexidine gluconate (MEDLINE KIT)  15 mL Mouth Rinse BID  . Chlorhexidine Gluconate Cloth  6 each Topical Daily  . epoetin (EPOGEN/PROCRIT) injection  10,000 Units Intravenous Q T,Th,Sa-HD  . feeding supplement (NEPRO CARB STEADY)  237 mL Oral BID BM  . insulin aspart  0-15 Units Subcutaneous Q4H  . insulin aspart  3 Units Subcutaneous TID WC  . insulin glargine  30 Units Subcutaneous BID  . insulin starter kit- syringes  1 kit Other Once  . irbesartan  300 mg Oral Daily  . labetalol  100 mg Oral BID  . multivitamin  1 tablet Oral QHS  . pantoprazole sodium  40 mg Per Tube Daily  . sodium chloride flush  10-40 mL Intracatheter Q12H  . sodium chloride flush  3 mL Intravenous Q12H  . vancomycin variable dose per unstable renal function (pharmacist dosing)   Does not apply See admin instructions   acetaminophen **OR** acetaminophen, albuterol, cyclobenzaprine, diphenhydrAMINE, famotidine, heparin, hydrALAZINE, HYDROmorphone (DILAUDID) injection, labetalol, methylPREDNISolone (SOLU-MEDROL) injection, midazolam, ondansetron **OR**  ondansetron (ZOFRAN) IV, ondansetron (ZOFRAN) IV, polyethylene glycol, sodium chloride flush  Assessment/ Plan:    Roy Oconnell is a 43 y.o. black male with diabetes mellitus type 2, hypertension, hyperlipidemia, prior history of kidney donation, chronic kidney disease stage III baseline creatinine 1.5 on August 27, 2018, who was admitted to [facility] on 04/01/2019 for evaluation of seizures and loss of consciousness.  1.  Acute renal failure on chronic kidney disease stage III baseline creatinine 1.5 08/27/2018 with history of renal donation.  Chronic kidney disease stage III with proteinuria. Secondary to solitary kidney, hypertension and diabetic nephropathy Requiring renal replacement therapy.  CRRT stopped on 6/15. Intermittent hemodialysis on 6/16 and 6/18. Percath placed on on 6/18.  Patient has started to make some urine. Start stict I&Os.  - Monitor daily for dialysis need. No indication for dialysis today.  - With patient's underlying solitary kidney and diabetic nephropathy and prolonged period of anuria. Suspect patient has progressed to ESRD.   2.  Hypertension: elevated 157/82. Currently on amlodipine, labetalol - Started irbesartan    3.  Anemia with chronic kidney disease:   - EPO with  HD treatment TTS  4. Seizures: -  levitiracetam.    5.  Diabetes mellitus type II with chronic kidney disease: noninsulin dependent prior to admission. Hemoglobin A1c of 14.8% - uncontrolled. Patient reports he was not able to afford his medications.  Due to poor diabetic control, suspect renal recovery to be less likely.    LOS: 7 Sumiye Hirth 6/19/20201:13 PM

## 2019-04-08 NOTE — Progress Notes (Addendum)
Inpatient Diabetes Program Recommendations  AACE/ADA: New Consensus Statement on Inpatient Glycemic Control (2015)  Target Ranges:  Prepandial:   less than 140 mg/dL      Peak postprandial:   less than 180 mg/dL (1-2 hours)      Critically ill patients:  140 - 180 mg/dL    Results for ZACHARI, ALBERTA (MRN [id number]) as of 04/08/2019 14:28  Ref. Range 04/07/2019 23:43 04/08/2019 03:48 04/08/2019 08:40 04/08/2019 11:51  Glucose-Capillary Latest Ref Range: 70 - 99 mg/dL 168 (H) 207 (H) 194 (H) 216 (H)     Admit New Onset Seizures/ DKA/ Acute Kidney Injury/ Possible Sepsis (Glucose 739, CO2 9, AG 30)  History:DM2   Home DM Meds: Glipizide 10 mg BID  Current Orders:Lantus 30 units BID Novolog Moderate Correction Scale/ SSI (0-15 units)Q4 hours                            Novolog 3 units TID with meals      MD- Please consider the following in-hospital insulin adjustments:  1. Stop Novolog 3 units TID (meal coverage) for now--Pt not eating well per documentation  2. Increase Lantus to 35 units BID    Care Management team has been consulted given pt without PCP nor Insurance.  Met with pt yesterday and briefly discussed DM care with pt.  Met with pt again today and pt much more A&O and able to have meaningful conversation.    Discussed A1C results with patient and explained what an A1C is, basic pathophysiology of DM Type 2, basic home care, basic diabetes diet nutrition principles, importance of checking CBGs and maintaining good CBG control to prevent long-term and short-term complications.  Reviewed signs and symptoms of hyperglycemia and hypoglycemia and how to treat both conditions at home.  Also reviewed blood sugar goals and A1c goals for home.  Also discussed with pt that there are two ways to give insulin at home (pen versus vial and syringe).  Briefly dicussed both and told pt that I would like for him to read through the insulin teaching  pamphlet to become more familiar with both methods.  RNs are currently working with pt on learning injections.  RNs to provide ongoing basic DM education at bedside with this patient.  Have ordered educational booklet, insulin starter kit.  Have asked RNs to make sure pt is practicing all insulin injections.  Have also placed RD consult for DM diet education for this patient.  Discussed with patient that given his current A1c and current kidney function, Insulin is likely the best medication option for him at home.  Pt open to taking insulin at home but concerned about the cost.  Discussed with pt that [facility] Team is working on finding outpatient care for him and that we will keep cost considerations in mind when deciding upon medications for discharge.     --Will follow patient during hospitalization--  Wyn Quaker RN, MSN, CDE Diabetes Coordinator Inpatient Glycemic Control Team Team Pager: [phone] (8a-5p)

## 2019-04-08 NOTE — Progress Notes (Addendum)
Left IJ removed. Sutures removed, catheter intact, pressure applied for 15 minutes and blood stopped. Vaseline gauze, gauze and tegaderm applied. Will continue to monitor.

## 2019-04-08 NOTE — Progress Notes (Signed)
[place] [facility] [place] NAME: Roy Oconnell    MR#:  [id number]  DATE OF BIRTH:  [birth year]  SUBJECTIVE:  CHIEF COMPLAINT:   Chief Complaint  Patient presents with  . Seizures  . Loss of Consciousness   Extubated and awake. Complains of some back pain.  REVIEW OF SYSTEMS:  Review of Systems  Unable to perform ROS: Intubated    DRUG ALLERGIES:  No Known Allergies VITALS:  Blood pressure (!) 157/82, pulse 100, temperature 99.3 F (37.4 C), temperature source Oral, resp. rate (!) 21, height 5\' 10"  (1.778 m), weight (!) 139.6 kg, SpO2 97 %. PHYSICAL EXAMINATION:  Physical Exam Constitutional:      Comments: Morbid obesity.  On ventilation.  HENT:     Head: Normocephalic.  Eyes:     General: No scleral icterus.    Conjunctiva/sclera: Conjunctivae normal.     Pupils: Pupils are equal, round, and reactive to light.  Neck:     Musculoskeletal: Neck supple.     Vascular: No JVD.     Trachea: No tracheal deviation.  Cardiovascular:     Rate and Rhythm: Normal rate and regular rhythm.     Heart sounds: Normal heart sounds. No murmur. No gallop.   Pulmonary:     Effort: Pulmonary effort is normal. No respiratory distress.     Breath sounds: Normal breath sounds. No wheezing or rales.  Abdominal:     General: Bowel sounds are normal. There is no distension.     Palpations: Abdomen is soft.     Tenderness: There is no abdominal tenderness. There is no rebound.  Musculoskeletal:        General: No tenderness.  Skin:    Findings: No erythema or rash.    LABORATORY PANEL:  Male CBC Recent Labs  Lab 04/05/19 2320  WBC 15.4*  HGB 9.7*  HCT 29.7*  PLT 207   ------------------------------------------------------------------------------------------------------------------ Chemistries  Recent Labs  Lab 04/02/19 0730  04/07/19 0408 04/08/19 1147  NA 138   < > 140 135  K 4.8   < > 3.5 3.3*  CL 104   < > 101 95*  CO2 22    < > 20* 21*  GLUCOSE 322*   < > 246* 228*  BUN 28*   < > 86* 69*  CREATININE 4.64*   < > 11.49* 10.35*  CALCIUM 8.0*   < > 8.2* 8.2*  MG  --    < > 2.0  --   AST 32  --   --   --   ALT 29  --   --   --   ALKPHOS 68  --   --   --   BILITOT 0.9  --   --   --    < > = values in this interval not displayed.   RADIOLOGY:  No results found. ASSESSMENT AND PLAN:   * Acute kidney injury on CKD 3 secondary to DKA, ATN and sepsis. Patient was treated with IV fluid, but renal function has been worsened.   He was on CRRT.  Now on hemodialysis. Appreciate nephrology input.  * DKA, hyperglycemia and diabetes Improved with insulin drip and IV fluid support.  Anion gap close to normal. Continue sliding scale and Lantus. Increased lantus  * New onset seizures On keppra  * Sepsis shock and aspiration pneumonia  IV Zosyn.  Blood cultures NGTD.  * Elevated troponin.  Possible due to demanding ischemia  secondary to above.  * Acute metabolic encephalopathy due to seizures and DKA Improving  *Acute respiratory failure with hypoxia and hypercapnia due aspiration PNA Resolved  All the records are reviewed and case discussed with Care. Management/Social Worker. Management plans discussed with the patient, family and they are in agreement.  CODE STATUS: Full Code  TOTAL TIME TAKING CARE OF THIS PATIENT: 35 minutes.   [name] M.D on 04/08/2019 at 1:15 PM  Between 7am to 6pm - Pager - [phone]  After 6pm go to [email or web address] - [facility]

## 2019-04-08 NOTE — TOC Initial Note (Signed)
Transition of Care [facility]) - Initial/Assessment Note    Patient Details  Name: Roy Oconnell MRN: [id number] Date of Birth: November 12, 1975  Transition of Care [facility]) CM/SW Contact:    Beverly Sessions, RN Phone Number: 04/08/2019, 4:15 PM  Clinical Narrative:                 Patient admitted with Acute kidney injury on CKD 3 secondary to DKA, ATN and sepsis  Patient states that he lives at home with a roommate Currently Unemployed, and has been living off savings  Patient states he does not have transportation of his own, but has reliable people he can count on for a ride.   Patient will require outpatient HD at discharge.  Elvera Bicker HD liaison aware.  PCP [place].  Last seen 08/28/19. Patient requests for follow up appointment to be made.  Appointment made at the clinic for 04/18/19.  Patient notified  Patient provided with CM glucometer donated kit.   RNCM following for medication needs at discharge   Expected Discharge Plan: Home/Self Care Barriers to Discharge: Continued Medical Work up   Patient Goals and CMS Choice        Expected Discharge Plan and Services Expected Discharge Plan: Home/Self Care   Discharge Planning Services: CM Consult, Medication Assistance, [place] Acute Care Choice: Dialysis                                        Prior Living Arrangements/Services   Lives with:: Roommate Patient language and need for interpreter reviewed:: Yes Do you feel safe going back to the place where you live?: Yes            Criminal Activity/Legal Involvement Pertinent to Current Situation/Hospitalization: No - Comment as needed  Activities of Daily Living Home Assistive Devices/Equipment: CBG Meter, Cane (specify quad or straight) ADL Screening (condition at time of admission) Patient's cognitive ability adequate to safely complete daily activities?: Yes Is the patient deaf or have difficulty hearing?: No Does the patient  have difficulty seeing, even when wearing glasses/contacts?: No Does the patient have difficulty concentrating, remembering, or making decisions?: No Patient able to express need for assistance with ADLs?: Yes Does the patient have difficulty dressing or bathing?: No Independently performs ADLs?: Yes (appropriate for developmental age) Does the patient have difficulty walking or climbing stairs?: No Weakness of Legs: Both Weakness of Arms/Hands: None  Permission Sought/Granted                  Emotional Assessment Appearance:: Appears stated age Attitude/Demeanor/Rapport: Gracious Affect (typically observed): Accepting Orientation: : Oriented to Self, Oriented to Place, Oriented to  Time, Oriented to Situation Alcohol / Substance Use: Never Used Psych Involvement: No (comment)  Admission diagnosis:  Hyperglycemia [R73.9] Unresponsive [R41.89] Acute respiratory failure with hypoxia ([place]) [J96.01] Seizure-like activity (HCC) [R56.9] Diabetic ketoacidosis with coma associated with type 2 diabetes mellitus ([place]) [E11.11] Aspiration pneumonia, unspecified aspiration pneumonia type, unspecified laterality, unspecified part of lung ([place]) [J69.0] Community acquired pneumonia, unspecified laterality [J18.9] Sepsis, due to unspecified organism, unspecified whether acute organ dysfunction present [facility]) [A41.9] Patient Active Problem List   Diagnosis Date Noted  . DKA (diabetic ketoacidoses) ([place]) 04/01/2019  . Acute respiratory failure with hypoxia ([place])   . Sepsis ([place])    PCP:  Gennaro Africa, MD Pharmacy:  No Pharmacies Listed    Social Determinants  of Health (SDOH) Interventions    Readmission Risk Interventions No flowsheet data found.  

## 2019-04-08 NOTE — Progress Notes (Signed)
[place] at [place] NAME: Roy Oconnell    MR#:  [id number]  DATE OF BIRTH:  18-Oct-1976  SUBJECTIVE:  CHIEF COMPLAINT:   Chief Complaint  Patient presents with  . Seizures  . Loss of Consciousness   Doing well. Patient with back spasms Hemodialysis yesterday.  REVIEW OF SYSTEMS:  Review of Systems  Unable to perform ROS: Intubated    DRUG ALLERGIES:  No Known Allergies VITALS:  Blood pressure (!) 157/82, pulse 100, temperature 99.3 F (37.4 C), temperature source Oral, resp. rate (!) 21, height 5\' 10"  (1.778 m), weight (!) 139.6 kg, SpO2 97 %. PHYSICAL EXAMINATION:  Physical Exam Constitutional:      Comments: Morbid obesity.  On ventilation.  HENT:     Head: Normocephalic.  Eyes:     General: No scleral icterus.    Conjunctiva/sclera: Conjunctivae normal.     Pupils: Pupils are equal, round, and reactive to light.  Neck:     Musculoskeletal: Neck supple.     Vascular: No JVD.     Trachea: No tracheal deviation.  Cardiovascular:     Rate and Rhythm: Normal rate and regular rhythm.     Heart sounds: Normal heart sounds. No murmur. No gallop.   Pulmonary:     Effort: Pulmonary effort is normal. No respiratory distress.     Breath sounds: Normal breath sounds. No wheezing or rales.  Abdominal:     General: Bowel sounds are normal. There is no distension.     Palpations: Abdomen is soft.     Tenderness: There is no abdominal tenderness. There is no rebound.  Musculoskeletal:        General: No tenderness.  Skin:    Findings: No erythema or rash.    LABORATORY PANEL:  Male CBC Recent Labs  Lab 04/05/19 2320  WBC 15.4*  HGB 9.7*  HCT 29.7*  PLT 207   ------------------------------------------------------------------------------------------------------------------ Chemistries  Recent Labs  Lab 04/02/19 0730  04/07/19 0408 04/08/19 1147  NA 138   < > 140 135  K 4.8   < > 3.5 3.3*  CL 104   < > 101 95*   CO2 22   < > 20* 21*  GLUCOSE 322*   < > 246* 228*  BUN 28*   < > 86* 69*  CREATININE 4.64*   < > 11.49* 10.35*  CALCIUM 8.0*   < > 8.2* 8.2*  MG  --    < > 2.0  --   AST 32  --   --   --   ALT 29  --   --   --   ALKPHOS 68  --   --   --   BILITOT 0.9  --   --   --    < > = values in this interval not displayed.   RADIOLOGY:  No results found. ASSESSMENT AND PLAN:   * Acute kidney injury on CKD 3 secondary to DKA, ATN and sepsis. Patient was treated with IV fluid, but renal function has been worsened.   He was on CRRT.  Now on hemodialysis. Appreciate nephrology input. Will need further inpatient dialysis prior to definitive plan  * DKA, hyperglycemia and diabetes Improved with insulin drip and IV fluid support.  Anion gap close to normal. Continue sliding scale and Lantus. Increased lantus  * New onset seizures On keppra  * Sepsis shock and aspiration pneumonia  IV Zosyn.  Blood cultures NGTD.  *  Elevated troponin.  Possible due to demanding ischemia secondary to above.  * Acute metabolic encephalopathy due to seizures and DKA Improving  *Acute respiratory failure with hypoxia and hypercapnia due aspiration PNA Resolved  All the records are reviewed and case discussed with Care. Management/Social Worker. Management plans discussed with the patient, family and they are in agreement.  CODE STATUS: Full Code  TOTAL TIME TAKING CARE OF THIS PATIENT: 35 minutes.   Leia Alf Bailyn Spackman M.D on 04/08/2019 at 1:20 PM  Between 7am to 6pm - Pager - [phone]  After 6pm go to [email or web address] - [facility]

## 2019-04-08 NOTE — Progress Notes (Signed)
Patient declined CPAP at this time

## 2019-04-08 NOTE — Progress Notes (Signed)
Speech Therapy note: reviewed chart notes; consulted NSG then met w/ pt in room. Pt and NSG denied any difficulty swallowing; he is tolerating a regular diet w/ cut meats, and swallowing pills w/ water w/ NSG. No decline in pulmonary status per NSG.  As pt appears at his baseline re: swallowing. He does c/o reduced appetite and reduced desire for foods; noted Nepro in room. Recommend continued f/u w/ Dietician for support. NSG to reconsult if any decline in status while admitted.     Orinda Kenner, [place], CCC-SLP

## 2019-04-09 LAB — BASIC METABOLIC PANEL
Anion gap: 18 — ABNORMAL HIGH (ref 5–15)
BUN: 82 mg/dL — ABNORMAL HIGH (ref 6–20)
CO2: 20 mmol/L — ABNORMAL LOW (ref 22–32)
Calcium: 8.1 mg/dL — ABNORMAL LOW (ref 8.9–10.3)
Chloride: 97 mmol/L — ABNORMAL LOW (ref 98–111)
Creatinine, Ser: 13.36 mg/dL — ABNORMAL HIGH (ref 0.61–1.24)
GFR calc Af Amer: 5 mL/min — ABNORMAL LOW (ref >60)
GFR calc non Af Amer: 4 mL/min — ABNORMAL LOW (ref >60)
Glucose, Bld: 193 mg/dL — ABNORMAL HIGH (ref 70–99)
Potassium: 3.7 mmol/L (ref 3.5–5.1)
Sodium: 135 mmol/L (ref 135–145)

## 2019-04-09 LAB — GLUCOSE, CAPILLARY
Glucose-Capillary: 125 mg/dL — ABNORMAL HIGH (ref 70–99)
Glucose-Capillary: 111 mg/dL — ABNORMAL HIGH (ref 70–99)
Glucose-Capillary: 153 mg/dL — ABNORMAL HIGH (ref 70–99)
Glucose-Capillary: 80 mg/dL (ref 70–99)
Glucose-Capillary: 93 mg/dL (ref 70–99)

## 2019-04-09 MED ORDER — AMITRIPTYLINE HCL 25 MG PO TABS
25.0000 mg | ORAL_TABLET | Freq: Every day | ORAL | Status: DC
  Administered 2019-04-09 – 2019-04-14 (×6): 25 mg via ORAL
  Filled 2019-04-09 (×7): qty 1

## 2019-04-09 MED ORDER — GABAPENTIN 100 MG PO CAPS
100.0000 mg | ORAL_CAPSULE | Freq: Every day | ORAL | Status: DC
  Administered 2019-04-09 – 2019-04-14 (×6): 100 mg via ORAL
  Filled 2019-04-09 (×6): qty 1

## 2019-04-09 MED ORDER — CYCLOBENZAPRINE HCL 10 MG PO TABS
10.0000 mg | ORAL_TABLET | Freq: Three times a day (TID) | ORAL | Status: DC | PRN
  Administered 2019-04-09 – 2019-04-14 (×7): 10 mg via ORAL
  Filled 2019-04-09 (×7): qty 1

## 2019-04-09 MED ORDER — ACETAMINOPHEN 325 MG PO TABS
650.0000 mg | ORAL_TABLET | Freq: Four times a day (QID) | ORAL | Status: DC | PRN
  Administered 2019-04-09 – 2019-04-14 (×7): 650 mg via ORAL
  Filled 2019-04-09 (×8): qty 2

## 2019-04-09 NOTE — Progress Notes (Signed)
[facility]  ROUNDING NOTE   Subjective:   Seen and examined on hemodialysis treatment. Tolerating treatment well.   Minimal urine output  Patient states it is too painful to sit in chair   Objective:  Vital signs in last 24 hours:  Temp:  [97.6 F (36.4 C)-98.4 F (36.9 C)] 98.4 F (36.9 C) (06/20 1200) Pulse Rate:  [89-100] 94 (06/20 1230) Resp:  [18-26] 26 (06/20 1230) BP: (134-188)/(66-95) 134/66 (06/20 1230) SpO2:  [96 %-100 %] 98 % (06/20 1230) Weight:  [139.8 kg] 139.8 kg (06/20 0500)  Weight change: -1.7 kg Filed Weights   04/07/19 1715 04/08/19 0500 04/09/19 0500  Weight: (!) 138.5 kg (!) 139.6 kg (!) 139.8 kg    Intake/Output: I/O last 3 completed shifts: In: 530 [P.O.:530] Out: 1 [Stool:1]   Intake/Output this shift:  Total I/O In: 120 [P.O.:120] Out: -   Physical Exam: General: Laying in bed  Head: Moist mucosal membranes  Eyes: Anicteric  Neck: trachea midline  Lungs:  Clear   Heart: regular  Abdomen:  Soft, obese, +bowel sounds  Extremities: No peripheral edema.  Neurologic: Alert and oriented.   Skin: No lesions  Access:  RIJ permcath 6/18.      Basic Metabolic Panel: Recent Labs  Lab 04/03/19 2016 04/04/19 0002 04/04/19 0430 04/04/19 0750 04/04/19 1208 04/04/19 1538 04/05/19 0434 04/06/19 0517 04/07/19 0408 04/08/19 1147 04/09/19 0946  NA 131* 134* 136 136  --   --  137 138 140 135 135  K 4.0 3.8 3.2* 3.9  --   --  3.8 3.6 3.5 3.3* 3.7  CL 98 102 102 102  --   --  103 101 101 95* 97*  CO2 21* 23 23 22  --   --  23 19* 20* 21* 20*  GLUCOSE 264* 269* 206* 280*  --   --  251* 255* 246* 228* 193*  BUN 31* 35* 32* 36*  --   --  55* 66* 86* 69* 82*  CREATININE 4.86* 5.16* 4.68* 4.98*  --   --  7.71* 8.76* 11.49* 10.35* 13.36*  CALCIUM 7.8* 8.2* 8.2* 8.4*  --   --  8.1* 8.3* 8.2* 8.2* 8.1*  MG 2.0 2.1 2.1 2.1 2.1 2.0 2.0  --  2.0  --   --   PHOS 3.6 3.5 3.2 3.7  --   --   --   --   --  7.2*  --     Liver Function  Tests: Recent Labs  Lab 04/03/19 2016 04/04/19 0002 04/04/19 0430 04/04/19 0750 04/08/19 1147  ALBUMIN 3.1* 2.9* 2.9* 2.9* 2.9*   No results for input(s): LIPASE, AMYLASE in the last 168 hours. No results for input(s): AMMONIA in the last 168 hours.  CBC: Recent Labs  Lab 04/03/19 0524 04/04/19 0430 04/05/19 0434 04/05/19 2320  WBC 8.5 8.9 9.9 15.4*  NEUTROABS  --   --  6.5  --   HGB 9.5* 9.5* 9.0* 9.7*  HCT 29.8* 29.7* 27.9* 29.7*  MCV 99.0 94.6 94.3 92.0  PLT 163 145* 161 207    Cardiac Enzymes: Recent Labs  Lab 04/02/19 2029 04/03/19 0524 04/03/19 0738 04/03/19 1624  CKTOTAL  --   --   --  894*  TROPONINI 0.38* 0.42* 0.46*  --     BNP: Invalid input(s): POCBNP  CBG: Recent Labs  Lab 04/08/19 1618 04/08/19 2038 04/09/19 0058 04/09/19 0331 04/09/19 0758  GLUCAP 183* 146* 125* 111* 153*    Microbiology: Results   for orders placed or performed during the hospital encounter of 04/01/19  Culture, blood (routine x 2)     Status: None   Collection Time: 04/01/19  5:30 AM   Specimen: BLOOD  Result Value Ref Range Status   Specimen Description BLOOD BLOOD LEFT HAND  Final   Special Requests   Final    BOTTLES DRAWN AEROBIC AND ANAEROBIC Blood Culture results may not be optimal due to an inadequate volume of blood received in culture bottles   Culture   Final    NO GROWTH 5 DAYS Performed at [facility], [address] [place], [place] [address]    Report Status 04/06/2019 FINAL  Final  Urine culture     Status: None   Collection Time: 04/01/19  5:30 AM   Specimen: Urine, Random  Result Value Ref Range Status   Specimen Description   Final    URINE, RANDOM Performed at [facility], [address] [place], [place] [address]    Special Requests   Final    NONE Performed at [facility], [address] , [place] [address]    Culture   Final    NO GROWTH Performed at [facility], [address]  [place], [place] [address]    Report Status 04/02/2019 FINAL  Final  SARS Coronavirus 2 (CEPHEID - Performed in [place] hospital lab), Hosp Order     Status: None   Collection Time: 04/01/19  5:30 AM   Specimen: Nasopharyngeal Swab  Result Value Ref Range Status   SARS Coronavirus 2 NEGATIVE NEGATIVE Final    Comment: (NOTE) If result is NEGATIVE SARS-CoV-2 target nucleic acids are NOT DETECTED. The SARS-CoV-2 RNA is generally detectable in upper and lower  respiratory specimens during the acute phase of infection. The lowest  concentration of SARS-CoV-2 viral copies this assay can detect is 250  copies / mL. A negative result does not preclude SARS-CoV-2 infection  and should not be used as the sole basis for treatment or other  patient management decisions.  A negative result may occur with  improper specimen collection / handling, submission of specimen other  than nasopharyngeal swab, presence of viral mutation(s) within the  areas targeted by this assay, and inadequate number of viral copies  (<250 copies / mL). A negative result must be combined with clinical  observations, patient history, and epidemiological information. If result is POSITIVE SARS-CoV-2 target nucleic acids are DETECTED. The SARS-CoV-2 RNA is generally detectable in upper and lower  respiratory specimens dur ing the acute phase of infection.  Positive  results are indicative of active infection with SARS-CoV-2.  Clinical  correlation with patient history and other diagnostic information is  necessary to determine patient infection status.  Positive results do  not rule out bacterial infection or co-infection with other viruses. If result is PRESUMPTIVE POSTIVE SARS-CoV-2 nucleic acids MAY BE PRESENT.   A presumptive positive result was obtained on the submitted specimen  and confirmed on repeat testing.  While 2019 novel coronavirus  (SARS-CoV-2) nucleic acids may be present in the submitted sample   additional confirmatory testing may be necessary for epidemiological  and / or clinical management purposes  to differentiate between  SARS-CoV-2 and other Sarbecovirus currently known to infect humans.  If clinically indicated additional testing with an alternate test  methodology ([id number]) is advised. The SARS-CoV-2 RNA is generally  detectable in upper and lower respiratory sp ecimens during the acute  phase of   infection. The expected result is Negative. Fact Sheet for Patients:  https://www.fda.gov/media/136312/download Fact Sheet for Healthcare Providers: https://www.fda.gov/media/136313/download This test is not yet approved or cleared by the [place] FDA and has been authorized for detection and/or diagnosis of SARS-CoV-2 by FDA under an Emergency Use Authorization (EUA).  This EUA will remain in effect (meaning this test can be used) for the duration of the COVID-19 declaration under Section 564(b)(1) of the Act, 21 U.S.C. section 360bbb-3(b)(1), unless the authorization is terminated or revoked sooner. Performed at [facility], [address] [place], [place] [address]   Culture, blood (routine x 2)     Status: None   Collection Time: 04/01/19  6:49 AM   Specimen: BLOOD  Result Value Ref Range Status   Specimen Description BLOOD BLOOD RIGHT HAND  Final   Special Requests   Final    BOTTLES DRAWN AEROBIC AND ANAEROBIC Blood Culture results may not be optimal due to an excessive volume of blood received in culture bottles   Culture   Final    NO GROWTH 5 DAYS Performed at [facility], [address] [place], [place] [address]    Report Status 04/06/2019 FINAL  Final  MRSA PCR Screening     Status: None   Collection Time: 04/01/19 10:58 AM   Specimen: Nasal Mucosa; Nasopharyngeal  Result Value Ref Range Status   MRSA by PCR NEGATIVE NEGATIVE Final    Comment:        The GeneXpert MRSA Assay (FDA approved for NASAL specimens only), is one  component of a comprehensive MRSA colonization surveillance program. It is not intended to diagnose MRSA infection nor to guide or monitor treatment for MRSA infections. Performed at [facility], [address] [place], [place] [address]   Culture, respiratory (non-expectorated)     Status: None   Collection Time: 04/01/19 12:50 PM   Specimen: Tracheal Aspirate; Respiratory  Result Value Ref Range Status   Specimen Description   Final    TRACHEAL ASPIRATE Performed at [facility], [address] [place], [place] [address]    Special Requests   Final    NONE Performed at [facility], [address] [place], [place] [address]    Gram Stain   Final    MODERATE WBC PRESENT, PREDOMINANTLY PMN FEW SQUAMOUS EPITHELIAL CELLS PRESENT ABUNDANT GRAM POSITIVE COCCI RARE GRAM POSITIVE RODS Performed at [facility], [address] [place],  [address]    Culture FEW METHICILLIN RESISTANT STAPHYLOCOCCUS AUREUS  Final   Report Status 04/04/2019 FINAL  Final   Organism ID, Bacteria METHICILLIN RESISTANT STAPHYLOCOCCUS AUREUS  Final      Susceptibility   Methicillin resistant staphylococcus aureus - MIC*    CIPROFLOXACIN <=0.5 SENSITIVE Sensitive     ERYTHROMYCIN <=0.25 SENSITIVE Sensitive     GENTAMICIN <=0.5 SENSITIVE Sensitive     OXACILLIN >=4 RESISTANT Resistant     TETRACYCLINE <=1 SENSITIVE Sensitive     VANCOMYCIN <=0.5 SENSITIVE Sensitive     TRIMETH/SULFA <=10 SENSITIVE Sensitive     CLINDAMYCIN <=0.25 SENSITIVE Sensitive     RIFAMPIN <=0.5 SENSITIVE Sensitive     Inducible Clindamycin NEGATIVE Sensitive     * FEW METHICILLIN RESISTANT STAPHYLOCOCCUS AUREUS  C difficile quick scan w PCR reflex     Status: None   Collection Time: 04/04/19  9:07 PM   Specimen: STOOL  Result Value Ref Range Status   C Diff antigen NEGATIVE NEGATIVE Final     C Diff toxin NEGATIVE NEGATIVE Final   C Diff interpretation No C. difficile detected.   Final    Comment: Performed at [facility], [address] [place], [place] [address]    Coagulation Studies: No results for input(s): LABPROT, INR in the last 72 hours.  Urinalysis: No results for input(s): COLORURINE, LABSPEC, PHURINE, GLUCOSEU, HGBUR, BILIRUBINUR, KETONESUR, PROTEINUR, UROBILINOGEN, NITRITE, LEUKOCYTESUR in the last 72 hours.  Invalid input(s): APPERANCEUR    Imaging: No results found.   Medications:    . amitriptyline  25 mg Oral QHS  . amLODipine  10 mg Oral Daily  . chlorhexidine gluconate (MEDLINE KIT)  15 mL Mouth Rinse BID  . Chlorhexidine Gluconate Cloth  6 each Topical Daily  . epoetin (EPOGEN/PROCRIT) injection  10,000 Units Intravenous Q T,Th,Sa-HD  . feeding supplement (NEPRO CARB STEADY)  237 mL Oral BID BM  . gabapentin  100 mg Oral QHS  . insulin aspart  0-15 Units Subcutaneous Q4H  . insulin glargine  30 Units Subcutaneous BID  . insulin starter kit- syringes  1 kit Other Once  . irbesartan  300 mg Oral Daily  . labetalol  100 mg Oral BID  . levETIRAcetam  500 mg Oral q1800  . multivitamin  1 tablet Oral QHS  . sodium chloride flush  3 mL Intravenous Q12H   acetaminophen, albuterol, cyclobenzaprine, diphenhydrAMINE, famotidine, heparin, hydrALAZINE, HYDROmorphone (DILAUDID) injection, labetalol, methylPREDNISolone (SOLU-MEDROL) injection, midazolam, ondansetron (ZOFRAN) IV, ondansetron **OR** [DISCONTINUED] ondansetron (ZOFRAN) IV, polyethylene glycol  Assessment/ Plan:    Roy Oconnell is a 43 y.o. black male with diabetes mellitus type 2, hypertension, hyperlipidemia, prior history of kidney donation, chronic kidney disease stage III baseline creatinine 1.5 on August 27, 2018, who was admitted to [facility] on 04/01/2019 for evaluation of seizures and loss of consciousness.  1.  Acute renal failure on chronic kidney disease stage III baseline creatinine 1.5 08/27/2018 with history of renal donation.  Chronic kidney disease  stage III with proteinuria. Secondary to solitary kidney, hypertension and diabetic nephropathy Requiring renal replacement therapy.  CRRT stopped on 6/15. Intermittent hemodialysis on 6/16, 6/18, 6/20. Permcath placed on on 6/18.  Currently anuric - Monitor daily for dialysis need. - Dialysis today - With patient's underlying solitary kidney and diabetic nephropathy and prolonged period of anuria. Suspect patient has progressed to ESRD.   2.  Hypertension: elevated 134/66. Currently on irbesartan, amlodipine, labetalol  3.  Anemia with chronic kidney disease:   - EPO with HD treatment TTS  4. Seizures: -  levitiracetam.    5.  Diabetes mellitus type II with chronic kidney disease: noninsulin dependent prior to admission. Hemoglobin A1c of 14.8% - uncontrolled. Patient reports he was not able to afford his medications.  Due to poor diabetic control, suspect renal recovery to be less likely.    LOS: 8 Sarath Kolluru 6/20/202012:39 PM   

## 2019-04-09 NOTE — Progress Notes (Signed)
Machine check

## 2019-04-09 NOTE — Progress Notes (Addendum)
[place] at [place] NAME: Roy Oconnell    MR#:  [id number]  DATE OF BIRTH:  Jan 09, 1976  SUBJECTIVE:  Complains of muscular spasms only, case discussed with nephrology-trying to avoid further hemodialysis at this point  REVIEW OF SYSTEMS:  Review of Systems  Unable to perform ROS: Intubated    DRUG ALLERGIES:  No Known Allergies VITALS:  Blood pressure (!) 181/95, pulse 92, temperature 98.2 F (36.8 C), temperature source Oral, resp. rate 18, height 5\' 10"  (1.778 m), weight (!) 139.8 kg, SpO2 96 %. PHYSICAL EXAMINATION:  Physical Exam Constitutional:      Comments: Morbid obesity.  On ventilation.  HENT:     Head: Normocephalic.  Eyes:     General: No scleral icterus.    Conjunctiva/sclera: Conjunctivae normal.     Pupils: Pupils are equal, round, and reactive to light.  Neck:     Musculoskeletal: Neck supple.     Vascular: No JVD.     Trachea: No tracheal deviation.  Cardiovascular:     Rate and Rhythm: Normal rate and regular rhythm.     Heart sounds: Normal heart sounds. No murmur. No gallop.   Pulmonary:     Effort: Pulmonary effort is normal. No respiratory distress.     Breath sounds: Normal breath sounds. No wheezing or rales.  Abdominal:     General: Bowel sounds are normal. There is no distension.     Palpations: Abdomen is soft.     Tenderness: There is no abdominal tenderness. There is no rebound.  Musculoskeletal:        General: No tenderness.  Skin:    Findings: No erythema or rash.    LABORATORY PANEL:  Male CBC Recent Labs  Lab 04/05/19 2320  WBC 15.4*  HGB 9.7*  HCT 29.7*  PLT 207   ------------------------------------------------------------------------------------------------------------------ Chemistries  Recent Labs  Lab 04/07/19 0408  04/09/19 0946  NA 140   < > 135  K 3.5   < > 3.7  CL 101   < > 97*  CO2 20*   < > 20*  GLUCOSE 246*   < > 193*  BUN 86*   < > 82*  CREATININE  11.49*   < > 13.36*  CALCIUM 8.2*   < > 8.1*  MG 2.0  --   --    < > = values in this interval not displayed.   RADIOLOGY:  No results found. ASSESSMENT AND PLAN:  * Acute kidney injury on CKD 3 secondary to DKA, ATN and sepsis Noted continued worsening He was on CRRT, now HD -discussed with nephrology-attempting to avoid further dialysis at this time, status post permacath placement, nephrology input greatly appreciated, strict I&O monitoring, daily weights, BMP in the morning  * DKA, hyperglycemia chronic insulin-dependent diabetes mellitus  Resolved on current regiment   *New onset seizures Stable on keppra  *Acute sepsis shock and aspiration pneumonia Resolved Treated with course of IV Zosyn  *Acute elevated troponins  Likely secondary to demand ischemia/sepsis   * Acute metabolic encephalopathy  due to seizures, sepsis and DKA Resolved  *Acute respiratory failure with hypoxia and hypercapnia due aspiration PNA Resolved  DVT prophylaxis with heparin subcu GI prophylaxis-Pepcid Disposition pending clinical course  All the records are reviewed and case discussed with Care. Management/Social Worker. Management plans discussed with the patient, family and they are in agreement.  CODE STATUS: Full Code  TOTAL TIME TAKING CARE OF THIS PATIENT: 35 minutes.  Roy Oconnell M.D on 04/09/2019 at 11:56 AM  Between 7am to 6pm - Pager - [phone]  After 6pm go to [email or web address] - [facility]

## 2019-04-09 NOTE — Progress Notes (Signed)
PT Cancellation Note  Patient Details Name: Pavan Bring MRN: [id number] DOB: 1976-06-26   Cancelled Treatment:    Reason Eval/Treat Not Completed: Patient at procedure or test/unavailable.  Pt currently off floor at dialysis.  Will re-attempt PT evaluation at a later date/time as medically appropriate.  Leitha Bleak, PT 04/09/19, 1:03 PM [phone]

## 2019-04-10 LAB — BASIC METABOLIC PANEL
Anion gap: 17 — ABNORMAL HIGH (ref 5–15)
BUN: 58 mg/dL — ABNORMAL HIGH (ref 6–20)
CO2: 23 mmol/L (ref 22–32)
Calcium: 8.1 mg/dL — ABNORMAL LOW (ref 8.9–10.3)
Chloride: 94 mmol/L — ABNORMAL LOW (ref 98–111)
Creatinine, Ser: 10.41 mg/dL — ABNORMAL HIGH (ref 0.61–1.24)
GFR calc Af Amer: 6 mL/min — ABNORMAL LOW (ref >60)
GFR calc non Af Amer: 5 mL/min — ABNORMAL LOW (ref >60)
Glucose, Bld: 130 mg/dL — ABNORMAL HIGH (ref 70–99)
Potassium: 3.7 mmol/L (ref 3.5–5.1)
Sodium: 134 mmol/L — ABNORMAL LOW (ref 135–145)

## 2019-04-10 LAB — GLUCOSE, CAPILLARY
Glucose-Capillary: 116 mg/dL — ABNORMAL HIGH (ref 70–99)
Glucose-Capillary: 119 mg/dL — ABNORMAL HIGH (ref 70–99)
Glucose-Capillary: 123 mg/dL — ABNORMAL HIGH (ref 70–99)
Glucose-Capillary: 145 mg/dL — ABNORMAL HIGH (ref 70–99)
Glucose-Capillary: 170 mg/dL — ABNORMAL HIGH (ref 70–99)
Glucose-Capillary: 175 mg/dL — ABNORMAL HIGH (ref 70–99)

## 2019-04-10 LAB — PROTIME-INR
INR: 1.1 (ref 0.8–1.2)
Prothrombin Time: 14.5 seconds (ref 11.4–15.2)

## 2019-04-10 MED ORDER — LABETALOL HCL 100 MG PO TABS
100.0000 mg | ORAL_TABLET | Freq: Three times a day (TID) | ORAL | Status: DC
  Administered 2019-04-10 – 2019-04-11 (×3): 100 mg via ORAL
  Filled 2019-04-10 (×5): qty 1

## 2019-04-10 MED ORDER — POLYETHYLENE GLYCOL 3350 17 G PO PACK
17.0000 g | PACK | Freq: Every day | ORAL | Status: AC
  Administered 2019-04-10 – 2019-04-11 (×2): 17 g via ORAL
  Filled 2019-04-10 (×2): qty 1

## 2019-04-10 NOTE — Progress Notes (Addendum)
[facility] NAME: Roy Oconnell    MR#:  [id number]  DATE OF BIRTH:  March 28, 1976  SUBJECTIVE:  Complains of constipation only, nephrology input appreciated   REVIEW OF SYSTEMS:  Review of Systems  Unable to perform ROS: Intubated    DRUG ALLERGIES:  No Known Allergies VITALS:  Blood pressure (!) 158/77, pulse (!) 117, temperature 98.3 F (36.8 C), temperature source Oral, resp. rate 18, height 5\' 10"  (1.778 m), weight (!) 138.3 kg, SpO2 97 %. PHYSICAL EXAMINATION:  Physical Exam Constitutional:      Comments: Morbid obesity.  On ventilation.  HENT:     Head: Normocephalic.  Eyes:     General: No scleral icterus.    Conjunctiva/sclera: Conjunctivae normal.     Pupils: Pupils are equal, round, and reactive to light.  Neck:     Musculoskeletal: Neck supple.     Vascular: No JVD.     Trachea: No tracheal deviation.  Cardiovascular:     Rate and Rhythm: Normal rate and regular rhythm.     Heart sounds: Normal heart sounds. No murmur. No gallop.   Pulmonary:     Effort: Pulmonary effort is normal. No respiratory distress.     Breath sounds: Normal breath sounds. No wheezing or rales.  Abdominal:     General: Bowel sounds are normal. There is no distension.     Palpations: Abdomen is soft.     Tenderness: There is no abdominal tenderness. There is no rebound.  Musculoskeletal:        General: No tenderness.  Skin:    Findings: No erythema or rash.    LABORATORY PANEL:  Male CBC Recent Labs  Lab 04/05/19 2320  WBC 15.4*  HGB 9.7*  HCT 29.7*  PLT 207   ------------------------------------------------------------------------------------------------------------------ Chemistries  Recent Labs  Lab 04/07/19 0408  04/10/19 0437  NA 140   < > 134*  K 3.5   < > 3.7  CL 101   < > 94*  CO2 20*   < > 23  GLUCOSE 246*   < > 130*  BUN 86*   < > 58*  CREATININE 11.49*   < > 10.41*  CALCIUM 8.2*   < > 8.1*  MG  2.0  --   --    < > = values in this interval not displayed.   RADIOLOGY:  No results found. 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  Noted history of CKD 3 secondary to DKA, ATN, and sepsis Nephrology following for hemodialysis needs-had treatment on yesterday, s/p permacath placement, will need dialysis chair status post discharge  * DKA, hyperglycemia chronic insulin-dependent diabetes mellitus  Resolved on current regiment   *New onset seizures Stable on keppra  *Acute sepsis shock and aspiration pneumonia Resolved Treated with course of IV Zosyn  *Acute elevated troponins  Likely secondary to demand ischemia/sepsis   * Acute metabolic encephalopathy  due to seizures, sepsis and DKA Resolved  *Acute respiratory failure with hypoxia and hypercapnia due aspiration PNA Resolved  *Acute constipation MiraLAX daily for the next 2 days  DVT prophylaxis with heparin subcu GI prophylaxis-Pepcid Disposition Home once outpatient dialysis chair arranged for  All the records are reviewed and case discussed with Care. Management/Social Worker. Management plans discussed with the patient, family and they are in agreement.  CODE STATUS: Full Code  TOTAL TIME TAKING CARE OF THIS PATIENT: 35 minutes.   Nina Mondor D Braya Habermehl M.D  on 04/10/2019 at 12:48 PM  Between 7am to 6pm - Pager - [phone]  After 6pm go to [email or web address] - [facility]

## 2019-04-10 NOTE — Evaluation (Signed)
Physical Therapy Evaluation Patient Details Name: Roy Oconnell MRN: [id number] DOB: Mar 08, 1976 Today's Date: 04/10/2019   History of Present Illness  Patient is a 43 year old male admitted post seizure 6/16. SInce hospitalization patient has had perm cath placed and started hemodialysis. PMH DM2, HTN, hyperlipidemia, R kidney dontation, CKD III, baseline creatinine 1.5. Lives with roomate, completes all ADLs, does not have a car but does have reliable transportation, currently unemployeed d/t COVID; was a Herbalist.  Clinical Impression  MinA for bed mobility with more independence after cuing patient through technique and handrail use. Pt unable to stand with RW and max effort from normal height. With bed raises patient able to stand CGA with heavy cuing for proper technique and hand placement. Walks 8' in room with cuing for RW management with decent carry over, small shuffle steps. ModA stand>sit with decreased control despite cuing, d/t bilat knee pain. Patient with limitations in bilat knee pain, decreased strength, decreased activity tolerance, disallowing him to complete ambulation needed for household and community ADLs. Would benefit from skilled PT to address above deficits and promote optimal return to PLOF     Follow Up Recommendations Home health PT    Equipment Recommendations  Rolling walker with 5" wheels    Recommendations for Other Services       Precautions / Restrictions Precautions Precautions: Fall      Mobility  Bed Mobility Overal bed mobility: Needs Assistance Bed Mobility: Supine to Sit;Sit to Supine     Supine to sit: Min assist Sit to supine: Min assist   General bed mobility comments: MinA from PT for LE negotiation, use of bed rails for trunk, reports this is very tiring.  Transfers Overall transfer level: Needs assistance   Transfers: Sit to/from Stand Sit to Stand: From elevated surface;Min guard;Mod assist         General  transfer comment: From elevated surface CGA; from normal height, patient unable to stand with maxA d/t bilat knee pain. Patient unable to sit with control, maxA to control lowering stand>sit with cuing  Ambulation/Gait Ambulation/Gait assistance: Min guard Gait Distance (Feet): 8 Feet Assistive device: Rolling walker (2 wheeled) Gait Pattern/deviations: Shuffle Gait velocity: decreased   General Gait Details: cuing for RW safety  Stairs            Wheelchair Mobility    Modified Rankin (Stroke Patients Only)       Balance Overall balance assessment: Needs assistance Sitting-balance support: No upper extremity supported;Feet supported Sitting balance-Leahy Scale: Good       Standing balance-Leahy Scale: Fair                               Pertinent Vitals/Pain Pain Assessment: Faces Faces Pain Scale: Hurts whole lot Pain Location: bilat knees with stand > sit. Also reports L sided back spasms that are intermittent Pain Descriptors / Indicators: [place] expects to be discharged to:: Private residence Living Arrangements: Non-relatives/Friends Available Help at Discharge: Friend(s) Type of Home: Apartment Home Access: Stairs to enter Entrance Stairs-Rails: Right Entrance Stairs-Number of Steps: lives on second floor 12steps Home Layout: One level Home Equipment: None      Prior Function Level of Independence: Independent         Comments: Reports independence ambulating around home and in the community. Does report he has some bilat knee swelling/pain since he was 43 y/o that decreases how  long he can walk at a time     Hand Dominance   Dominant Hand: Right    Extremity/Trunk Assessment   Upper Extremity Assessment Upper Extremity Assessment: Overall WFL for tasks assessed    Lower Extremity Assessment Lower Extremity Assessment: Generalized weakness    Cervical / Trunk Assessment Cervical / Trunk  Assessment: Normal  Communication   Communication: No difficulties  Cognition Arousal/Alertness: Awake/alert Behavior During Therapy: WFL for tasks assessed/performed Overall Cognitive Status: Within Functional Limits for tasks assessed                                        General Comments      Exercises Other Exercises Other Exercises: Patietn is able to complete supine>sit with assistance with LE, more independence with upper body following cuing for hand placement and technique Other Exercises: STS: attempted stand from normal height surface with cuing for set up and RW safety with patient unable to complete with assistance from PT. WIth bed elevated patietn able to complete transfer without physical assistance, with use of LE at bed to assist, heavy reliance on UE, and gaurding for safety. Patient is able to take a few steps over and remain standing inside RW 64mins while PT brings chair behind patient. Despite cuing patient is unable to control descent to chair, with modA from PT required to control descent. Pt reports it is painful to his knees. Other Exercises: Amb: patietn able to amb 8 ft with cuing for RW management and patietn unable to increase foot clearance and step length dispite cuing.   Assessment/Plan    PT Assessment Patient needs continued PT services  PT Problem List Decreased range of motion;Decreased coordination;Decreased strength;Decreased mobility;Decreased activity tolerance;Decreased balance;Pain       PT Treatment Interventions DME instruction;Therapeutic activities;Gait training;Therapeutic exercise;Patient/family education;Stair training;Balance training;Functional mobility training;Neuromuscular re-education;Manual techniques    PT Goals (Current goals can be found in the Care Plan section)  Acute Rehab PT Goals Patient Stated Goal: Return to walking PT Goal Formulation: With patient Time For Goal Achievement: 04/24/19 Potential to  Achieve Goals: Fair    Frequency Min 2X/week   Barriers to discharge Decreased caregiver support      Co-evaluation               AM-PAC PT "6 Clicks" Mobility  Outcome Measure Help needed turning from your back to your side while in a flat bed without using bedrails?: None Help needed moving from lying on your back to sitting on the side of a flat bed without using bedrails?: A Little Help needed moving to and from a bed to a chair (including a wheelchair)?: A Little Help needed standing up from a chair using your arms (e.g., wheelchair or bedside chair)?: A Lot Help needed to walk in hospital room?: A Lot Help needed climbing 3-5 steps with a railing? : A Lot 6 Click Score: 16    End of Session Equipment Utilized During Treatment: Gait belt Activity Tolerance: Patient tolerated treatment well;Patient limited by pain;Patient limited by fatigue Patient left: in chair;with call bell/phone within reach;Other (comment)(Nursing notified patient not with chair alarm d/t PT pulling chair behind patient in room to sit d/t fatigue. Nursing agrees patient is oriented enough to understand to call nursing to get out of bed) Nurse Communication: Mobility status(linen change) PT Visit Diagnosis: Unsteadiness on feet (R26.81);Muscle weakness (generalized) (M62.81);Other abnormalities of gait  and mobility (R26.89);History of falling (Z91.81);Difficulty in walking, not elsewhere classified (R26.2);Pain Pain - Right/Left: (bilat) Pain - part of body: Knee    Time: 6148-3073 PT Time Calculation (min) (ACUTE ONLY): 24 min   Charges:   PT Evaluation $PT Eval Moderate Complexity: 1 Mod PT Treatments $Therapeutic Activity: 23-37 mins        Shelton Silvas PT, DPT  Shelton Silvas 04/10/2019, 4:31 PM

## 2019-04-10 NOTE — Progress Notes (Signed)
[facility]  ROUNDING NOTE   Subjective:   Hemodialysis treatment yesterday. Tolerated treatment in bed. UF 1.5 liters  Patient states it is too painful to sit in chair   Objective:  Vital signs in last 24 hours:  Temp:  [98.3 F (36.8 C)-100 F (37.8 C)] 98.3 F (36.8 C) (06/21 0514) Pulse Rate:  [96-117] 117 (06/21 0514) Resp:  [18-32] 18 (06/21 0514) BP: (117-158)/(53-77) 158/77 (06/21 0514) SpO2:  [97 %-100 %] 97 % (06/21 0514) Weight:  [138.3 kg] 138.3 kg (06/20 1509)  Weight change: -0.2 kg Filed Weights   04/09/19 0500 04/09/19 1155 04/09/19 1509  Weight: (!) 139.8 kg (!) 139.6 kg (!) 138.3 kg    Intake/Output: I/O last 3 completed shifts: In: 120 [P.O.:120] Out: 1500 [Other:1500]   Intake/Output this shift:  Total I/O In: 240 [P.O.:240] Out: -   Physical Exam: General: Laying in bed  Head: Moist mucosal membranes  Eyes: Anicteric  Neck: trachea midline  Lungs:  Clear   Heart: regular  Abdomen:  Soft, obese, +bowel sounds  Extremities: No peripheral edema.  Neurologic: Alert and oriented.   Skin: No lesions  Access:  RIJ permcath 6/18.      Basic Metabolic Panel: Recent Labs  Lab 04/03/19 2016 04/04/19 0002 04/04/19 0430 04/04/19 0750 04/04/19 1208 04/04/19 1538 04/05/19 0434 04/06/19 0517 04/07/19 0408 04/08/19 1147 04/09/19 0946 04/10/19 0437  NA 131* 134* 136 136  --   --  137 138 140 135 135 134*  K 4.0 3.8 3.2* 3.9  --   --  3.8 3.6 3.5 3.3* 3.7 3.7  CL 98 102 102 102  --   --  103 101 101 95* 97* 94*  CO2 21* 23 23 22   --   --  23 19* 20* 21* 20* 23  GLUCOSE 264* 269* 206* 280*  --   --  251* 255* 246* 228* 193* 130*  BUN 31* 35* 32* 36*  --   --  55* 66* 86* 69* 82* 58*  CREATININE 4.86* 5.16* 4.68* 4.98*  --   --  7.71* 8.76* 11.49* 10.35* 13.36* 10.41*  CALCIUM 7.8* 8.2* 8.2* 8.4*  --   --  8.1* 8.3* 8.2* 8.2* 8.1* 8.1*  MG 2.0 2.1 2.1 2.1 2.1 2.0 2.0  --  2.0  --   --   --   PHOS 3.6 3.5 3.2 3.7  --   --   --    --   --  7.2*  --   --     Liver Function Tests: Recent Labs  Lab 04/03/19 2016 04/04/19 0002 04/04/19 0430 04/04/19 0750 04/08/19 1147  ALBUMIN 3.1* 2.9* 2.9* 2.9* 2.9*   No results for input(s): LIPASE, AMYLASE in the last 168 hours. No results for input(s): AMMONIA in the last 168 hours.  CBC: Recent Labs  Lab 04/04/19 0430 04/05/19 0434 04/05/19 2320  WBC 8.9 9.9 15.4*  NEUTROABS  --  6.5  --   HGB 9.5* 9.0* 9.7*  HCT 29.7* 27.9* 29.7*  MCV 94.6 94.3 92.0  PLT 145* 161 207    Cardiac Enzymes: Recent Labs  Lab 04/03/19 1624  CKTOTAL 894*    BNP: Invalid input(s): POCBNP  CBG: Recent Labs  Lab 04/09/19 1538 04/09/19 2046 04/10/19 0023 04/10/19 0743 04/10/19 1214  GLUCAP 80 93 116* 123* 145*    Microbiology: Results for orders placed or performed during the hospital encounter of 04/01/19  Culture, blood (routine x 2)  Status: None   Collection Time: 04/01/19  5:30 AM   Specimen: BLOOD  Result Value Ref Range Status   Specimen Description BLOOD BLOOD LEFT HAND  Final   Special Requests   Final    BOTTLES DRAWN AEROBIC AND ANAEROBIC Blood Culture results may not be optimal due to an inadequate volume of blood received in culture bottles   Culture   Final    NO GROWTH 5 DAYS Performed at [facility], [address], [place], [place] [address]    Report Status 04/06/2019 FINAL  Final  Urine culture     Status: None   Collection Time: 04/01/19  5:30 AM   Specimen: Urine, Random  Result Value Ref Range Status   Specimen Description   Final    URINE, RANDOM Performed at [facility], [address] [place], [place] [address]    Special Requests   Final    NONE Performed at [facility], [address], [place], [place] [address]    Culture   Final    NO GROWTH Performed at [facility], [place] [address], [place], [place] [address]    Report Status 04/02/2019 FINAL  Final  SARS Coronavirus 2 (CEPHEID -  Performed in [place] hospital lab), Hosp Order     Status: None   Collection Time: 04/01/19  5:30 AM   Specimen: Nasopharyngeal Swab  Result Value Ref Range Status   SARS Coronavirus 2 NEGATIVE NEGATIVE Final    Comment: (NOTE) If result is NEGATIVE SARS-CoV-2 target nucleic acids are NOT DETECTED. The SARS-CoV-2 RNA is generally detectable in upper and lower  respiratory specimens during the acute phase of infection. The lowest  concentration of SARS-CoV-2 viral copies this assay can detect is 250  copies / mL. A negative result does not preclude SARS-CoV-2 infection  and should not be used as the sole basis for treatment or other  patient management decisions.  A negative result may occur with  improper specimen collection / handling, submission of specimen other  than nasopharyngeal swab, presence of viral mutation(s) within the  areas targeted by this assay, and inadequate number of viral copies  (<250 copies / mL). A negative result must be combined with clinical  observations, patient history, and epidemiological information. If result is POSITIVE SARS-CoV-2 target nucleic acids are DETECTED. The SARS-CoV-2 RNA is generally detectable in upper and lower  respiratory specimens dur ing the acute phase of infection.  Positive  results are indicative of active infection with SARS-CoV-2.  Clinical  correlation with patient history and other diagnostic information is  necessary to determine patient infection status.  Positive results do  not rule out bacterial infection or co-infection with other viruses. If result is PRESUMPTIVE POSTIVE SARS-CoV-2 nucleic acids MAY BE PRESENT.   A presumptive positive result was obtained on the submitted specimen  and confirmed on repeat testing.  While 2019 novel coronavirus  (SARS-CoV-2) nucleic acids may be present in the submitted sample  additional confirmatory testing may be necessary for epidemiological  and / or clinical management  purposes  to differentiate between  SARS-CoV-2 and other Sarbecovirus currently known to infect humans.  If clinically indicated additional testing with an alternate test  methodology [phone]) is advised. The SARS-CoV-2 RNA is generally  detectable in upper and lower respiratory sp ecimens during the acute  phase of infection. The expected result is Negative. Fact Sheet for Patients:  StrictlyIdeas.no Fact Sheet for Healthcare Providers: BankingDealers.co.za This test is  not yet approved or cleared by the [place] and has been authorized for detection and/or diagnosis of SARS-CoV-2 by FDA under an Emergency Use Authorization (EUA).  This EUA will remain in effect (meaning this test can be used) for the duration of the COVID-19 declaration under Section 564(b)(1) of the Act, 21 U.S.C. section 360bbb-3(b)(1), unless the authorization is terminated or revoked sooner. Performed at [facility], [address] [place], [place] [address]   Culture, blood (routine x 2)     Status: None   Collection Time: 04/01/19  6:49 AM   Specimen: BLOOD  Result Value Ref Range Status   Specimen Description BLOOD BLOOD RIGHT HAND  Final   Special Requests   Final    BOTTLES DRAWN AEROBIC AND ANAEROBIC Blood Culture results may not be optimal due to an excessive volume of blood received in culture bottles   Culture   Final    NO GROWTH 5 DAYS Performed at [facility], [address] [place], [place] [address]    Report Status 04/06/2019 FINAL  Final  MRSA PCR Screening     Status: None   Collection Time: 04/01/19 10:58 AM   Specimen: Nasal Mucosa; Nasopharyngeal  Result Value Ref Range Status   MRSA by PCR NEGATIVE NEGATIVE Final    Comment:        The GeneXpert MRSA Assay (FDA approved for NASAL specimens only), is one component of a comprehensive MRSA colonization surveillance program. It is not intended to diagnose  MRSA infection nor to guide or monitor treatment for MRSA infections. Performed at [facility], [address] [place], [place] [address]   Culture, respiratory (non-expectorated)     Status: None   Collection Time: 04/01/19 12:50 PM   Specimen: Tracheal Aspirate; Respiratory  Result Value Ref Range Status   Specimen Description   Final    TRACHEAL ASPIRATE Performed at [facility], [address] [place], [place] [address]    Special Requests   Final    NONE Performed at [facility], [address] [place], [place] [address]    Gram Stain   Final    MODERATE WBC PRESENT, PREDOMINANTLY PMN FEW SQUAMOUS EPITHELIAL CELLS PRESENT ABUNDANT GRAM POSITIVE COCCI RARE GRAM POSITIVE RODS Performed at [facility], [place] [address] [place], [place] [address]    Culture FEW METHICILLIN RESISTANT STAPHYLOCOCCUS AUREUS  Final   Report Status 04/04/2019 FINAL  Final   Organism ID, Bacteria METHICILLIN RESISTANT STAPHYLOCOCCUS AUREUS  Final      Susceptibility   Methicillin resistant staphylococcus aureus - MIC*    CIPROFLOXACIN <=0.5 SENSITIVE Sensitive     ERYTHROMYCIN <=0.25 SENSITIVE Sensitive     GENTAMICIN <=0.5 SENSITIVE Sensitive     OXACILLIN >=4 RESISTANT Resistant     TETRACYCLINE <=1 SENSITIVE Sensitive     VANCOMYCIN <=0.5 SENSITIVE Sensitive     TRIMETH/SULFA <=10 SENSITIVE Sensitive     CLINDAMYCIN <=0.25 SENSITIVE Sensitive     RIFAMPIN <=0.5 SENSITIVE Sensitive     Inducible Clindamycin NEGATIVE Sensitive     * FEW METHICILLIN RESISTANT STAPHYLOCOCCUS AUREUS  C difficile quick scan w PCR reflex     Status: None   Collection Time: 04/04/19  9:07 PM   Specimen: STOOL  Result Value Ref Range Status   C Diff antigen NEGATIVE NEGATIVE Final   C Diff toxin NEGATIVE NEGATIVE Final   C Diff interpretation No C. difficile detected.  Final    Comment:  Performed at [facility], [address] [place], [place] [address]     Coagulation Studies: No results for input(s): LABPROT, INR in the last 72 hours.  Urinalysis: No results for input(s): COLORURINE, LABSPEC, PHURINE, GLUCOSEU, HGBUR, BILIRUBINUR, KETONESUR, PROTEINUR, UROBILINOGEN, NITRITE, LEUKOCYTESUR in the last 72 hours.  Invalid input(s): APPERANCEUR    Imaging: No results found.   Medications:    . amitriptyline  25 mg Oral QHS  . amLODipine  10 mg Oral Daily  . chlorhexidine gluconate (MEDLINE KIT)  15 mL Mouth Rinse BID  . Chlorhexidine Gluconate Cloth  6 each Topical Daily  . epoetin (EPOGEN/PROCRIT) injection  10,000 Units Intravenous Q T,Th,Sa-HD  . feeding supplement (NEPRO CARB STEADY)  237 mL Oral BID BM  . gabapentin  100 mg Oral QHS  . insulin aspart  0-15 Units Subcutaneous Q4H  . insulin glargine  30 Units Subcutaneous BID  . insulin starter kit- syringes  1 kit Other Once  . irbesartan  300 mg Oral Daily  . labetalol  100 mg Oral TID  . levETIRAcetam  500 mg Oral q1800  . multivitamin  1 tablet Oral QHS  . polyethylene glycol  17 g Oral Daily  . sodium chloride flush  3 mL Intravenous Q12H   acetaminophen, albuterol, cyclobenzaprine, diphenhydrAMINE, famotidine, heparin, hydrALAZINE, HYDROmorphone (DILAUDID) injection, labetalol, methylPREDNISolone (SOLU-MEDROL) injection, midazolam, ondansetron (ZOFRAN) IV, ondansetron **OR** [DISCONTINUED] ondansetron (ZOFRAN) IV  Assessment/ Plan:    Mr. Roy Oconnell is a 43 y.o. black male with diabetes mellitus type 2, hypertension, hyperlipidemia, prior history of kidney donation, chronic kidney disease stage III baseline creatinine 1.5 on August 27, 2018, who was admitted to [facility] on 04/01/2019 for evaluation of seizures and loss of consciousness.  1.  Acute renal failure on chronic kidney disease stage III baseline creatinine 1.5 08/27/2018 with history of renal donation.  Suspect patient has progressed to ESRD.  Chronic kidney disease stage III with proteinuria. Secondary  to solitary kidney, hypertension and diabetic nephropathy Requiring renal replacement therapy.  CRRT stopped on 6/15. Intermittent hemodialysis on 6/16, 6/18, 6/20. Permcath placed on on 6/18.  Currently anuric - Monitor daily for dialysis need. - Start outpatient planning. Patient will need to sit in a chair  2.  Hypertension:. Currently on irbesartan, amlodipine, labetalol  3.  Anemia with chronic kidney disease:   - EPO with HD treatment TTS  4. Seizures: -  levitiracetam.    5.  Diabetes mellitus type II with chronic kidney disease: noninsulin dependent prior to admission. Hemoglobin A1c of 14.8% - uncontrolled. Patient reports he was not able to afford his medications.  Due to poor diabetic control, suspect renal recovery to be less likely.    LOS: 9 Roy Oconnell 6/21/20201:24 PM

## 2019-04-11 ENCOUNTER — Inpatient Hospital Stay: Payer: Self-pay

## 2019-04-11 LAB — BASIC METABOLIC PANEL
Anion gap: 20 — ABNORMAL HIGH (ref 5–15)
BUN: 75 mg/dL — ABNORMAL HIGH (ref 6–20)
CO2: 21 mmol/L — ABNORMAL LOW (ref 22–32)
Calcium: 8.4 mg/dL — ABNORMAL LOW (ref 8.9–10.3)
Chloride: 97 mmol/L — ABNORMAL LOW (ref 98–111)
Creatinine, Ser: 13.97 mg/dL — ABNORMAL HIGH (ref 0.61–1.24)
GFR calc Af Amer: 4 mL/min — ABNORMAL LOW (ref >60)
GFR calc non Af Amer: 4 mL/min — ABNORMAL LOW (ref >60)
Glucose, Bld: 84 mg/dL (ref 70–99)
Potassium: 3.9 mmol/L (ref 3.5–5.1)
Sodium: 138 mmol/L (ref 135–145)

## 2019-04-11 LAB — GLUCOSE, CAPILLARY
Glucose-Capillary: 80 mg/dL (ref 70–99)
Glucose-Capillary: 84 mg/dL (ref 70–99)
Glucose-Capillary: 75 mg/dL (ref 70–99)
Glucose-Capillary: 80 mg/dL (ref 70–99)
Glucose-Capillary: 122 mg/dL — ABNORMAL HIGH (ref 70–99)

## 2019-04-11 MED ORDER — INSULIN ASPART 100 UNIT/ML ~~LOC~~ SOLN
0.0000 [IU] | Freq: Three times a day (TID) | SUBCUTANEOUS | Status: DC
  Administered 2019-04-15: 3 [IU] via SUBCUTANEOUS
  Filled 2019-04-11: qty 1

## 2019-04-11 MED ORDER — INSULIN STARTER KIT- PEN NEEDLES (ENGLISH)
1.0000 | Freq: Once | Status: AC
  Administered 2019-04-11: 16:00:00 1
  Filled 2019-04-11: qty 1

## 2019-04-11 MED ORDER — INSULIN ASPART 100 UNIT/ML ~~LOC~~ SOLN: 0.0000 [IU] | Freq: Every day | SUBCUTANEOUS | Status: DC

## 2019-04-11 MED ORDER — INSULIN DETEMIR 100 UNIT/ML ~~LOC~~ SOLN
30.0000 [IU] | Freq: Two times a day (BID) | SUBCUTANEOUS | Status: DC
  Filled 2019-04-11 (×2): qty 0.3

## 2019-04-11 MED ORDER — INSULIN DETEMIR 100 UNIT/ML ~~LOC~~ SOLN
25.0000 [IU] | Freq: Two times a day (BID) | SUBCUTANEOUS | Status: DC
  Administered 2019-04-11: 22:00:00 25 [IU] via SUBCUTANEOUS
  Filled 2019-04-11 (×3): qty 0.25

## 2019-04-11 MED ORDER — INSULIN LISPRO 100 UNIT/ML ~~LOC~~ SOLN: 0.0000 [IU] | Freq: Three times a day (TID) | SUBCUTANEOUS | 11 refills | Status: DC

## 2019-04-11 MED ORDER — LEVEMIR FLEXTOUCH 100 UNIT/ML ~~LOC~~ SOPN: 30.0000 [IU] | PEN_INJECTOR | Freq: Two times a day (BID) | SUBCUTANEOUS | 11 refills | Status: DC

## 2019-04-11 MED ORDER — LABETALOL HCL 200 MG PO TABS
200.0000 mg | ORAL_TABLET | Freq: Two times a day (BID) | ORAL | Status: DC
  Administered 2019-04-11 – 2019-04-15 (×8): 200 mg via ORAL
  Filled 2019-04-11 (×9): qty 1

## 2019-04-11 NOTE — Progress Notes (Addendum)
[facility] [place] NAME: Roy Oconnell    MR#:  [id number]  DATE OF BIRTH:  Apr 28, 1976  SUBJECTIVE:  Patient without complaint, no events overnight, no urine per patient over last 24 hours, discussed with case management/social worker-we will need dialysis chair status post discharge    REVIEW OF SYSTEMS:  Review of Systems  Unable to perform ROS: Intubated    DRUG ALLERGIES:  No Known Allergies VITALS:  Blood pressure 140/82, pulse (!) 103, temperature 98.9 F (37.2 C), temperature source Oral, resp. rate 18, height 5\' 10"  (1.778 m), weight (!) 138.8 kg, SpO2 99 %. PHYSICAL EXAMINATION:  Physical Exam Constitutional:      Comments: Morbid obesity.  On ventilation.  HENT:     Head: Normocephalic.  Eyes:     General: No scleral icterus.    Conjunctiva/sclera: Conjunctivae normal.     Pupils: Pupils are equal, round, and reactive to light.  Neck:     Musculoskeletal: Neck supple.     Vascular: No JVD.     Trachea: No tracheal deviation.  Cardiovascular:     Rate and Rhythm: Normal rate and regular rhythm.     Heart sounds: Normal heart sounds. No murmur. No gallop.   Pulmonary:     Effort: Pulmonary effort is normal. No respiratory distress.     Breath sounds: Normal breath sounds. No wheezing or rales.  Abdominal:     General: Bowel sounds are normal. There is no distension.     Palpations: Abdomen is soft.     Tenderness: There is no abdominal tenderness. There is no rebound.  Musculoskeletal:        General: No tenderness.  Skin:    Findings: No erythema or rash.    LABORATORY PANEL:  Male CBC Recent Labs  Lab 04/05/19 2320  WBC 15.4*  HGB 9.7*  HCT 29.7*  PLT 207   ------------------------------------------------------------------------------------------------------------------ Chemistries  Recent Labs  Lab 04/07/19 0408  04/11/19 0641  NA 140   < > 138  K 3.5   < > 3.9  CL 101   < > 97*  CO2 20*    < > 21*  GLUCOSE 246*   < > 84  BUN 86*   < > 75*  CREATININE 11.49*   < > 13.97*  CALCIUM 8.2*   < > 8.4*  MG 2.0  --   --    < > = values in this interval not displayed.   RADIOLOGY:  No results found. ASSESSMENT AND PLAN:  * Acute new onset ESRD  Noted history of CKD 3 secondary to DKA, ATN, and sepsis Nephrology consulted for HD needs, s/p permacath placement, discussed with case management/social worker- will need dialysis chair s/p discharge  * DKA, hyperglycemia chronic insulin-dependent diabetes mellitus  Resolved on current regiment  Prescriptions for Levemir/Humalog printed for social work medication affordability  *New onset seizures Stable  Continue Keppra  *Acute sepsis shock and aspiration pneumonia Resolved Treated with course of IV Zosyn  *Acute elevated troponins  Likely secondary to demand ischemia/sepsis   * Acute metabolic encephalopathy  Resolved due to seizures, sepsis and DKA  *Acute respiratory failure with hypoxia and hypercapnia due aspiration PNA Resolved  *Acute constipation Resolved  Continue MiraLAX   DVT prophylaxis with heparin subcu GI prophylaxis-Pepcid Disposition Home once outpatient dialysis chair arranged for, 1-2 days  All the records are reviewed and case discussed with Care. Management/Social Worker. Management plans discussed with  the patient, family and they are in agreement.  CODE STATUS: Full Code  TOTAL TIME TAKING CARE OF THIS PATIENT: 35 minutes.   Avel Peace Desma Wilkowski M.D on 04/11/2019 at 11:58 AM  Between 7am to 6pm - Pager - [phone]  After 6pm go to [email or web address] - [facility]

## 2019-04-11 NOTE — Progress Notes (Signed)
Inpatient Diabetes Program Recommendations  AACE/ADA: New Consensus Statement on Inpatient Glycemic Control (2015)  Target Ranges:  Prepandial:   less than 140 mg/dL      Peak postprandial:   less than 180 mg/dL (1-2 hours)      Critically ill patients:  140 - 180 mg/dL   Results for Roy Oconnell, Roy Oconnell (MRN [id number]) as of 04/11/2019 08:24  Ref. Range 04/10/2019 00:23 04/10/2019 07:43 04/10/2019 12:14 04/10/2019 16:57 04/10/2019 20:27  Glucose-Capillary Latest Ref Range: 70 - 99 mg/dL 116 (H) 123 (H)  2 units NOVOLOG  145 (H)  2 units NOVOLOG +   30 units LANTUS given at 11am 170 (H)  3 units NOVOLOG  175 (H)  3 units NOVOLOG +   30 units LANTUS given at 9:35pm   Results for Roy Oconnell, Roy Oconnell (MRN [id number]) as of 04/11/2019 08:24  Ref. Range 04/10/2019 23:53 04/11/2019 05:02 04/11/2019 07:32  Glucose-Capillary Latest Ref Range: 70 - 99 mg/dL 119 (H) 80 84     Admit New Onset Seizures/ DKA/ Acute Kidney Injury/ Possible Sepsis (Glucose 739, CO2 9, AG 30)  History:DM2   Home DM Meds: Glipizide 10 mg BID  Current Orders:Lantus30 units BID Novolog Moderate Correction Scale/ SSI (0-15 units)Q4 hours       MD- CBG only 80 mg/dl at 5am today.  May consider reducing Lantus slightly to 25 units BID     --Will follow patient during hospitalization--  Wyn Quaker RN, MSN, CDE Diabetes Coordinator Inpatient Glycemic Control Team Team Pager: [phone] (8a-5p)

## 2019-04-11 NOTE — Plan of Care (Signed)
Nutrition Education Note   RD consulted for nutrition education regarding diabetes.   Lab Results  Component Value Date   HGBA1C 14.8 (H) 04/01/2019   43 y.o. black male with diabetes mellitus type 2, hypertension, hyperlipidemia, prior history of kidney donation, chronic kidney disease stage III baseline creatinine 1.5 on August 27, 2018, who was admitted to [facility] on 04/01/2019 for evaluation of seizures and loss of consciousness. Pt with DKA and AKI,CKD requiring HD.    RD provided "Nutrition and Type II Diabetes" handout from the Academy of Nutrition and Dietetics. Discussed different food groups and their effects on blood sugar, emphasizing carbohydrate-containing foods. Provided list of carbohydrates and recommended serving sizes of common foods.  Discussed importance of controlled and consistent carbohydrate intake throughout the day. Provided examples of ways to balance meals/snacks and encouraged intake of high-fiber, whole grain complex carbohydrates.   RD also provided patient with renal diet education  Provided "Low sodium Nutrition Therapy" handout from the Academy of Nutrition and Dietetics. Reviewed food groups and provided written recommended serving sizes specifically determined for patient's current nutritional status.   Explained why diet restrictions are needed and provided lists of foods to limit/avoid that are high potassium, sodium, and phosphorus. Provided specific recommendations on safer alternatives of these foods. Strongly encouraged compliance of this diet.   Discussed importance of protein intake at each meal and snack. Provided examples of how to maximize protein intake throughout the day. Discussed the possible need for fluid restriction with dialysis, importance of minimizing weight gain between HD treatments, and renal-friendly beverage options.  Encouraged pt to take a daily renal multivitamin   Encouraged pt to discuss specific diet questions/concerns with RD  at HD outpatient facility. Teach back method used.  Expect fair compliance.  Body mass index is 43 kg/m. Pt meets criteria for morbid obesity based on current BMI.  RD following this patient   Koleen Distance MS, RD, LDN Pager #- [phone] Office#- [phone] After Hours Pager: [phone]

## 2019-04-11 NOTE — Progress Notes (Signed)
Met with pt again today.    Looks like the plan is to discharge pt on Levemir and Humalog insulins when that time comes.  Discussed with pt what Levemir and Humalog insulins are, how they work, when to take, etc.  Also rediscussed signs and symptoms of HYPO and how to treat HYPO at home.  Pt able to tell me the symptoms with minimal assistance and also able to verbalize appropriate HYPO treatment.  Educated patient on insulin pen use at home.  Reviewed all steps of insulin pen including attachment of needle, 2-unit air shot, dialing up dose, giving injection, rotation of injection sites, removing needle, disposal of sharps, storage of unused insulin, disposal of insulin etc.  Patient able to provide successful return demonstration.  Reviewed troubleshooting with insulin pen.  Have asked RNs caring for patient to please allow patient to give all injections here in hospital as much as possible for practice.      --Will follow patient during hospitalization--  Wyn Quaker RN, MSN, CDE Diabetes Coordinator Inpatient Glycemic Control Team Team Pager: [phone] (8a-5p) .

## 2019-04-11 NOTE — Progress Notes (Signed)
Following this patient for hemodialysis outpatient placement.   Elvera Bicker Dialysis Coordinator [phone]

## 2019-04-11 NOTE — Progress Notes (Signed)
Nutrition Follow-up  RD working remotely.  DOCUMENTATION CODES:   Morbid obesity  INTERVENTION:   Provide Nepro Shake po BID, each supplement provides 425 kcal and 19 grams protein.  Provide Rena-vite QHS.  Carbohydrate controlled and renal diet education   NUTRITION DIAGNOSIS:   Inadequate oral intake related to inability to eat as evidenced by NPO status.  Resolving   GOAL:   Patient will meet greater than or equal to 90% of their needs  Progressing.  MONITOR:   PO intake, Supplement acceptance, Labs, Weight trends, I & O's  ASSESSMENT:   43 year old male with PMHx of DM, HTN, HLD admitted with severe DKA with severe metabolic acidosis and severe respiratory failure requiring intubation on 6/12, also with seizures, aspiration PNA, and severe acute renal failure.   Spoke with pt via phone. Pt reports that his appetite is improving; pt does not really like the hospital food but reports eating 100% of meals and drinking some Nepro. Pt does not really like the Nepro supplements but reports he is willing to drink them if he needs to. RD provided renal and diabetic diet educations today. Per chart, pt is weight stable since admit. RD will change pt to carbohydrate controlled diet and add renal diet restrictions via health touch as the renal diet does not restrict phosphorus.   Medications reviewed and include: epoetin, insulin, rena-vite  Labs reviewed: K 3.9 wnl, BUN 75(H), creat 13.97(H) P 7.2(H)- 6/19 Wbc- 15.4(H), Hgb 9.7(L), Hct 29.7(L) cbgs- 80, 84, 75 x 24 hrs AIC 14.8(H)- 6/12  Diet Order:   Diet Order            Diet Carb Modified Fluid consistency: Thin; Room service appropriate? Yes  Diet effective now             EDUCATION NEEDS:   No education needs have been identified at this time  Skin:  Skin Assessment: Reviewed RN Assessment  Last BM:  6/21- type 6  Height:   Ht Readings from Last 1 Encounters:  04/03/19 5\' 10"  (1.778 m)   Weight:    Wt Readings from Last 1 Encounters:  04/11/19 (!) 138.8 kg   Ideal Body Weight:  75.5 kg  BMI:  Body mass index is 43.89 kg/m.  Estimated Nutritional Needs:   Kcal:  6979-4801  Protein:  125-140 grams  Fluid:  UOP + 1 L  Koleen Distance MS, RD, LDN Pager #- [phone] Office#- [phone] After Hours Pager: [phone]

## 2019-04-11 NOTE — Progress Notes (Signed)
[facility]  ROUNDING NOTE   Subjective:   Denies any acute complaints No complaint of shortness of breath  Objective:  Vital signs in last 24 hours:  Temp:  [99 F (37.2 C)-99.3 F (37.4 C)] 99 F (37.2 C) (06/22 0505) Pulse Rate:  [99-107] 102 (06/22 0505) Resp:  [18-20] 18 (06/22 0505) BP: (141-169)/(79-81) 141/79 (06/22 0505) SpO2:  [96 %-99 %] 97 % (06/22 0505) Weight:  [138.8 kg] 138.8 kg (06/22 0505)  Weight change: -0.845 kg Filed Weights   04/09/19 1155 04/09/19 1509 04/11/19 0505  Weight: (!) 139.6 kg (!) 138.3 kg (!) 138.8 kg    Intake/Output: I/O last 3 completed shifts: In: 480 [P.O.:480] Out: -    Intake/Output this shift:  No intake/output data recorded.  Physical Exam: General: Laying in bed  Head: Moist mucosal membranes  Eyes: Anicteric  Neck:  Supple, no masses  Lungs:  Clear   Heart: regular  Abdomen:  Soft, obese,   Extremities: No peripheral edema.  Neurologic: Alert and oriented.   Skin: No lesions  Access:  RIJ permcath 6/18.      Basic Metabolic Panel: Recent Labs  Lab 04/04/19 1208 04/04/19 1538 04/05/19 0434  04/07/19 0408 04/08/19 1147 04/09/19 0946 04/10/19 0437 04/11/19 0641  NA  --   --  137   < > 140 135 135 134* 138  K  --   --  3.8   < > 3.5 3.3* 3.7 3.7 3.9  CL  --   --  103   < > 101 95* 97* 94* 97*  CO2  --   --  23   < > 20* 21* 20* 23 21*  GLUCOSE  --   --  251*   < > 246* 228* 193* 130* 84  BUN  --   --  55*   < > 86* 69* 82* 58* 75*  CREATININE  --   --  7.71*   < > 11.49* 10.35* 13.36* 10.41* 13.97*  CALCIUM  --   --  8.1*   < > 8.2* 8.2* 8.1* 8.1* 8.4*  MG 2.1 2.0 2.0  --  2.0  --   --   --   --   PHOS  --   --   --   --   --  7.2*  --   --   --    < > = values in this interval not displayed.    Liver Function Tests: Recent Labs  Lab 04/08/19 1147  ALBUMIN 2.9*   No results for input(s): LIPASE, AMYLASE in the last 168 hours. No results for input(s): AMMONIA in the last 168  hours.  CBC: Recent Labs  Lab 04/05/19 0434 04/05/19 2320  WBC 9.9 15.4*  NEUTROABS 6.5  --   HGB 9.0* 9.7*  HCT 27.9* 29.7*  MCV 94.3 92.0  PLT 161 207    Cardiac Enzymes: No results for input(s): CKTOTAL, CKMB, CKMBINDEX, TROPONINI in the last 168 hours.  BNP: Invalid input(s): POCBNP  CBG: Recent Labs  Lab 04/10/19 1657 04/10/19 2027 04/10/19 2353 04/11/19 0502 04/11/19 0732  GLUCAP 170* 175* 119* 80 52    Microbiology: Results for orders placed or performed during the hospital encounter of 04/01/19  Culture, blood (routine x 2)     Status: None   Collection Time: 04/01/19  5:30 AM   Specimen: BLOOD  Result Value Ref Range Status   Specimen Description BLOOD BLOOD LEFT HAND  Final   Special  Requests   Final    BOTTLES DRAWN AEROBIC AND ANAEROBIC Blood Culture results may not be optimal due to an inadequate volume of blood received in culture bottles   Culture   Final    NO GROWTH 5 DAYS Performed at [facility], [address], [place], [place] [address]    Report Status 04/06/2019 FINAL  Final  Urine culture     Status: None   Collection Time: 04/01/19  5:30 AM   Specimen: Urine, Random  Result Value Ref Range Status   Specimen Description   Final    URINE, RANDOM Performed at [facility], [address] [place], [place] [address]    Special Requests   Final    NONE Performed at [facility], [address], [place], [place] [address]    Culture   Final    NO GROWTH Performed at [facility], [place] [address] [place],  [address]    Report Status 04/02/2019 FINAL  Final  SARS Coronavirus 2 (CEPHEID - Performed in [place] hospital lab), Hosp Order     Status: None   Collection Time: 04/01/19  5:30 AM   Specimen: Nasopharyngeal Swab  Result Value Ref Range Status   SARS Coronavirus 2 NEGATIVE NEGATIVE Final    Comment: (NOTE) If result is NEGATIVE SARS-CoV-2 target nucleic acids are NOT  DETECTED. The SARS-CoV-2 RNA is generally detectable in upper and lower  respiratory specimens during the acute phase of infection. The lowest  concentration of SARS-CoV-2 viral copies this assay can detect is 250  copies / mL. A negative result does not preclude SARS-CoV-2 infection  and should not be used as the sole basis for treatment or other  patient management decisions.  A negative result may occur with  improper specimen collection / handling, submission of specimen other  than nasopharyngeal swab, presence of viral mutation(s) within the  areas targeted by this assay, and inadequate number of viral copies  (<250 copies / mL). A negative result must be combined with clinical  observations, patient history, and epidemiological information. If result is POSITIVE SARS-CoV-2 target nucleic acids are DETECTED. The SARS-CoV-2 RNA is generally detectable in upper and lower  respiratory specimens dur ing the acute phase of infection.  Positive  results are indicative of active infection with SARS-CoV-2.  Clinical  correlation with patient history and other diagnostic information is  necessary to determine patient infection status.  Positive results do  not rule out bacterial infection or co-infection with other viruses. If result is PRESUMPTIVE POSTIVE SARS-CoV-2 nucleic acids MAY BE PRESENT.   A presumptive positive result was obtained on the submitted specimen  and confirmed on repeat testing.  While 2019 novel coronavirus  (SARS-CoV-2) nucleic acids may be present in the submitted sample  additional confirmatory testing may be necessary for epidemiological  and / or clinical management purposes  to differentiate between  SARS-CoV-2 and other Sarbecovirus currently known to infect humans.  If clinically indicated additional testing with an alternate test  methodology [phone]) is advised. The SARS-CoV-2 RNA is generally  detectable in upper and lower respiratory sp ecimens during  the acute  phase of infection. The expected result is Negative. Fact Sheet for Patients:  StrictlyIdeas.no Fact Sheet for Healthcare Providers: BankingDealers.co.za This test is not yet approved or cleared by the [place] FDA and has been authorized for detection and/or diagnosis of SARS-CoV-2 by FDA under an Emergency Use Authorization (EUA).  This EUA will remain  in effect (meaning this test can be used) for the duration of the COVID-19 declaration under Section 564(b)(1) of the Act, 21 U.S.C. section 360bbb-3(b)(1), unless the authorization is terminated or revoked sooner. Performed at [facility], [address] [place], [place] [address]   Culture, blood (routine x 2)     Status: None   Collection Time: 04/01/19  6:49 AM   Specimen: BLOOD  Result Value Ref Range Status   Specimen Description BLOOD BLOOD RIGHT HAND  Final   Special Requests   Final    BOTTLES DRAWN AEROBIC AND ANAEROBIC Blood Culture results may not be optimal due to an excessive volume of blood received in culture bottles   Culture   Final    NO GROWTH 5 DAYS Performed at [facility], [address] [place], [place] [address]    Report Status 04/06/2019 FINAL  Final  MRSA PCR Screening     Status: None   Collection Time: 04/01/19 10:58 AM   Specimen: Nasal Mucosa; Nasopharyngeal  Result Value Ref Range Status   MRSA by PCR NEGATIVE NEGATIVE Final    Comment:        The GeneXpert MRSA Assay (FDA approved for NASAL specimens only), is one component of a comprehensive MRSA colonization surveillance program. It is not intended to diagnose MRSA infection nor to guide or monitor treatment for MRSA infections. Performed at [facility], [address] [place], [place] [address]   Culture, respiratory (non-expectorated)     Status: None   Collection Time: 04/01/19 12:50 PM   Specimen: Tracheal Aspirate; Respiratory   Result Value Ref Range Status   Specimen Description   Final    TRACHEAL ASPIRATE Performed at [facility], [address] [place], [place] [address]    Special Requests   Final    NONE Performed at [facility], [address] [place], [place] [address]    Gram Stain   Final    MODERATE WBC PRESENT, PREDOMINANTLY PMN FEW SQUAMOUS EPITHELIAL CELLS PRESENT ABUNDANT GRAM POSITIVE COCCI RARE GRAM POSITIVE RODS Performed at [facility], [place] [address], [place], [place] [address]    Culture FEW METHICILLIN RESISTANT STAPHYLOCOCCUS AUREUS  Final   Report Status 04/04/2019 FINAL  Final   Organism ID, Bacteria METHICILLIN RESISTANT STAPHYLOCOCCUS AUREUS  Final      Susceptibility   Methicillin resistant staphylococcus aureus - MIC*    CIPROFLOXACIN <=0.5 SENSITIVE Sensitive     ERYTHROMYCIN <=0.25 SENSITIVE Sensitive     GENTAMICIN <=0.5 SENSITIVE Sensitive     OXACILLIN >=4 RESISTANT Resistant     TETRACYCLINE <=1 SENSITIVE Sensitive     VANCOMYCIN <=0.5 SENSITIVE Sensitive     TRIMETH/SULFA <=10 SENSITIVE Sensitive     CLINDAMYCIN <=0.25 SENSITIVE Sensitive     RIFAMPIN <=0.5 SENSITIVE Sensitive     Inducible Clindamycin NEGATIVE Sensitive     * FEW METHICILLIN RESISTANT STAPHYLOCOCCUS AUREUS  C difficile quick scan w PCR reflex     Status: None   Collection Time: 04/04/19  9:07 PM   Specimen: STOOL  Result Value Ref Range Status   C Diff antigen NEGATIVE NEGATIVE Final   C Diff toxin NEGATIVE NEGATIVE Final   C Diff interpretation No C. difficile detected.  Final    Comment: Performed at [facility], [address] [place],  [address]    Coagulation Studies: Recent Labs    04/10/19 1340  LABPROT 14.5  INR 1.1  Urinalysis: No results for input(s): COLORURINE, LABSPEC, PHURINE, GLUCOSEU, HGBUR, BILIRUBINUR, KETONESUR, PROTEINUR, UROBILINOGEN, NITRITE, LEUKOCYTESUR in the last 72 hours.  Invalid input(s): APPERANCEUR     Imaging: No results found.   Medications:    . amitriptyline  25 mg Oral QHS  . amLODipine  10 mg Oral Daily  . chlorhexidine gluconate (MEDLINE KIT)  15 mL Mouth Rinse BID  . Chlorhexidine Gluconate Cloth  6 each Topical Daily  . epoetin (EPOGEN/PROCRIT) injection  10,000 Units Intravenous Q T,Th,Sa-HD  . feeding supplement (NEPRO CARB STEADY)  237 mL Oral BID BM  . gabapentin  100 mg Oral QHS  . insulin aspart  0-15 Units Subcutaneous Q4H  . insulin glargine  30 Units Subcutaneous BID  . insulin starter kit- syringes  1 kit Other Once  . irbesartan  300 mg Oral Daily  . labetalol  100 mg Oral TID  . levETIRAcetam  500 mg Oral q1800  . multivitamin  1 tablet Oral QHS  . polyethylene glycol  17 g Oral Daily  . sodium chloride flush  3 mL Intravenous Q12H   acetaminophen, albuterol, cyclobenzaprine, diphenhydrAMINE, famotidine, heparin, hydrALAZINE, labetalol, methylPREDNISolone (SOLU-MEDROL) injection, midazolam, ondansetron (ZOFRAN) IV, ondansetron **OR** [DISCONTINUED] ondansetron (ZOFRAN) IV  Assessment/ Plan:    Mr. Roy Oconnell is a 43 y.o. black male with diabetes mellitus type 2, hypertension, hyperlipidemia, prior history of kidney donation, chronic kidney disease stage III baseline creatinine 1.5 on August 27, 2018, who was admitted to [facility] on 04/01/2019 for evaluation of seizures and loss of consciousness.  1.  ESRD.  Chronic kidney disease stage with proteinuria is likely Secondary to solitary kidney, hypertension and diabetic nephropathy. Severe prolonged oliguric AKI may have progressed to ESRD Requiring renal replacement therapy.  CRRT stopped on 6/15. Intermittent hemodialysis on 6/16, 6/18, 6/20. Permcath placed on on 6/18.    - Start outpatient d/c planning.  - Patient will need to sit in a chair - next HD planned for Tuesday  2.  Diabetes mellitus type II with chronic kidney disease: noninsulin dependent prior to admission. Hemoglobin A1c of 14.8%  - uncontrolled. Patient reports he was not able to afford his medications.  Due to poor diabetic control, suspect renal recovery to be less likely.   3.  Anemia with chronic kidney disease:   - EPO with HD treatment TTS  4. Seizures: -  levitiracetam.          LOS: 10 Rena Sweeden 6/22/202010:02 AM

## 2019-04-12 LAB — GLUCOSE, CAPILLARY
Glucose-Capillary: 68 mg/dL — ABNORMAL LOW (ref 70–99)
Glucose-Capillary: 103 mg/dL — ABNORMAL HIGH (ref 70–99)
Glucose-Capillary: 69 mg/dL — ABNORMAL LOW (ref 70–99)
Glucose-Capillary: 73 mg/dL (ref 70–99)
Glucose-Capillary: 103 mg/dL — ABNORMAL HIGH (ref 70–99)
Glucose-Capillary: 114 mg/dL — ABNORMAL HIGH (ref 70–99)

## 2019-04-12 LAB — BASIC METABOLIC PANEL
Anion gap: 23 — ABNORMAL HIGH (ref 5–15)
BUN: 85 mg/dL — ABNORMAL HIGH (ref 6–20)
CO2: 21 mmol/L — ABNORMAL LOW (ref 22–32)
Calcium: 8.2 mg/dL — ABNORMAL LOW (ref 8.9–10.3)
Chloride: 94 mmol/L — ABNORMAL LOW (ref 98–111)
Creatinine, Ser: 16.04 mg/dL — ABNORMAL HIGH (ref 0.61–1.24)
GFR calc Af Amer: 4 mL/min — ABNORMAL LOW (ref >60)
GFR calc non Af Amer: 3 mL/min — ABNORMAL LOW (ref >60)
Glucose, Bld: 89 mg/dL (ref 70–99)
Potassium: 4.6 mmol/L (ref 3.5–5.1)
Sodium: 138 mmol/L (ref 135–145)

## 2019-04-12 MED ORDER — INSULIN DETEMIR 100 UNIT/ML ~~LOC~~ SOLN
22.0000 [IU] | Freq: Two times a day (BID) | SUBCUTANEOUS | Status: DC
  Administered 2019-04-12: 22 [IU] via SUBCUTANEOUS
  Filled 2019-04-12 (×3): qty 0.22

## 2019-04-12 NOTE — Progress Notes (Signed)
Hemodialysis- Initiated via R Chest HD cath without issue. Dressing changed. Patient currently without complaints. CBG rechecked =103. All vitals stable. Continue to monitor.

## 2019-04-12 NOTE — Progress Notes (Signed)
PT Cancellation Note  Patient Details Name: Roy Oconnell MRN: [id number] DOB: 15-Oct-1976   Cancelled Treatment:    Reason Eval/Treat Not Completed: Other (comment). Pt at HD at this time, unavailable for treatment. Will re-attempt at another time.   Dianna Deshler 04/12/2019, 10:21 AM Greggory Stallion, PT, DPT [phone]

## 2019-04-12 NOTE — Progress Notes (Signed)
[facility] [place] NAME: Roy Oconnell    MR#:  [id number]  DATE OF BIRTH:  1975/11/22  SUBJECTIVE:   Patient states that he is doing well today.  He has no concerns this morning.  He states that HD is going well.  Denies any chest pain or shortness of breath.  He is looking forward to being discharged from the hospital.  REVIEW OF SYSTEMS:  Review of Systems  Constitutional: Negative for chills and fever.  HENT: Negative for congestion and sore throat.   Eyes: Negative for blurred vision and double vision.  Respiratory: Negative for cough and shortness of breath.   Cardiovascular: Negative for chest pain and palpitations.  Gastrointestinal: Negative for nausea and vomiting.  Genitourinary: Negative for dysuria and urgency.  Musculoskeletal: Negative for back pain and neck pain.  Neurological: Negative for dizziness and headaches.  Psychiatric/Behavioral: Negative for depression. The patient is not nervous/anxious.     DRUG ALLERGIES:  No Known Allergies VITALS:  Blood pressure (!) 178/90, pulse 99, temperature 98.7 F (37.1 C), temperature source Oral, resp. rate 19, height 5\' 10"  (1.778 m), weight 135.2 kg, SpO2 98 %. PHYSICAL EXAMINATION:  Physical Exam HENT:     Head: Normocephalic.  Eyes:     General: No scleral icterus.    Conjunctiva/sclera: Conjunctivae normal.     Pupils: Pupils are equal, round, and reactive to light.  Neck:     Musculoskeletal: Neck supple.     Vascular: No JVD.     Trachea: No tracheal deviation.  Cardiovascular:     Rate and Rhythm: Normal rate and regular rhythm.     Heart sounds: Normal heart sounds. No murmur. No gallop.   Pulmonary:     Effort: Pulmonary effort is normal. No respiratory distress.     Breath sounds: Normal breath sounds. No wheezing or rales.  Abdominal:     General: Bowel sounds are normal. There is no distension.     Palpations: Abdomen is soft.     Tenderness: There  is no abdominal tenderness. There is no rebound.  Musculoskeletal:        General: No tenderness.  Skin:    Findings: No erythema or rash.    LABORATORY PANEL:  Male CBC Recent Labs  Lab 04/05/19 2320  WBC 15.4*  HGB 9.7*  HCT 29.7*  PLT 207   ------------------------------------------------------------------------------------------------------------------ Chemistries  Recent Labs  Lab 04/07/19 0408  04/12/19 0430  NA 140   < > 138  K 3.5   < > 4.6  CL 101   < > 94*  CO2 20*   < > 21*  GLUCOSE 246*   < > 89  BUN 86*   < > 85*  CREATININE 11.49*   < > 16.04*  CALCIUM 8.2*   < > 8.2*  MG 2.0  --   --    < > = values in this interval not displayed.   RADIOLOGY:  Dg Chest 2 View  Result Date: 04/11/2019 CLINICAL DATA:  Patient admitted 10 days ago with end-stage renal disease, new onset seizures and sepsis. EXAM: CHEST - 2 VIEW COMPARISON:  Single-view of the chest 04/03/2019. FINDINGS: Right IJ approach dialysis catheter is in place with the tip in the lower superior vena cava. No pneumothorax. Support apparatus is otherwise been removed since the prior exam. Lungs are clear. Heart size is normal. No pneumothorax or pleural fluid. No acute or focal bony abnormality. IMPRESSION: No  acute disease. Dialysis catheter tip is in the lower superior vena cava. Electronically Signed   By: Inge Rise M.D.   On: 04/11/2019 17:10   ASSESSMENT AND PLAN:  New onset ESRD  -S/p permcath placement -Nephrology following -Awaiting outpatient HD arrangements  Type 2 diabetes-initially presented in DKA, but this has resolved -Continue Levemir and SSI  Seizures-new this admission -Continue Keppra -Needs to follow-up with neurology as an outpatient  Acute septic shock and aspiration pneumonia-resolved -Has completed a course of IV Zosyn  Constipation- improved -Continue Miralax  DVT prophylaxis-Heparin  All the records are reviewed and case discussed with Care.  Management/Social Worker. Management plans discussed with the patient, family and they are in agreement.  CODE STATUS: Full Code  TOTAL TIME TAKING CARE OF THIS PATIENT: 32 minutes.   Berna Spare Joniqua Sidle M.D on 04/12/2019 at 1:58 PM  Between 7am to 6pm - Pager [phone]  After 6pm go to [email or web address] - [facility]

## 2019-04-12 NOTE — Progress Notes (Signed)
Inpatient Diabetes Program Recommendations  AACE/ADA: New Consensus Statement on Inpatient Glycemic Control (2015)  Target Ranges:  Prepandial:   less than 140 mg/dL      Peak postprandial:   less than 180 mg/dL (1-2 hours)      Critically ill patients:  140 - 180 mg/dL    Results for LOIS, OSTROM (MRN [id number]) as of 04/12/2019 08:41  Ref. Range 04/10/2019 23:53 04/11/2019 05:02 04/11/2019 07:32 04/11/2019 11:40 04/11/2019 16:37 04/11/2019 21:42  Glucose-Capillary Latest Ref Range: 70 - 99 mg/dL 119 (H) 80 84 75  30 units LANTUS 80 122 (H)  25 units LEVEMIR   Results for CAPRICE, WASKO (MRN [id number]) as of 04/12/2019 08:41  Ref. Range 04/12/2019 08:17  Glucose-Capillary Latest Ref Range: 70 - 99 mg/dL 68 (L)    Admit New Onset Seizures/ DKA/ Acute Kidney Injury/ Possible Sepsis (Glucose 739, CO2 9, AG 30)  History:DM2   Home DM Meds: Glipizide 10 mg BID  Current Orders:Levemir 25 units BID      Novolog Moderate Correction Scale/ SSI (0-15 units) TID AC + HS     MD- Note patient received a total of 55 units basal insulin yesterday (30 units Lantus yesterday AM and 25 units Levemir last PM)  Mild Hypoglycemia this AM (CBG 68 mg/dl)   Please consider reducing Levemir to 22 units BID     --Will follow patient during hospitalization--  Wyn Quaker RN, MSN, CDE Diabetes Coordinator Inpatient Glycemic Control Team Team Pager: [phone] (8a-5p)

## 2019-04-12 NOTE — Progress Notes (Signed)
[facility]  ROUNDING NOTE   Subjective:   Denies any acute complaints No complaint of shortness of breath Seen during dialysis   HEMODIALYSIS FLOWSHEET:  Blood Flow Rate (mL/min): 400 mL/min Arterial Pressure (mmHg): -210 mmHg Venous Pressure (mmHg): 170 mmHg Transmembrane Pressure (mmHg): 40 mmHg Ultrafiltration Rate (mL/min): 430 mL/min Dialysate Flow Rate (mL/min): 600 ml/min Conductivity: Machine : 14 Conductivity: Machine : 14 Dialysis Fluid Bolus: Normal Saline Bolus Amount (mL): 250 mL Dialysate Change: 2K    Objective:  Vital signs in last 24 hours:  Temp:  [98.6 F (37 C)-99.4 F (37.4 C)] 98.7 F (37.1 C) (06/23 0850) Pulse Rate:  [96-110] 99 (06/23 0945) Resp:  [18-28] 23 (06/23 0945) BP: (126-180)/(73-107) 180/85 (06/23 0945) SpO2:  [96 %-99 %] 98 % (06/23 0850) Weight:  [135.2 kg] 135.2 kg (06/23 0500)  Weight change: -3.538 kg Filed Weights   04/09/19 1509 04/11/19 0505 04/12/19 0500  Weight: (!) 138.3 kg (!) 138.8 kg 135.2 kg    Intake/Output: I/O last 3 completed shifts: In: 480 [P.O.:480] Out: 1 [Stool:1]   Intake/Output this shift:  Total I/O In: 120 [P.O.:120] Out: -   Physical Exam: General: Laying in bed  Head: Moist mucosal membranes  Eyes: Anicteric  Neck:  Supple, no masses  Lungs:  Clear   Heart: regular  Abdomen:  Soft, obese,   Extremities: No peripheral edema.  Neurologic: Alert and oriented.   Skin: No lesions  Access:  RIJ permcath 6/18.      Basic Metabolic Panel: Recent Labs  Lab 04/07/19 0408 04/08/19 1147 04/09/19 0946 04/10/19 0437 04/11/19 0641 04/12/19 0430  NA 140 135 135 134* 138 138  K 3.5 3.3* 3.7 3.7 3.9 4.6  CL 101 95* 97* 94* 97* 94*  CO2 20* 21* 20* 23 21* 21*  GLUCOSE 246* 228* 193* 130* 84 89  BUN 86* 69* 82* 58* 75* 85*  CREATININE 11.49* 10.35* 13.36* 10.41* 13.97* 16.04*  CALCIUM 8.2* 8.2* 8.1* 8.1* 8.4* 8.2*  MG 2.0  --   --   --   --   --   PHOS  --  7.2*  --   --    --   --     Liver Function Tests: Recent Labs  Lab 04/08/19 1147  ALBUMIN 2.9*   No results for input(s): LIPASE, AMYLASE in the last 168 hours. No results for input(s): AMMONIA in the last 168 hours.  CBC: Recent Labs  Lab 04/05/19 2320  WBC 15.4*  HGB 9.7*  HCT 29.7*  MCV 92.0  PLT 207    Cardiac Enzymes: No results for input(s): CKTOTAL, CKMB, CKMBINDEX, TROPONINI in the last 168 hours.  BNP: Invalid input(s): POCBNP  CBG: Recent Labs  Lab 04/11/19 1637 04/11/19 2142 04/12/19 0817 04/12/19 0841 04/12/19 0903  GLUCAP 80 122* 68* 76* 103*    Microbiology: Results for orders placed or performed during the hospital encounter of 04/01/19  Culture, blood (routine x 2)     Status: None   Collection Time: 04/01/19  5:30 AM   Specimen: BLOOD  Result Value Ref Range Status   Specimen Description BLOOD BLOOD LEFT HAND  Final   Special Requests   Final    BOTTLES DRAWN AEROBIC AND ANAEROBIC Blood Culture results may not be optimal due to an inadequate volume of blood received in culture bottles   Culture   Final    NO GROWTH 5 DAYS Performed at [facility], [address], [place], [place] [address]  Report Status 04/06/2019 FINAL  Final  Urine culture     Status: None   Collection Time: 04/01/19  5:30 AM   Specimen: Urine, Random  Result Value Ref Range Status   Specimen Description   Final    URINE, RANDOM Performed at [facility] [address], [place], [place] [address]    Special Requests   Final    NONE Performed at [facility], [address], [place], [place] [address]    Culture   Final    NO GROWTH Performed at [facility], [place] [address], [place], [place] [address]    Report Status 04/02/2019 FINAL  Final  SARS Coronavirus 2 (CEPHEID - Performed in [place] hospital lab), Hosp Order     Status: None   Collection Time: 04/01/19  5:30 AM   Specimen: Nasopharyngeal Swab  Result Value Ref Range  Status   SARS Coronavirus 2 NEGATIVE NEGATIVE Final    Comment: (NOTE) If result is NEGATIVE SARS-CoV-2 target nucleic acids are NOT DETECTED. The SARS-CoV-2 RNA is generally detectable in upper and lower  respiratory specimens during the acute phase of infection. The lowest  concentration of SARS-CoV-2 viral copies this assay can detect is 250  copies / mL. A negative result does not preclude SARS-CoV-2 infection  and should not be used as the sole basis for treatment or other  patient management decisions.  A negative result may occur with  improper specimen collection / handling, submission of specimen other  than nasopharyngeal swab, presence of viral mutation(s) within the  areas targeted by this assay, and inadequate number of viral copies  (<250 copies / mL). A negative result must be combined with clinical  observations, patient history, and epidemiological information. If result is POSITIVE SARS-CoV-2 target nucleic acids are DETECTED. The SARS-CoV-2 RNA is generally detectable in upper and lower  respiratory specimens dur ing the acute phase of infection.  Positive  results are indicative of active infection with SARS-CoV-2.  Clinical  correlation with patient history and other diagnostic information is  necessary to determine patient infection status.  Positive results do  not rule out bacterial infection or co-infection with other viruses. If result is PRESUMPTIVE POSTIVE SARS-CoV-2 nucleic acids MAY BE PRESENT.   A presumptive positive result was obtained on the submitted specimen  and confirmed on repeat testing.  While 2019 novel coronavirus  (SARS-CoV-2) nucleic acids may be present in the submitted sample  additional confirmatory testing may be necessary for epidemiological  and / or clinical management purposes  to differentiate between  SARS-CoV-2 and other Sarbecovirus currently known to infect humans.  If clinically indicated additional testing with an alternate  test  methodology [phone]) is advised. The SARS-CoV-2 RNA is generally  detectable in upper and lower respiratory sp ecimens during the acute  phase of infection. The expected result is Negative. Fact Sheet for Patients:  StrictlyIdeas.no Fact Sheet for Healthcare Providers: BankingDealers.co.za This test is not yet approved or cleared by the [place] FDA and has been authorized for detection and/or diagnosis of SARS-CoV-2 by FDA under an Emergency Use Authorization (EUA).  This EUA will remain in effect (meaning this test can be used) for the duration of the COVID-19 declaration under Section 564(b)(1) of the Act, 21 U.S.C. section 360bbb-3(b)(1), unless the authorization is terminated or revoked sooner. Performed at [facility], [address] [place], [place] [address]   Culture, blood (routine x 2)     Status: None  Collection Time: 04/01/19  6:49 AM   Specimen: BLOOD  Result Value Ref Range Status   Specimen Description BLOOD BLOOD RIGHT HAND  Final   Special Requests   Final    BOTTLES DRAWN AEROBIC AND ANAEROBIC Blood Culture results may not be optimal due to an excessive volume of blood received in culture bottles   Culture   Final    NO GROWTH 5 DAYS Performed at [facility], [address] [place], [place] [address]    Report Status 04/06/2019 FINAL  Final  MRSA PCR Screening     Status: None   Collection Time: 04/01/19 10:58 AM   Specimen: Nasal Mucosa; Nasopharyngeal  Result Value Ref Range Status   MRSA by PCR NEGATIVE NEGATIVE Final    Comment:        The GeneXpert MRSA Assay (FDA approved for NASAL specimens only), is one component of a comprehensive MRSA colonization surveillance program. It is not intended to diagnose MRSA infection nor to guide or monitor treatment for MRSA infections. Performed at [facility], [address] [place], [place] [address]   Culture,  respiratory (non-expectorated)     Status: None   Collection Time: 04/01/19 12:50 PM   Specimen: Tracheal Aspirate; Respiratory  Result Value Ref Range Status   Specimen Description   Final    TRACHEAL ASPIRATE Performed at [facility], [address] [place], [place] [address]    Special Requests   Final    NONE Performed at [facility], [address] [place], [place] [address]    Gram Stain   Final    MODERATE WBC PRESENT, PREDOMINANTLY PMN FEW SQUAMOUS EPITHELIAL CELLS PRESENT ABUNDANT GRAM POSITIVE COCCI RARE GRAM POSITIVE RODS Performed at [facility], [place] [address] [place], [place] [address]    Culture FEW METHICILLIN RESISTANT STAPHYLOCOCCUS AUREUS  Final   Report Status 04/04/2019 FINAL  Final   Organism ID, Bacteria METHICILLIN RESISTANT STAPHYLOCOCCUS AUREUS  Final      Susceptibility   Methicillin resistant staphylococcus aureus - MIC*    CIPROFLOXACIN <=0.5 SENSITIVE Sensitive     ERYTHROMYCIN <=0.25 SENSITIVE Sensitive     GENTAMICIN <=0.5 SENSITIVE Sensitive     OXACILLIN >=4 RESISTANT Resistant     TETRACYCLINE <=1 SENSITIVE Sensitive     VANCOMYCIN <=0.5 SENSITIVE Sensitive     TRIMETH/SULFA <=10 SENSITIVE Sensitive     CLINDAMYCIN <=0.25 SENSITIVE Sensitive     RIFAMPIN <=0.5 SENSITIVE Sensitive     Inducible Clindamycin NEGATIVE Sensitive     * FEW METHICILLIN RESISTANT STAPHYLOCOCCUS AUREUS  C difficile quick scan w PCR reflex     Status: None   Collection Time: 04/04/19  9:07 PM   Specimen: STOOL  Result Value Ref Range Status   C Diff antigen NEGATIVE NEGATIVE Final   C Diff toxin NEGATIVE NEGATIVE Final   C Diff interpretation No C. difficile detected.  Final    Comment: Performed at [facility], [address] [place], [place] [address]    Coagulation Studies: Recent Labs    04/10/19 1340  LABPROT 14.5  INR 1.1    Urinalysis: No results for input(s): COLORURINE, LABSPEC, PHURINE, GLUCOSEU,  HGBUR, BILIRUBINUR, KETONESUR, PROTEINUR, UROBILINOGEN, NITRITE, LEUKOCYTESUR in the last 72 hours.  Invalid input(s): APPERANCEUR    Imaging: Dg Chest 2 View  Result Date: 04/11/2019 CLINICAL DATA:  Patient admitted 10 days ago with end-stage renal disease, new onset seizures and sepsis. EXAM: CHEST - 2 VIEW  COMPARISON:  Single-view of the chest 04/03/2019. FINDINGS: Right IJ approach dialysis catheter is in place with the tip in the lower superior vena cava. No pneumothorax. Support apparatus is otherwise been removed since the prior exam. Lungs are clear. Heart size is normal. No pneumothorax or pleural fluid. No acute or focal bony abnormality. IMPRESSION: No acute disease. Dialysis catheter tip is in the lower superior vena cava. Electronically Signed   By: Inge Rise M.D.   On: 04/11/2019 17:10     Medications:    . amitriptyline  25 mg Oral QHS  . amLODipine  10 mg Oral Daily  . chlorhexidine gluconate (MEDLINE KIT)  15 mL Mouth Rinse BID  . Chlorhexidine Gluconate Cloth  6 each Topical Daily  . epoetin (EPOGEN/PROCRIT) injection  10,000 Units Intravenous Q T,Th,Sa-HD  . feeding supplement (NEPRO CARB STEADY)  237 mL Oral BID BM  . gabapentin  100 mg Oral QHS  . insulin aspart  0-15 Units Subcutaneous TID WC  . insulin aspart  0-5 Units Subcutaneous QHS  . insulin detemir  25 Units Subcutaneous BID  . irbesartan  300 mg Oral Daily  . labetalol  200 mg Oral BID  . levETIRAcetam  500 mg Oral q1800  . multivitamin  1 tablet Oral QHS  . sodium chloride flush  3 mL Intravenous Q12H   acetaminophen, albuterol, cyclobenzaprine, diphenhydrAMINE, heparin, hydrALAZINE, labetalol, methylPREDNISolone (SOLU-MEDROL) injection, midazolam, ondansetron (ZOFRAN) IV, ondansetron **OR** [DISCONTINUED] ondansetron (ZOFRAN) IV  Assessment/ Plan:    Mr. Roy Oconnell is a 43 y.o. black male with diabetes mellitus type 2, hypertension, hyperlipidemia, prior history of kidney donation,  chronic kidney disease stage III baseline creatinine 1.5 on August 27, 2018, who was admitted to [facility] on 04/01/2019 for evaluation of seizures and loss of consciousness.  1.  ESRD.  Chronic kidney disease stage with proteinuria is likely Secondary to solitary kidney, hypertension and diabetic nephropathy. Severe prolonged oliguric AKI may have progressed to ESRD Requiring renal replacement therapy.  CRRT stopped on 6/15. Intermittent hemodialysis on 6/16, 6/18, 6/20. Permcath placed on on 6/18.   -  outpatient d/c planning in progress   2.  Diabetes mellitus type II with chronic kidney disease: noninsulin dependent prior to admission. Hemoglobin A1c of 14.8% - uncontrolled. Patient reports he was not able to afford his medications.  Due to poor diabetic control, suspect renal recovery to be less likely.   3.  Anemia with chronic kidney disease:   - EPO with HD treatment TTS Lab Results  Component Value Date   HGB 9.7 (L) 04/05/2019    4. Seizures: -  levitiracetam.          LOS: 11 Roy Oconnell 6/23/202010:24 AM

## 2019-04-13 ENCOUNTER — Inpatient Hospital Stay: Payer: Self-pay

## 2019-04-13 LAB — GLUCOSE, CAPILLARY
Glucose-Capillary: 63 mg/dL — ABNORMAL LOW (ref 70–99)
Glucose-Capillary: 83 mg/dL (ref 70–99)
Glucose-Capillary: 104 mg/dL — ABNORMAL HIGH (ref 70–99)
Glucose-Capillary: 77 mg/dL (ref 70–99)
Glucose-Capillary: 94 mg/dL (ref 70–99)

## 2019-04-13 LAB — BASIC METABOLIC PANEL
Anion gap: 17 — ABNORMAL HIGH (ref 5–15)
BUN: 50 mg/dL — ABNORMAL HIGH (ref 6–20)
CO2: 25 mmol/L (ref 22–32)
Calcium: 7.7 mg/dL — ABNORMAL LOW (ref 8.9–10.3)
Chloride: 94 mmol/L — ABNORMAL LOW (ref 98–111)
Creatinine, Ser: 11.29 mg/dL — ABNORMAL HIGH (ref 0.61–1.24)
GFR calc Af Amer: 6 mL/min — ABNORMAL LOW (ref >60)
GFR calc non Af Amer: 5 mL/min — ABNORMAL LOW (ref >60)
Glucose, Bld: 74 mg/dL (ref 70–99)
Potassium: 3.5 mmol/L (ref 3.5–5.1)
Sodium: 136 mmol/L (ref 135–145)

## 2019-04-13 MED ORDER — LOSARTAN POTASSIUM 100 MG PO TABS: 100.0000 mg | ORAL_TABLET | Freq: Every day | ORAL | 0 refills | Status: DC

## 2019-04-13 MED ORDER — LABETALOL HCL 200 MG PO TABS: 200.0000 mg | ORAL_TABLET | Freq: Two times a day (BID) | ORAL | 0 refills | Status: DC

## 2019-04-13 MED ORDER — ASPIRIN EC 81 MG PO TBEC: 81.0000 mg | DELAYED_RELEASE_TABLET | Freq: Every day | ORAL | 0 refills | Status: DC

## 2019-04-13 MED ORDER — LEVETIRACETAM 250 MG PO TABS: 250.0000 mg | ORAL_TABLET | Freq: Two times a day (BID) | ORAL | 0 refills | Status: DC

## 2019-04-13 MED ORDER — GABAPENTIN 100 MG PO CAPS: 100.0000 mg | ORAL_CAPSULE | Freq: Every day | ORAL | 0 refills | Status: DC

## 2019-04-13 MED ORDER — AMLODIPINE BESYLATE 10 MG PO TABS: 10.0000 mg | ORAL_TABLET | Freq: Every day | ORAL | 0 refills | Status: DC

## 2019-04-13 MED ORDER — INSULIN DETEMIR 100 UNIT/ML ~~LOC~~ SOLN
20.0000 [IU] | Freq: Two times a day (BID) | SUBCUTANEOUS | Status: DC
  Administered 2019-04-13: 22:00:00 20 [IU] via SUBCUTANEOUS
  Filled 2019-04-13 (×3): qty 0.2

## 2019-04-13 MED ORDER — IRBESARTAN 300 MG PO TABS: 300.0000 mg | ORAL_TABLET | Freq: Every day | ORAL | 0 refills | Status: DC

## 2019-04-13 MED ORDER — BASAGLAR KWIKPEN 100 UNIT/ML ~~LOC~~ SOPN: 20.0000 [IU] | PEN_INJECTOR | Freq: Every day | SUBCUTANEOUS | 0 refills | Status: DC

## 2019-04-13 MED ORDER — LEVETIRACETAM ER 500 MG PO TB24: 500.0000 mg | ORAL_TABLET | Freq: Every day | ORAL | 0 refills | Status: DC

## 2019-04-13 MED ORDER — RENA-VITE PO TABS: 1.0000 | ORAL_TABLET | Freq: Every day | ORAL | 0 refills | Status: AC

## 2019-04-13 MED ORDER — ATORVASTATIN CALCIUM 20 MG PO TABS: 20.0000 mg | ORAL_TABLET | Freq: Every day | ORAL | 0 refills | Status: DC

## 2019-04-13 NOTE — Progress Notes (Signed)
Physical Therapy Treatment Patient Details Name: Roy Oconnell MRN: [id number] DOB: 12/06/1975 Today's Date: 04/13/2019    History of Present Illness Patient is a 43 year old male admitted post seizure 6/16. SInce hospitalization patient has had perm cath placed and started hemodialysis. PMH DM2, HTN, hyperlipidemia, R kidney dontation, CKD III, baseline creatinine 1.5. Lives with roomate, completes all ADLs, does not have a car but does have reliable transportation, currently unemployeed d/t COVID; was a Herbalist.    PT Comments    Pt is making good progress towards goals with increased ambulation distance noted this session. Height adjusted on RW with improved ability to use correctly. Pt self reports B knee pain is much improved from previous session. Denies issues returning home. Treatment cut short as pt received phone call from patient services and asked therapist to leave. Will continue to progress as able.   Follow Up Recommendations  Home health PT     Equipment Recommendations  Rolling walker with 5" wheels    Recommendations for Other Services       Precautions / Restrictions Precautions Precautions: Fall Restrictions Weight Bearing Restrictions: No    Mobility  Bed Mobility Overal bed mobility: Modified Independent Bed Mobility: Supine to Sit     Supine to sit: Modified independent (Device/Increase time)     General bed mobility comments: used bed rails, safe technique.   Transfers Overall transfer level: Needs assistance Equipment used: Rolling walker (2 wheeled) Transfers: Sit to/from Stand Sit to Stand: Min guard         General transfer comment: B knee pain with flexion. Able to stand from standard surface with RW. Upright posture noted  Ambulation/Gait Ambulation/Gait assistance: Min guard Gait Distance (Feet): 80 Feet Assistive device: Rolling walker (2 wheeled) Gait Pattern/deviations: Step-through pattern     General Gait  Details: ambulated using wide BOS with reciprocal steps. 2 x 40' in room due to isolation. Slow gait with heavy reliance on RW through B UEs. RW adjusted to correct height   Stairs             Wheelchair Mobility    Modified Rankin (Stroke Patients Only)       Balance Overall balance assessment: Needs assistance Sitting-balance support: No upper extremity supported;Feet supported Sitting balance-Leahy Scale: Good     Standing balance support: Bilateral upper extremity supported Standing balance-Leahy Scale: Good                              Cognition Arousal/Alertness: Awake/alert Behavior During Therapy: WFL for tasks assessed/performed Overall Cognitive Status: Within Functional Limits for tasks assessed                                        Exercises Other Exercises Other Exercises: Received call from patient services-unable to finish treatment    General Comments        Pertinent Vitals/Pain Pain Assessment: Faces Faces Pain Scale: Hurts a little bit Pain Location: B knees with Wbing Pain Descriptors / Indicators: Aching Pain Intervention(s): Limited activity within patient's tolerance    Home Living                      Prior Function            PT Goals (current goals can now be found in the  care plan section) Acute Rehab PT Goals Patient Stated Goal: Return to walking PT Goal Formulation: With patient Time For Goal Achievement: 04/24/19 Potential to Achieve Goals: Good Progress towards PT goals: Progressing toward goals    Frequency    Min 2X/week      PT Plan Current plan remains appropriate    Co-evaluation              AM-PAC PT "6 Clicks" Mobility   Outcome Measure  Help needed turning from your back to your side while in a flat bed without using bedrails?: None Help needed moving from lying on your back to sitting on the side of a flat bed without using bedrails?: A Little Help  needed moving to and from a bed to a chair (including a wheelchair)?: A Little Help needed standing up from a chair using your arms (e.g., wheelchair or bedside chair)?: A Little Help needed to walk in hospital room?: A Little Help needed climbing 3-5 steps with a railing? : A Little 6 Click Score: 19    End of Session Equipment Utilized During Treatment: Gait belt Activity Tolerance: Patient tolerated treatment well;Patient limited by pain;Patient limited by fatigue Patient left: in bed Nurse Communication: Mobility status PT Visit Diagnosis: Unsteadiness on feet (R26.81);Muscle weakness (generalized) (M62.81);Other abnormalities of gait and mobility (R26.89);History of falling (Z91.81);Difficulty in walking, not elsewhere classified (R26.2);Pain Pain - Right/Left: (Bilat) Pain - part of body: Knee     Time: 1140-1156 PT Time Calculation (min) (ACUTE ONLY): 16 min  Charges:  $Gait Training: 8-22 mins                     Greggory Stallion, PT, DPT [phone]    Roy Oconnell 04/13/2019, 1:45 PM

## 2019-04-13 NOTE — Discharge Instructions (Signed)
It was so nice to meet you during this hospitalization!  You came into the hospital with seizures.  You were also found to be in DKA due to high blood sugars.  You had a pneumonia, which were treated.  You also had kidney failure and you were started on dialysis.  Please make sure you follow-up with your primary care doctor in the next couple of days.  Take care, Dr. Brett Albino

## 2019-04-13 NOTE — Discharge Summary (Signed)
[facility] NAME: Roy Oconnell    MR#:  [id number]  DATE OF BIRTH:  1976/10/07  DATE OF ADMISSION:  04/01/2019   ADMITTING PHYSICIAN: Hillary Bow, MD  DATE OF DISCHARGE: 04/13/19  PRIMARY CARE PHYSICIAN: Gennaro Africa, MD   ADMISSION DIAGNOSIS:  Hyperglycemia [R73.9] Unresponsive [R41.89] Acute respiratory failure with hypoxia (HCC) [J96.01] Seizure-like activity (HCC) [R56.9] Diabetic ketoacidosis with coma associated with type 2 diabetes mellitus ([place]) [E11.11] Aspiration pneumonia, unspecified aspiration pneumonia type, unspecified laterality, unspecified part of lung ([place]) [J69.0] Community acquired pneumonia, unspecified laterality [J18.9] Sepsis, due to unspecified organism, unspecified whether acute organ dysfunction present ([place]) [A41.9] DISCHARGE DIAGNOSIS:  Active Problems:   DKA (diabetic ketoacidoses) ([place])   Acute respiratory failure with hypoxia ([place])   Sepsis ([place]) ESRD  SECONDARY DIAGNOSIS:   Past Medical History:  Diagnosis Date  . Diabetes mellitus without complication ([place])   . Hyperlipemia   . Hypertension    HOSPITAL COURSE:   Roy Oconnell is a 43 year old male who presented to the ED with seizures.  In the ED, he was found to have AKI, DKA, and lactic acidosis.  He was meeting sepsis criteria on admission.  He was admitted for further management.  New onset ESRD-due to solitary kidney, hypertension, and diabetic nephropathy.  Patient had severe prolonged oliguric AKI but may have progressed to ESRD. -S/p permcath placement 6/18 -Seen by nephrology and needs to follow-up with them as an outpatient -Patient will receive outpatient HD TTS  Type 2 diabetes-initially presented in DKA, but this has resolved -Discharged home on Basaglar 20 units twice daily and Humalog  Seizures-new this admission -Discharged home on [place] -Needs to follow-up with neurology as an outpatient  Acute septic  shock and aspiration pneumonia-resolved -Initially meeting sepsis criteria, but sepsis ruled out with negative cultures. -Has completed a course of IV Zosyn for aspiration pneumonia   DISCHARGE CONDITIONS:  ESRD Type 2 diabetes Seizures Aspiration pneumonia CONSULTS OBTAINED:  Treatment Team:  Leotis Pain, MD DRUG ALLERGIES:  No Known Allergies DISCHARGE MEDICATIONS:   Allergies as of 04/13/2019   No Known Allergies     Medication List    STOP taking these medications   chlorthalidone 25 MG tablet Commonly known as: HYGROTON   glipiZIDE 10 MG tablet Commonly known as: GLUCOTROL   lisinopril 40 MG tablet Commonly known as: ZESTRIL   metoprolol succinate 50 MG 24 hr tablet Commonly known as: TOPROL-XL   spironolactone 25 MG tablet Commonly known as: ALDACTONE     TAKE these medications   amLODipine 10 MG tablet Commonly known as: NORVASC Take 1 tablet (10 mg total) by mouth daily.   aspirin EC 81 MG tablet Take 1 tablet (81 mg total) by mouth daily.   atorvastatin 20 MG tablet Commonly known as: LIPITOR Take 1 tablet (20 mg total) by mouth daily.   Basaglar KwikPen 100 UNIT/ML Sopn Inject 0.2 mLs (20 Units total) into the skin daily.   gabapentin 100 MG capsule Commonly known as: NEURONTIN Take 1 capsule (100 mg total) by mouth at bedtime.   insulin lispro 100 UNIT/ML injection Commonly known as: HumaLOG Inject 0-0.2 mLs (0-20 Units total) into the skin 4 (four) times daily - after meals and at bedtime. Glucose 150-200-give 3 units subcu Glucose 201-250-give 6units subcu Glucose 251-300-give 9 units subcu Glucose 301-350-give 12 units subcu Glucose 351-400-give 15 units and call primary care provider   labetalol 200 MG tablet Commonly known as: NORMODYNE  Take 1 tablet (200 mg total) by mouth 2 (two) times daily.   levETIRAcetam 250 MG tablet Commonly known as: Keppra Take 1 tablet (250 mg total) by mouth 2 (two) times daily.   losartan 100  MG tablet Commonly known as: Cozaar Take 1 tablet (100 mg total) by mouth daily.   multivitamin Tabs tablet Take 1 tablet by mouth at bedtime.            Durable Medical Equipment  (From admission, onward)         Start     Ordered   04/13/19 1507  For home use only DME Walker rolling  Once    Question:  Patient needs a walker to treat with the following condition  Answer:  Generalized weakness   04/13/19 1507           DISCHARGE INSTRUCTIONS:  1.  Follow-up with PCP in 5 days 2.  Follow-up with nephrology in 1 to 2 weeks 3.  Follow-up with neurology in 2 weeks DIET:  Cardiac diet, Diabetic diet and Renal diet DISCHARGE CONDITION:  Stable ACTIVITY:  Activity as tolerated OXYGEN:  Home Oxygen: No.  Oxygen Delivery: room air DISCHARGE LOCATION:  home   If you experience worsening of your admission symptoms, develop shortness of breath, life threatening emergency, suicidal or homicidal thoughts you must seek medical attention immediately by calling 911 or calling your MD immediately  if symptoms less severe.  You Must read complete instructions/literature along with all the possible adverse reactions/side effects for all the Medicines you take and that have been prescribed to you. Take any new Medicines after you have completely understood and accpet all the possible adverse reactions/side effects.   Please note  You were cared for by a hospitalist during your hospital stay. If you have any questions about your discharge medications or the care you received while you were in the hospital after you are discharged, you can call the unit and asked to speak with the hospitalist on call if the hospitalist that took care of you is not available. Once you are discharged, your primary care physician will handle any further medical issues. Please note that NO REFILLS for any discharge medications will be authorized once you are discharged, as it is imperative that you return to  your primary care physician (or establish a relationship with a primary care physician if you do not have one) for your aftercare needs so that they can reassess your need for medications and monitor your lab values.    On the day of Discharge:  VITAL SIGNS:  Blood pressure (!) 158/88, pulse (!) 101, temperature 98.4 F (36.9 C), temperature source Oral, resp. rate 20, height 5\' 10"  (1.778 m), weight 135.2 kg, SpO2 99 %. PHYSICAL EXAMINATION:  HENT:     Head: Normocephalic.  Eyes:     General: No scleral icterus.    Conjunctiva/sclera: Conjunctivae normal.     Pupils: Pupils are equal, round, and reactive to light.  Neck:     Musculoskeletal: Neck supple.     Vascular: No JVD.     Trachea: No tracheal deviation.  Cardiovascular:     Rate and Rhythm: Normal rate and regular rhythm.     Heart sounds: Normal heart sounds. No murmur. No gallop.   Pulmonary:     Effort: Pulmonary effort is normal. No respiratory distress.     Breath sounds: Normal breath sounds. No wheezing or rales.  Abdominal:     General: Bowel sounds  are normal. There is no distension.     Palpations: Abdomen is soft.     Tenderness: There is no abdominal tenderness. There is no rebound.  Musculoskeletal:        General: No tenderness. +RIJ permcath Skin:    Findings: No erythema or rash.  DATA REVIEW:   CBC No results for input(s): WBC, HGB, HCT, PLT in the last 168 hours.  Chemistries  Recent Labs  Lab 04/07/19 0408  04/13/19 0446  NA 140   < > 136  K 3.5   < > 3.5  CL 101   < > 94*  CO2 20*   < > 25  GLUCOSE 246*   < > 74  BUN 86*   < > 50*  CREATININE 11.49*   < > 11.29*  CALCIUM 8.2*   < > 7.7*  MG 2.0  --   --    < > = values in this interval not displayed.     Microbiology Results  Results for orders placed or performed during the hospital encounter of 04/01/19  Culture, blood (routine x 2)     Status: None   Collection Time: 04/01/19  5:30 AM   Specimen: BLOOD  Result Value Ref  Range Status   Specimen Description BLOOD BLOOD LEFT HAND  Final   Special Requests   Final    BOTTLES DRAWN AEROBIC AND ANAEROBIC Blood Culture results may not be optimal due to an inadequate volume of blood received in culture bottles   Culture   Final    NO GROWTH 5 DAYS Performed at [facility], [address], [place], [place] [address]    Report Status 04/06/2019 FINAL  Final  Urine culture     Status: None   Collection Time: 04/01/19  5:30 AM   Specimen: Urine, Random  Result Value Ref Range Status   Specimen Description   Final    URINE, RANDOM Performed at [facility], [address], [place], [place] [address]    Special Requests   Final    NONE Performed at [facility], [address], [place], [place] [address]    Culture   Final    NO GROWTH Performed at [facility], [place] [address] [place], [place] [address]    Report Status 04/02/2019 FINAL  Final  SARS Coronavirus 2 (CEPHEID - Performed in [place] hospital lab), Hosp Order     Status: None   Collection Time: 04/01/19  5:30 AM   Specimen: Nasopharyngeal Swab  Result Value Ref Range Status   SARS Coronavirus 2 NEGATIVE NEGATIVE Final    Comment: (NOTE) If result is NEGATIVE SARS-CoV-2 target nucleic acids are NOT DETECTED. The SARS-CoV-2 RNA is generally detectable in upper and lower  respiratory specimens during the acute phase of infection. The lowest  concentration of SARS-CoV-2 viral copies this assay can detect is 250  copies / mL. A negative result does not preclude SARS-CoV-2 infection  and should not be used as the sole basis for treatment or other  patient management decisions.  A negative result may occur with  improper specimen collection / handling, submission of specimen other  than nasopharyngeal swab, presence of viral mutation(s) within the  areas targeted by this assay, and inadequate number of viral copies  (<250 copies / mL). A negative result must  be combined with clinical  observations, patient history, and epidemiological information. If result is POSITIVE SARS-CoV-2 target nucleic acids are DETECTED. The  SARS-CoV-2 RNA is generally detectable in upper and lower  respiratory specimens dur ing the acute phase of infection.  Positive  results are indicative of active infection with SARS-CoV-2.  Clinical  correlation with patient history and other diagnostic information is  necessary to determine patient infection status.  Positive results do  not rule out bacterial infection or co-infection with other viruses. If result is PRESUMPTIVE POSTIVE SARS-CoV-2 nucleic acids MAY BE PRESENT.   A presumptive positive result was obtained on the submitted specimen  and confirmed on repeat testing.  While 2019 novel coronavirus  (SARS-CoV-2) nucleic acids may be present in the submitted sample  additional confirmatory testing may be necessary for epidemiological  and / or clinical management purposes  to differentiate between  SARS-CoV-2 and other Sarbecovirus currently known to infect humans.  If clinically indicated additional testing with an alternate test  methodology [phone]) is advised. The SARS-CoV-2 RNA is generally  detectable in upper and lower respiratory sp ecimens during the acute  phase of infection. The expected result is Negative. Fact Sheet for Patients:  StrictlyIdeas.no Fact Sheet for Healthcare Providers: BankingDealers.co.za This test is not yet approved or cleared by the [place] FDA and has been authorized for detection and/or diagnosis of SARS-CoV-2 by FDA under an Emergency Use Authorization (EUA).  This EUA will remain in effect (meaning this test can be used) for the duration of the COVID-19 declaration under Section 564(b)(1) of the Act, 21 U.S.C. section 360bbb-3(b)(1), unless the authorization is terminated or revoked sooner. Performed at [facility], [address] [place], [place] [address]   Culture, blood (routine x 2)     Status: None   Collection Time: 04/01/19  6:49 AM   Specimen: BLOOD  Result Value Ref Range Status   Specimen Description BLOOD BLOOD RIGHT HAND  Final   Special Requests   Final    BOTTLES DRAWN AEROBIC AND ANAEROBIC Blood Culture results may not be optimal due to an excessive volume of blood received in culture bottles   Culture   Final    NO GROWTH 5 DAYS Performed at [facility], [address] [place], [place] [address]    Report Status 04/06/2019 FINAL  Final  MRSA PCR Screening     Status: None   Collection Time: 04/01/19 10:58 AM   Specimen: Nasal Mucosa; Nasopharyngeal  Result Value Ref Range Status   MRSA by PCR NEGATIVE NEGATIVE Final    Comment:        The GeneXpert MRSA Assay (FDA approved for NASAL specimens only), is one component of a comprehensive MRSA colonization surveillance program. It is not intended to diagnose MRSA infection nor to guide or monitor treatment for MRSA infections. Performed at [facility], [address] [place], [place] [address]   Culture, respiratory (non-expectorated)     Status: None   Collection Time: 04/01/19 12:50 PM   Specimen: Tracheal Aspirate; Respiratory  Result Value Ref Range Status   Specimen Description   Final    TRACHEAL ASPIRATE Performed at [facility], [address] [place], [place] [address]    Special Requests   Final    NONE Performed at [facility], [address] [place], [place] [address]    Gram Stain   Final    MODERATE WBC PRESENT, PREDOMINANTLY PMN FEW SQUAMOUS EPITHELIAL CELLS PRESENT ABUNDANT GRAM POSITIVE COCCI RARE GRAM POSITIVE RODS Performed at [facility], [place] [address] [place], [place] [address]  Culture FEW METHICILLIN RESISTANT STAPHYLOCOCCUS AUREUS  Final   Report Status 04/04/2019 FINAL  Final   Organism ID, Bacteria METHICILLIN RESISTANT  STAPHYLOCOCCUS AUREUS  Final      Susceptibility   Methicillin resistant staphylococcus aureus - MIC*    CIPROFLOXACIN <=0.5 SENSITIVE Sensitive     ERYTHROMYCIN <=0.25 SENSITIVE Sensitive     GENTAMICIN <=0.5 SENSITIVE Sensitive     OXACILLIN >=4 RESISTANT Resistant     TETRACYCLINE <=1 SENSITIVE Sensitive     VANCOMYCIN <=0.5 SENSITIVE Sensitive     TRIMETH/SULFA <=10 SENSITIVE Sensitive     CLINDAMYCIN <=0.25 SENSITIVE Sensitive     RIFAMPIN <=0.5 SENSITIVE Sensitive     Inducible Clindamycin NEGATIVE Sensitive     * FEW METHICILLIN RESISTANT STAPHYLOCOCCUS AUREUS  C difficile quick scan w PCR reflex     Status: None   Collection Time: 04/04/19  9:07 PM   Specimen: STOOL  Result Value Ref Range Status   C Diff antigen NEGATIVE NEGATIVE Final   C Diff toxin NEGATIVE NEGATIVE Final   C Diff interpretation No C. difficile detected.  Final    Comment: Performed at [facility], [address] [place], [place] [address]    RADIOLOGY:  Dg Elbow 2 Views Right  Result Date: 04/13/2019 CLINICAL DATA:  Inpatient with right elbow swelling. No acute injury. EXAM: RIGHT ELBOW - 2 VIEW COMPARISON:  None. FINDINGS: The mineralization and alignment are normal. There is no evidence of acute fracture or dislocation. The joint spaces are preserved. There is no elbow joint effusion. Mild spurring of the olecranon process is noted. There is subcutaneous edema posteromedially without evidence of foreign body or soft tissue emphysema. IMPRESSION: Subcutaneous edema posteromedially without foreign body. No acute osseous findings. Electronically Signed   By: Richardean Sale M.D.   On: 04/13/2019 13:08     Management plans discussed with the patient, family and they are in agreement.  CODE STATUS: Full Code   TOTAL TIME TAKING CARE OF THIS PATIENT: 35 minutes.    Berna Spare Leeland Lovelady M.D on 04/13/2019 at 3:22 PM  Between 7am to 6pm - Pager [phone]  After 6pm go to [email or web address] - Microbiologist  [facility]  Office  [phone]  CC: Primary care physician; Gennaro Africa, MD   Note: This dictation was prepared with Dragon dictation along with smaller phrase technology. Any transcriptional errors that result from this process are unintentional.

## 2019-04-13 NOTE — Progress Notes (Signed)
Inpatient Diabetes Program Recommendations  AACE/ADA: New Consensus Statement on Inpatient Glycemic Control   Target Ranges:  Prepandial:   less than 140 mg/dL      [place] postprandial:   less than 180 mg/dL (1-2 hours)      Critically ill patients:  140 - 180 mg/dL   Results for ZADEN, SAKO (MRN [id number]) as of 04/13/2019 08:52  Ref. Range 04/12/2019 08:17 04/12/2019 08:41 04/12/2019 09:03 04/12/2019 13:30 04/12/2019 17:00 04/12/2019 21:19 04/13/2019 07:55 04/13/2019 08:23  Glucose-Capillary Latest Ref Range: 70 - 99 mg/dL 68 (L) 69 (L) 103 (H) 73 103 (H) 114 (H)  Levemir 22 units @ 21:22 63 (L) 83  Results for ELIESER, TETRICK (MRN [id number]) as of 04/13/2019 08:52  Ref. Range 04/01/2019 12:04  Hemoglobin A1C Latest Ref Range: 4.8 - 5.6 % 14.8 (H)   Review of Glycemic Control  Diabetes history: DM2 Outpatient Diabetes medications: Glipizide 10 mg BID Current orders for Inpatient glycemic control: Levemir 22 units BID, Novolog 0-15 units TID with meals, Novolog 0-5 units QHS  Inpatient Diabetes Program Recommendations:   Insulin - Basal: In reviewing chart, noted patient did NOT receive morning dose of Levemir on 04/12/19 (per [facility] held due to pt being in hemodialysis) but did receive Levemir 22 units at 21:22 on 6/23. Fasting glucose 63 mg/dl this morning. Please consider decreasing Levemir to 20 units BID.   Thanks, Barnie Alderman, RN, MSN, CDE Diabetes Coordinator Inpatient Diabetes Program [phone] (Team Pager from 8am to 5pm)

## 2019-04-13 NOTE — Progress Notes (Signed)
[facility]  ROUNDING NOTE   Subjective:   Patient is doing fairly well No complaint of shortness of breath Reports pain and swelling in his right elbow    Objective:  Vital signs in last 24 hours:  Temp:  [98.4 F (36.9 C)-99.2 F (37.3 C)] 98.4 F (36.9 C) (06/24 1131) Pulse Rate:  [93-101] 101 (06/24 1131) Resp:  [18-20] 20 (06/24 1131) BP: (125-158)/(72-88) 158/88 (06/24 1131) SpO2:  [96 %-99 %] 99 % (06/24 1131)  Weight change:  Filed Weights   04/09/19 1509 04/11/19 0505 04/12/19 0500  Weight: (!) 138.3 kg (!) 138.8 kg 135.2 kg    Intake/Output: I/O last 3 completed shifts: In: 840 [P.O.:840] Out: 1250 [Urine:250; Other:1000]   Intake/Output this shift:  No intake/output data recorded.  Physical Exam: General: Laying in bed  Head: Moist mucosal membranes  Eyes: Anicteric  Neck:  Supple, no masses  Lungs:  Clear   Heart: regular  Abdomen:  Soft, obese,   Extremities: No peripheral edema.  Tenderness over right elbow and surrounding tissue  Neurologic: Alert and oriented.   Skin: No lesions  Access:  RIJ permcath 6/18.      Basic Metabolic Panel: Recent Labs  Lab 04/07/19 0408 04/08/19 1147 04/09/19 0946 04/10/19 0437 04/11/19 0641 04/12/19 0430 04/13/19 0446  NA 140 135 135 134* 138 138 136  K 3.5 3.3* 3.7 3.7 3.9 4.6 3.5  CL 101 95* 97* 94* 97* 94* 94*  CO2 20* 21* 20* 23 21* 21* 25  GLUCOSE 246* 228* 193* 130* 84 89 74  BUN 86* 69* 82* 58* 75* 85* 50*  CREATININE 11.49* 10.35* 13.36* 10.41* 13.97* 16.04* 11.29*  CALCIUM 8.2* 8.2* 8.1* 8.1* 8.4* 8.2* 7.7*  MG 2.0  --   --   --   --   --   --   PHOS  --  7.2*  --   --   --   --   --     Liver Function Tests: Recent Labs  Lab 04/08/19 1147  ALBUMIN 2.9*   No results for input(s): LIPASE, AMYLASE in the last 168 hours. No results for input(s): AMMONIA in the last 168 hours.  CBC: No results for input(s): WBC, NEUTROABS, HGB, HCT, MCV, PLT in the last 168  hours.  Cardiac Enzymes: No results for input(s): CKTOTAL, CKMB, CKMBINDEX, TROPONINI in the last 168 hours.  BNP: Invalid input(s): POCBNP  CBG: Recent Labs  Lab 04/12/19 1700 04/12/19 2119 04/13/19 0755 04/13/19 0823 04/13/19 1132  GLUCAP 103* 114* 73* 83 104*    Microbiology: Results for orders placed or performed during the hospital encounter of 04/01/19  Culture, blood (routine x 2)     Status: None   Collection Time: 04/01/19  5:30 AM   Specimen: BLOOD  Result Value Ref Range Status   Specimen Description BLOOD BLOOD LEFT HAND  Final   Special Requests   Final    BOTTLES DRAWN AEROBIC AND ANAEROBIC Blood Culture results may not be optimal due to an inadequate volume of blood received in culture bottles   Culture   Final    NO GROWTH 5 DAYS Performed at [facility], [address], [place], [place] [address]    Report Status 04/06/2019 FINAL  Final  Urine culture     Status: None   Collection Time: 04/01/19  5:30 AM   Specimen: Urine, Random  Result Value Ref Range Status   Specimen Description   Final    URINE,  RANDOM Performed at [facility], [address], [place], [place] [address]    Special Requests   Final    NONE Performed at [facility], [address] [place], [place] [address]    Culture   Final    NO GROWTH Performed at [facility], [address], [place], [place] [address]    Report Status 04/02/2019 FINAL  Final  SARS Coronavirus 2 (CEPHEID - Performed in [place] hospital lab), Hosp Order     Status: None   Collection Time: 04/01/19  5:30 AM   Specimen: Nasopharyngeal Swab  Result Value Ref Range Status   SARS Coronavirus 2 NEGATIVE NEGATIVE Final    Comment: (NOTE) If result is NEGATIVE SARS-CoV-2 target nucleic acids are NOT DETECTED. The SARS-CoV-2 RNA is generally detectable in upper and lower  respiratory specimens during the acute phase of infection. The lowest  concentration of  SARS-CoV-2 viral copies this assay can detect is 250  copies / mL. A negative result does not preclude SARS-CoV-2 infection  and should not be used as the sole basis for treatment or other  patient management decisions.  A negative result may occur with  improper specimen collection / handling, submission of specimen other  than nasopharyngeal swab, presence of viral mutation(s) within the  areas targeted by this assay, and inadequate number of viral copies  (<250 copies / mL). A negative result must be combined with clinical  observations, patient history, and epidemiological information. If result is POSITIVE SARS-CoV-2 target nucleic acids are DETECTED. The SARS-CoV-2 RNA is generally detectable in upper and lower  respiratory specimens dur ing the acute phase of infection.  Positive  results are indicative of active infection with SARS-CoV-2.  Clinical  correlation with patient history and other diagnostic information is  necessary to determine patient infection status.  Positive results do  not rule out bacterial infection or co-infection with other viruses. If result is PRESUMPTIVE POSTIVE SARS-CoV-2 nucleic acids MAY BE PRESENT.   A presumptive positive result was obtained on the submitted specimen  and confirmed on repeat testing.  While 2019 novel coronavirus  (SARS-CoV-2) nucleic acids may be present in the submitted sample  additional confirmatory testing may be necessary for epidemiological  and / or clinical management purposes  to differentiate between  SARS-CoV-2 and other Sarbecovirus currently known to infect humans.  If clinically indicated additional testing with an alternate test  methodology [phone]) is advised. The SARS-CoV-2 RNA is generally  detectable in upper and lower respiratory sp ecimens during the acute  phase of infection. The expected result is Negative. Fact Sheet for Patients:  StrictlyIdeas.no Fact Sheet for Healthcare  Providers: BankingDealers.co.za This test is not yet approved or cleared by the [place] FDA and has been authorized for detection and/or diagnosis of SARS-CoV-2 by FDA under an Emergency Use Authorization (EUA).  This EUA will remain in effect (meaning this test can be used) for the duration of the COVID-19 declaration under Section 564(b)(1) of the Act, 21 U.S.C. section 360bbb-3(b)(1), unless the authorization is terminated or revoked sooner. Performed at [facility], [address] [place], [place] [address]   Culture, blood (routine x 2)     Status: None   Collection Time: 04/01/19  6:49 AM   Specimen: BLOOD  Result Value Ref Range Status   Specimen Description BLOOD BLOOD RIGHT HAND  Final   Special Requests   Final    BOTTLES DRAWN AEROBIC AND ANAEROBIC Blood Culture  results may not be optimal due to an excessive volume of blood received in culture bottles   Culture   Final    NO GROWTH 5 DAYS Performed at [facility], [address] [place], [place] [address]    Report Status 04/06/2019 FINAL  Final  MRSA PCR Screening     Status: None   Collection Time: 04/01/19 10:58 AM   Specimen: Nasal Mucosa; Nasopharyngeal  Result Value Ref Range Status   MRSA by PCR NEGATIVE NEGATIVE Final    Comment:        The GeneXpert MRSA Assay (FDA approved for NASAL specimens only), is one component of a comprehensive MRSA colonization surveillance program. It is not intended to diagnose MRSA infection nor to guide or monitor treatment for MRSA infections. Performed at [facility], [address] [place], [place] [address]   Culture, respiratory (non-expectorated)     Status: None   Collection Time: 04/01/19 12:50 PM   Specimen: Tracheal Aspirate; Respiratory  Result Value Ref Range Status   Specimen Description   Final    TRACHEAL ASPIRATE Performed at [facility], [address] [place], [place] [address]     Special Requests   Final    NONE Performed at [facility], [address] [place], [place] [address]    Gram Stain   Final    MODERATE WBC PRESENT, PREDOMINANTLY PMN FEW SQUAMOUS EPITHELIAL CELLS PRESENT ABUNDANT GRAM POSITIVE COCCI RARE GRAM POSITIVE RODS Performed at [facility], [place] [address], [place], [place] [address]    Culture FEW METHICILLIN RESISTANT STAPHYLOCOCCUS AUREUS  Final   Report Status 04/04/2019 FINAL  Final   Organism ID, Bacteria METHICILLIN RESISTANT STAPHYLOCOCCUS AUREUS  Final      Susceptibility   Methicillin resistant staphylococcus aureus - MIC*    CIPROFLOXACIN <=0.5 SENSITIVE Sensitive     ERYTHROMYCIN <=0.25 SENSITIVE Sensitive     GENTAMICIN <=0.5 SENSITIVE Sensitive     OXACILLIN >=4 RESISTANT Resistant     TETRACYCLINE <=1 SENSITIVE Sensitive     VANCOMYCIN <=0.5 SENSITIVE Sensitive     TRIMETH/SULFA <=10 SENSITIVE Sensitive     CLINDAMYCIN <=0.25 SENSITIVE Sensitive     RIFAMPIN <=0.5 SENSITIVE Sensitive     Inducible Clindamycin NEGATIVE Sensitive     * FEW METHICILLIN RESISTANT STAPHYLOCOCCUS AUREUS  C difficile quick scan w PCR reflex     Status: None   Collection Time: 04/04/19  9:07 PM   Specimen: STOOL  Result Value Ref Range Status   C Diff antigen NEGATIVE NEGATIVE Final   C Diff toxin NEGATIVE NEGATIVE Final   C Diff interpretation No C. difficile detected.  Final    Comment: Performed at [facility], [address] [place], [place] [address]    Coagulation Studies: No results for input(s): LABPROT, INR in the last 72 hours.  Urinalysis: No results for input(s): COLORURINE, LABSPEC, PHURINE, GLUCOSEU, HGBUR, BILIRUBINUR, KETONESUR, PROTEINUR, UROBILINOGEN, NITRITE, LEUKOCYTESUR in the last 72 hours.  Invalid input(s): APPERANCEUR    Imaging: Dg Chest 2 View  Result Date: 04/11/2019 CLINICAL DATA:  Patient admitted 10 days ago with end-stage renal disease, new onset seizures and sepsis. EXAM:  CHEST - 2 VIEW COMPARISON:  Single-view of the chest 04/03/2019. FINDINGS: Right IJ approach dialysis catheter is in place with the tip in the lower superior vena cava. No pneumothorax. Support apparatus is otherwise been removed since the prior exam. Lungs are clear. Heart size is normal. No pneumothorax or  pleural fluid. No acute or focal bony abnormality. IMPRESSION: No acute disease. Dialysis catheter tip is in the lower superior vena cava. Electronically Signed   By: Inge Rise M.D.   On: 04/11/2019 17:10   Dg Elbow 2 Views Right  Result Date: 04/13/2019 CLINICAL DATA:  Inpatient with right elbow swelling. No acute injury. EXAM: RIGHT ELBOW - 2 VIEW COMPARISON:  None. FINDINGS: The mineralization and alignment are normal. There is no evidence of acute fracture or dislocation. The joint spaces are preserved. There is no elbow joint effusion. Mild spurring of the olecranon process is noted. There is subcutaneous edema posteromedially without evidence of foreign body or soft tissue emphysema. IMPRESSION: Subcutaneous edema posteromedially without foreign body. No acute osseous findings. Electronically Signed   By: Richardean Sale M.D.   On: 04/13/2019 13:08     Medications:    . amitriptyline  25 mg Oral QHS  . amLODipine  10 mg Oral Daily  . Chlorhexidine Gluconate Cloth  6 each Topical Daily  . epoetin (EPOGEN/PROCRIT) injection  10,000 Units Intravenous Q T,Th,Sa-HD  . feeding supplement (NEPRO CARB STEADY)  237 mL Oral BID BM  . gabapentin  100 mg Oral QHS  . insulin aspart  0-15 Units Subcutaneous TID WC  . insulin aspart  0-5 Units Subcutaneous QHS  . insulin detemir  20 Units Subcutaneous BID  . irbesartan  300 mg Oral Daily  . labetalol  200 mg Oral BID  . levETIRAcetam  500 mg Oral q1800  . multivitamin  1 tablet Oral QHS  . sodium chloride flush  3 mL Intravenous Q12H   acetaminophen, albuterol, cyclobenzaprine, diphenhydrAMINE, heparin, hydrALAZINE, labetalol,  methylPREDNISolone (SOLU-MEDROL) injection, midazolam, ondansetron (ZOFRAN) IV, ondansetron **OR** [DISCONTINUED] ondansetron (ZOFRAN) IV  Assessment/ Plan:    Mr. Roy Oconnell is a 43 y.o. black male with diabetes mellitus type 2, hypertension, hyperlipidemia, prior history of kidney donation, chronic kidney disease stage III baseline creatinine 1.5 on August 27, 2018, who was admitted to [facility] on 04/01/2019 for evaluation of seizures and loss of consciousness.  1.  ESRD.  Chronic kidney disease stage with proteinuria is likely Secondary to solitary kidney, hypertension and diabetic nephropathy. Severe prolonged oliguric AKI may have progressed to ESRD Requiring renal replacement therapy.  CRRT stopped on 6/15. Intermittent hemodialysis on 6/16, 6/18, 6/20. Permcath placed on on 6/18.   -  outpatient d/c planning in progress for [place] [facility].  Patient will be followed by [facility] nephrology.   2.  Diabetes mellitus type II with chronic kidney disease: noninsulin dependent prior to admission. Hemoglobin A1c of 14.8% - uncontrolled. Patient reports he was not able to afford his medications.  Due to poor diabetic control, suspect renal recovery to be less likely.   3.  Anemia with chronic kidney disease:   - EPO with HD treatment TTS Lab Results  Component Value Date   HGB 9.7 (L) 04/05/2019    4. Seizures: -  levitiracetam.          LOS: 12 Favio Moder 6/24/20203:52 PM

## 2019-04-13 NOTE — Progress Notes (Signed)
Patient choose [facility] [place], pending insurance verification and Therapist, art.   Elvera Bicker Dialysis Coordinator  [phone]

## 2019-04-13 NOTE — TOC Progression Note (Signed)
Transition of Care [facility]) - Progression Note    Patient Details  Name: Roy Oconnell MRN: [id number] Date of Birth: 1975-11-19  [facility] CM/SW Contact  Beverly Sessions, RN Phone Number: 04/13/2019, 4:49 PM  Clinical Narrative:    RNCM faxed all perscriptions for discharge to Medication Management .  All will be availbalbe at no cost to patient, and will be delivered to bedside prior to discharge.  Patient notified.  RNCM to provide patient with application to Medication Management   Anticipated that patient will go to [facility] [address] TTS 11 am pending verification  Patient confirms that he will have transportation to HD.   Patient has a PCP follow up appointment scheduled for Monday 6/29  PT has assessed patient and recommends home health PT and RW.  Patient agreeable.  [place] referral made to [facility] with [place].  Brad with [employer] to deliver charity walker to room tomorrow prior to discharge   Expected Discharge Plan: Home/Self Care Barriers to Discharge: Continued Medical Work up  Expected Discharge Plan and Services Expected Discharge Plan: Home/Self Care   Discharge Planning Services: CM Consult, Medication Assistance, [place] Acute Care Choice: Dialysis   Expected Discharge Date: 04/13/19                                     Social Determinants of Health (SDOH) Interventions    Readmission Risk Interventions No flowsheet data found.

## 2019-04-14 LAB — GLUCOSE, CAPILLARY
Glucose-Capillary: 78 mg/dL (ref 70–99)
Glucose-Capillary: 116 mg/dL — ABNORMAL HIGH (ref 70–99)
Glucose-Capillary: 78 mg/dL (ref 70–99)
Glucose-Capillary: 114 mg/dL — ABNORMAL HIGH (ref 70–99)

## 2019-04-14 MED ORDER — BASAGLAR KWIKPEN 100 UNIT/ML ~~LOC~~ SOPN: 15.0000 [IU] | PEN_INJECTOR | Freq: Every day | SUBCUTANEOUS | 0 refills | Status: DC

## 2019-04-14 MED ORDER — OXYCODONE HCL 5 MG PO TABS
5.0000 mg | ORAL_TABLET | Freq: Once | ORAL | Status: AC
  Administered 2019-04-14: 5 mg via ORAL
  Filled 2019-04-14: qty 1

## 2019-04-14 MED ORDER — DICLOFENAC SODIUM 1 % TD GEL
2.0000 g | Freq: Four times a day (QID) | TRANSDERMAL | Status: DC
  Administered 2019-04-14 – 2019-04-15 (×3): 2 g via TOPICAL
  Filled 2019-04-14: qty 100

## 2019-04-14 MED ORDER — INSULIN DETEMIR 100 UNIT/ML ~~LOC~~ SOLN
15.0000 [IU] | Freq: Every day | SUBCUTANEOUS | Status: DC
  Administered 2019-04-14: 21:00:00 15 [IU] via SUBCUTANEOUS
  Filled 2019-04-14 (×2): qty 0.15

## 2019-04-14 NOTE — Progress Notes (Signed)
Pre HD Tx    04/14/19 1230  Neurological  Level of Consciousness Alert  Orientation Level Oriented X4  Respiratory  Respiratory Pattern Regular;Unlabored  Chest Assessment Chest expansion symmetrical  Bilateral Breath Sounds Diminished  Cardiac  Pulse Regular  Heart Sounds S1, S2  Jugular Venous Distention (JVD) No  ECG Monitor Yes  Antiarrhythmic device No  Vascular  R Radial Pulse +2  L Radial Pulse +2  Edema Generalized  Generalized Edema +1  RUE Edema Non-pitting  LUE Edema Non-pitting  RLE Edema Non-pitting  LLE Edema Non-Pitting  Integumentary  Integumentary (WDL) WDL  Skin Color Appropriate for ethnicity  Skin Condition Dry  Additional Integumentary Comments HD pt /HD cath  Musculoskeletal  Musculoskeletal (WDL) WDL  Gastrointestinal  Bowel Sounds Assessment Active  GU Assessment  Genitourinary (WDL) X  Genitourinary Symptoms Oliguria

## 2019-04-14 NOTE — Progress Notes (Signed)
[facility]  ROUNDING NOTE   Subjective:   Patient is doing fairly well No complaint of shortness of breath Seen during HD   HEMODIALYSIS FLOWSHEET:  Blood Flow Rate (mL/min): 400 mL/min Arterial Pressure (mmHg): -190 mmHg Venous Pressure (mmHg): 180 mmHg Transmembrane Pressure (mmHg): 60 mmHg Ultrafiltration Rate (mL/min): 470 mL/min Dialysate Flow Rate (mL/min): 600 ml/min Conductivity: Machine : 14 Conductivity: Machine : 14 Dialysis Fluid Bolus: Normal Saline Bolus Amount (mL): 250 mL Dialysate Change: 2K       Objective:  Vital signs in last 24 hours:  Temp:  [98.4 F (36.9 C)-98.6 F (37 C)] 98.6 F (37 C) (06/25 0542) Pulse Rate:  [90-101] 90 (06/25 0542) Resp:  [20] 20 (06/25 0542) BP: (129-158)/(80-88) 129/80 (06/25 0542) SpO2:  [97 %-99 %] 97 % (06/25 0542)  Weight change:  Filed Weights   04/09/19 1509 04/11/19 0505 04/12/19 0500  Weight: (!) 138.3 kg (!) 138.8 kg 135.2 kg    Intake/Output: I/O last 3 completed shifts: In: 360 [P.O.:360] Out: 250 [Urine:250]   Intake/Output this shift:  No intake/output data recorded.  Physical Exam: General: Laying in bed  Head: Moist mucosal membranes  Eyes: Anicteric  Neck:  Supple, no masses  Lungs:  Clear to auscultation,    Heart: regular  Abdomen:  Soft, obese,   Extremities: No edema  Neurologic: Alert and oriented.   Skin: No lesions  Access:  RIJ permcath 6/18.      Basic Metabolic Panel: Recent Labs  Lab 04/08/19 1147 04/09/19 0946 04/10/19 0437 04/11/19 0641 04/12/19 0430 04/13/19 0446  NA 135 135 134* 138 138 136  K 3.3* 3.7 3.7 3.9 4.6 3.5  CL 95* 97* 94* 97* 94* 94*  CO2 21* 20* 23 21* 21* 25  GLUCOSE 228* 193* 130* 84 89 74  BUN 69* 82* 58* 75* 85* 50*  CREATININE 10.35* 13.36* 10.41* 13.97* 16.04* 11.29*  CALCIUM 8.2* 8.1* 8.1* 8.4* 8.2* 7.7*  PHOS 7.2*  --   --   --   --   --     Liver Function Tests: Recent Labs  Lab 04/08/19 1147  ALBUMIN 2.9*    No results for input(s): LIPASE, AMYLASE in the last 168 hours. No results for input(s): AMMONIA in the last 168 hours.  CBC: No results for input(s): WBC, NEUTROABS, HGB, HCT, MCV, PLT in the last 168 hours.  Cardiac Enzymes: No results for input(s): CKTOTAL, CKMB, CKMBINDEX, TROPONINI in the last 168 hours.  BNP: Invalid input(s): POCBNP  CBG: Recent Labs  Lab 04/13/19 0823 04/13/19 1132 04/13/19 1645 04/13/19 2130 04/14/19 0751  GLUCAP 83 104* 77 94 74    Microbiology: Results for orders placed or performed during the hospital encounter of 04/01/19  Culture, blood (routine x 2)     Status: None   Collection Time: 04/01/19  5:30 AM   Specimen: BLOOD  Result Value Ref Range Status   Specimen Description BLOOD BLOOD LEFT HAND  Final   Special Requests   Final    BOTTLES DRAWN AEROBIC AND ANAEROBIC Blood Culture results may not be optimal due to an inadequate volume of blood received in culture bottles   Culture   Final    NO GROWTH 5 DAYS Performed at [facility], [address] [place], [place] [address]    Report Status 04/06/2019 FINAL  Final  Urine culture     Status: None   Collection Time: 04/01/19  5:30 AM   Specimen: Urine, Random  Result  Value Ref Range Status   Specimen Description   Final    URINE, RANDOM Performed at [facility], [address], [place], [place] [address]    Special Requests   Final    NONE Performed at [facility], [address], [place], [place] [address]    Culture   Final    NO GROWTH Performed at [facility], [place] [address] [place], [place] [address]    Report Status 04/02/2019 FINAL  Final  SARS Coronavirus 2 (CEPHEID - Performed in [place] hospital lab), Hosp Order     Status: None   Collection Time: 04/01/19  5:30 AM   Specimen: Nasopharyngeal Swab  Result Value Ref Range Status   SARS Coronavirus 2 NEGATIVE NEGATIVE Final    Comment: (NOTE) If result is  NEGATIVE SARS-CoV-2 target nucleic acids are NOT DETECTED. The SARS-CoV-2 RNA is generally detectable in upper and lower  respiratory specimens during the acute phase of infection. The lowest  concentration of SARS-CoV-2 viral copies this assay can detect is 250  copies / mL. A negative result does not preclude SARS-CoV-2 infection  and should not be used as the sole basis for treatment or other  patient management decisions.  A negative result may occur with  improper specimen collection / handling, submission of specimen other  than nasopharyngeal swab, presence of viral mutation(s) within the  areas targeted by this assay, and inadequate number of viral copies  (<250 copies / mL). A negative result must be combined with clinical  observations, patient history, and epidemiological information. If result is POSITIVE SARS-CoV-2 target nucleic acids are DETECTED. The SARS-CoV-2 RNA is generally detectable in upper and lower  respiratory specimens dur ing the acute phase of infection.  Positive  results are indicative of active infection with SARS-CoV-2.  Clinical  correlation with patient history and other diagnostic information is  necessary to determine patient infection status.  Positive results do  not rule out bacterial infection or co-infection with other viruses. If result is PRESUMPTIVE POSTIVE SARS-CoV-2 nucleic acids MAY BE PRESENT.   A presumptive positive result was obtained on the submitted specimen  and confirmed on repeat testing.  While 2019 novel coronavirus  (SARS-CoV-2) nucleic acids may be present in the submitted sample  additional confirmatory testing may be necessary for epidemiological  and / or clinical management purposes  to differentiate between  SARS-CoV-2 and other Sarbecovirus currently known to infect humans.  If clinically indicated additional testing with an alternate test  methodology [phone]) is advised. The SARS-CoV-2 RNA is generally  detectable  in upper and lower respiratory sp ecimens during the acute  phase of infection. The expected result is Negative. Fact Sheet for Patients:  StrictlyIdeas.no Fact Sheet for Healthcare Providers: BankingDealers.co.za This test is not yet approved or cleared by the [place] FDA and has been authorized for detection and/or diagnosis of SARS-CoV-2 by FDA under an Emergency Use Authorization (EUA).  This EUA will remain in effect (meaning this test can be used) for the duration of the COVID-19 declaration under Section 564(b)(1) of the Act, 21 U.S.C. section 360bbb-3(b)(1), unless the authorization is terminated or revoked sooner. Performed at [facility], [address] [place], [place] [address]   Culture, blood (routine x 2)     Status: None   Collection Time: 04/01/19  6:49 AM   Specimen: BLOOD  Result Value Ref Range Status   Specimen Description BLOOD BLOOD RIGHT HAND  Final  Special Requests   Final    BOTTLES DRAWN AEROBIC AND ANAEROBIC Blood Culture results may not be optimal due to an excessive volume of blood received in culture bottles   Culture   Final    NO GROWTH 5 DAYS Performed at [facility], [address] [place], [place] [address]    Report Status 04/06/2019 FINAL  Final  MRSA PCR Screening     Status: None   Collection Time: 04/01/19 10:58 AM   Specimen: Nasal Mucosa; Nasopharyngeal  Result Value Ref Range Status   MRSA by PCR NEGATIVE NEGATIVE Final    Comment:        The GeneXpert MRSA Assay (FDA approved for NASAL specimens only), is one component of a comprehensive MRSA colonization surveillance program. It is not intended to diagnose MRSA infection nor to guide or monitor treatment for MRSA infections. Performed at [facility], [address] [place], [place] [address]   Culture, respiratory (non-expectorated)     Status: None   Collection Time: 04/01/19 12:50 PM    Specimen: Tracheal Aspirate; Respiratory  Result Value Ref Range Status   Specimen Description   Final    TRACHEAL ASPIRATE Performed at [facility], [address] [place], [place] [address]    Special Requests   Final    NONE Performed at [facility], [address] [place], [place] [address]    Gram Stain   Final    MODERATE WBC PRESENT, PREDOMINANTLY PMN FEW SQUAMOUS EPITHELIAL CELLS PRESENT ABUNDANT GRAM POSITIVE COCCI RARE GRAM POSITIVE RODS Performed at [facility], [place] [address] [place], [place] [address]    Culture FEW METHICILLIN RESISTANT STAPHYLOCOCCUS AUREUS  Final   Report Status 04/04/2019 FINAL  Final   Organism ID, Bacteria METHICILLIN RESISTANT STAPHYLOCOCCUS AUREUS  Final      Susceptibility   Methicillin resistant staphylococcus aureus - MIC*    CIPROFLOXACIN <=0.5 SENSITIVE Sensitive     ERYTHROMYCIN <=0.25 SENSITIVE Sensitive     GENTAMICIN <=0.5 SENSITIVE Sensitive     OXACILLIN >=4 RESISTANT Resistant     TETRACYCLINE <=1 SENSITIVE Sensitive     VANCOMYCIN <=0.5 SENSITIVE Sensitive     TRIMETH/SULFA <=10 SENSITIVE Sensitive     CLINDAMYCIN <=0.25 SENSITIVE Sensitive     RIFAMPIN <=0.5 SENSITIVE Sensitive     Inducible Clindamycin NEGATIVE Sensitive     * FEW METHICILLIN RESISTANT STAPHYLOCOCCUS AUREUS  C difficile quick scan w PCR reflex     Status: None   Collection Time: 04/04/19  9:07 PM   Specimen: STOOL  Result Value Ref Range Status   C Diff antigen NEGATIVE NEGATIVE Final   C Diff toxin NEGATIVE NEGATIVE Final   C Diff interpretation No C. difficile detected.  Final    Comment: Performed at [facility], [address] [place],  [address]    Coagulation Studies: No results for input(s): LABPROT, INR in the last 72 hours.  Urinalysis: No results for input(s): COLORURINE, LABSPEC, PHURINE, GLUCOSEU, HGBUR, BILIRUBINUR, KETONESUR, PROTEINUR, UROBILINOGEN, NITRITE, LEUKOCYTESUR in the last  72 hours.  Invalid input(s): APPERANCEUR    Imaging: Dg Elbow 2 Views Right  Result Date: 04/13/2019 CLINICAL DATA:  Inpatient with right elbow swelling. No acute injury. EXAM: RIGHT ELBOW - 2 VIEW COMPARISON:  None. FINDINGS: The mineralization and alignment are normal. There is no evidence of acute fracture or dislocation. The joint spaces are preserved. There is no elbow joint effusion. Mild spurring of the olecranon process  is noted. There is subcutaneous edema posteromedially without evidence of foreign body or soft tissue emphysema. IMPRESSION: Subcutaneous edema posteromedially without foreign body. No acute osseous findings. Electronically Signed   By: Richardean Sale M.D.   On: 04/13/2019 13:08     Medications:    . amitriptyline  25 mg Oral QHS  . amLODipine  10 mg Oral Daily  . Chlorhexidine Gluconate Cloth  6 each Topical Daily  . epoetin (EPOGEN/PROCRIT) injection  10,000 Units Intravenous Q T,Th,Sa-HD  . feeding supplement (NEPRO CARB STEADY)  237 mL Oral BID BM  . gabapentin  100 mg Oral QHS  . insulin aspart  0-15 Units Subcutaneous TID WC  . insulin aspart  0-5 Units Subcutaneous QHS  . insulin detemir  15 Units Subcutaneous QHS  . irbesartan  300 mg Oral Daily  . labetalol  200 mg Oral BID  . levETIRAcetam  500 mg Oral q1800  . multivitamin  1 tablet Oral QHS  . sodium chloride flush  3 mL Intravenous Q12H   acetaminophen, albuterol, cyclobenzaprine, diphenhydrAMINE, heparin, hydrALAZINE, labetalol, methylPREDNISolone (SOLU-MEDROL) injection, midazolam, ondansetron (ZOFRAN) IV, ondansetron **OR** [DISCONTINUED] ondansetron (ZOFRAN) IV  Assessment/ Plan:    Roy Oconnell is a 43 y.o. black male with diabetes mellitus type 2, hypertension, hyperlipidemia, prior history of kidney donation, chronic kidney disease stage III baseline creatinine 1.5 on August 27, 2018, who was admitted to [facility] on 04/01/2019 for evaluation of seizures and loss of  consciousness.  1.  ESRD.  Chronic kidney disease stage with proteinuria is likely Secondary to solitary kidney, hypertension and diabetic nephropathy. Severe prolonged oliguric AKI may have progressed to ESRD Requiring renal replacement therapy.  CRRT stopped on 6/15. Intermittent hemodialysis on 6/16, 6/18, 6/20. Permcath placed on on 6/18.   -  outpatient d/c planning in progress for [facility].  Awaiting acceptance. Patient will be followed by [facility] nephrology.   2.  Diabetes mellitus type II with chronic kidney disease: noninsulin dependent prior to admission. Hemoglobin A1c of 14.8% - uncontrolled. Patient reports he was not able to afford his medications.  Due to poor diabetic control, suspect renal recovery to be less likely.   3.  Anemia with chronic kidney disease:   - EPO with HD treatment TTS Lab Results  Component Value Date   HGB 9.7 (L) 04/05/2019    4. Seizures: -  levitiracetam.          LOS: [place] 6/25/20209:30 AM

## 2019-04-14 NOTE — Progress Notes (Signed)
Post HD Assessment  1 liter fluid removal, pt stable post tx. CVC dressing changed.    04/14/19 1700  Neurological  Level of Consciousness Alert  Orientation Level Oriented X4  Respiratory  Respiratory Pattern Regular  Chest Assessment Chest expansion symmetrical  Bilateral Breath Sounds Diminished  Cardiac  Pulse Regular  Heart Sounds S1, S2  Jugular Venous Distention (JVD) No  ECG Monitor Yes  Antiarrhythmic device No  Vascular  R Radial Pulse +2  L Radial Pulse +2  Edema Generalized  Generalized Edema +1  RUE Edema Non-pitting  LUE Edema Non-pitting  RLE Edema Non-pitting  LLE Edema Non-Pitting  Integumentary  Integumentary (WDL) WDL  Skin Color Appropriate for ethnicity  Skin Condition Dry  Musculoskeletal  Musculoskeletal (WDL) WDL  Gastrointestinal  Bowel Sounds Assessment Active  GU Assessment  Genitourinary (WDL) X  Genitourinary Symptoms Oliguria (pt voices he makes "very little urine")  Psychosocial  Psychosocial (WDL) WDL  Patient Behaviors Appropriate for age;Appropriate for situation;Cooperative;Calm  Emotional support given Given to patient

## 2019-04-14 NOTE — Progress Notes (Signed)
HD Tx End   1 liter fluid removal during HD tx. CVC dressing changed.    04/14/19 1600  Hand-Off documentation  Report given to (Full Name) Hoyle Sauer RN   Report received from (Full Name) Trellis Paganini RN  Vital Signs  Pulse Rate 88  Pulse Rate Source Monitor  Resp (!) 21  BP (!) 151/77  BP Location Left Arm  BP Method Automatic  Patient Position (if appropriate) Lying  Oxygen Therapy  SpO2 100 %  O2 Device Room Air  Dialysis Weight  Weight 134.2 kg  Type of Weight Post-Dialysis  During Hemodialysis Assessment  Blood Flow Rate (mL/min) 400 mL/min  Arterial Pressure (mmHg) -190 mmHg  Venous Pressure (mmHg) 180 mmHg  Transmembrane Pressure (mmHg) 60 mmHg  Ultrafiltration Rate (mL/min) 470 mL/min  Dialysate Flow Rate (mL/min) 600 ml/min  Conductivity: Machine  14  HD Safety Checks Performed Yes  Intra-Hemodialysis Comments Tx completed  Post-Hemodialysis Assessment  Rinseback Volume (mL) 250 mL  Dialyzer Clearance Lightly streaked  Duration of HD Treatment -hour(s) 3.5 hour(s)  Hemodialysis Intake (mL) 500 mL  UF Total -Machine (mL) 1500 mL  Net UF (mL) 1000 mL  Tolerated HD Treatment Yes  Hemodialysis Catheter Right Subclavian Double lumen Permanent (Tunneled)  Placement Date/Time: 04/07/19 1043   Time Out: Correct patient;Correct site;Correct procedure  Maximum sterile barrier precautions: Hand hygiene;Cap;Mask;Sterile gown;Sterile gloves;Large sterile sheet  Site Prep: Chlorhexidine (preferred)  Local Anes...  Site Condition No complications  Blue Lumen Status Flushed;Capped (Central line);Heparin locked  Red Lumen Status Flushed;Capped (Central line);Heparin locked  Purple Lumen Status N/A  Catheter fill solution Heparin 1000 units/ml  Catheter fill volume (Arterial) 1.7 cc  Catheter fill volume (Venous) 1.7  Dressing Type Biopatch;Occlusive  Dressing Status Clean;Dry;Intact  Interventions Dressing changed;New dressing  Dressing Change Due 04/21/19  Post  treatment catheter status Capped and Clamped

## 2019-04-14 NOTE — Progress Notes (Signed)
Patient ID: Roy Oconnell, male   DOB: [birth year], 43 y.o.   MRN: [id number]  [employer] PROGRESS NOTE  Roy Oconnell NOI:[id number] DOB: December 26, 1975 DOA: 04/01/2019 PCP: Gennaro Africa, MD  HPI/Subjective: Patient feels okay and offers no complaints.  Still waiting on outpatient dialysis center to get final approval prior to discharge.  Objective: Vitals:   04/14/19 1545 04/14/19 1600  BP: (!) 170/79 (!) 151/77  Pulse: 88 88  Resp: 20 (!) 21  Temp:    SpO2: 100% 100%    Intake/Output Summary (Last 24 hours) at 04/14/2019 1608 Last data filed at 04/14/2019 0458 Gross per 24 hour  Intake 120 ml  Output -  Net 120 ml   Filed Weights   04/09/19 1509 04/11/19 0505 04/12/19 0500  Weight: (!) 138.3 kg (!) 138.8 kg 135.2 kg    ROS: Review of Systems  Constitutional: Negative for chills and fever.  Eyes: Negative for blurred vision.  Respiratory: Negative for cough and shortness of breath.   Cardiovascular: Negative for chest pain.  Gastrointestinal: Negative for abdominal pain, constipation, diarrhea, nausea and vomiting.  Genitourinary: Negative for dysuria.  Musculoskeletal: Negative for joint pain.  Neurological: Negative for dizziness and headaches.   Exam: Physical Exam  HENT:  Nose: No mucosal edema.  Mouth/Throat: No oropharyngeal exudate or posterior oropharyngeal edema.  Eyes: Pupils are equal, round, and reactive to light. Conjunctivae, EOM and lids are normal.  Neck: No JVD present. Carotid bruit is not present. No edema present. No thyroid mass and no thyromegaly present.  Cardiovascular: S1 normal and S2 normal. Exam reveals no gallop.  No murmur heard. Pulses:      Dorsalis pedis pulses are 2+ on the right side and 2+ on the left side.  Respiratory: No respiratory distress. He has no wheezes. He has no rhonchi. He has no rales.  GI: Soft. Bowel sounds are normal. There is no abdominal tenderness.  Musculoskeletal:     Right ankle: He  exhibits no swelling.     Left ankle: He exhibits no swelling.  Lymphadenopathy:    He has no cervical adenopathy.  Neurological: He is alert. No cranial nerve deficit.  Skin: Skin is warm. No rash noted. Nails show no clubbing.  Psychiatric: He has a normal mood and affect.      Data Reviewed: Basic Metabolic Panel: Recent Labs  Lab 04/08/19 1147 04/09/19 0946 04/10/19 0437 04/11/19 0641 04/12/19 0430 04/13/19 0446  NA 135 135 134* 138 138 136  K 3.3* 3.7 3.7 3.9 4.6 3.5  CL 95* 97* 94* 97* 94* 94*  CO2 21* 20* 23 21* 21* 25  GLUCOSE 228* 193* 130* 84 89 74  BUN 69* 82* 58* 75* 85* 50*  CREATININE 10.35* 13.36* 10.41* 13.97* 16.04* 11.29*  CALCIUM 8.2* 8.1* 8.1* 8.4* 8.2* 7.7*  PHOS 7.2*  --   --   --   --   --    Liver Function Tests: Recent Labs  Lab 04/08/19 1147  ALBUMIN 2.9*    CBG: Recent Labs  Lab 04/13/19 1132 04/13/19 1645 04/13/19 2130 04/14/19 0751 04/14/19 1143  GLUCAP 104* 77 94 78 116*    Recent Results (from the past 240 hour(s))  C difficile quick scan w PCR reflex     Status: None   Collection Time: 04/04/19  9:07 PM   Specimen: STOOL  Result Value Ref Range Status   C Diff antigen NEGATIVE NEGATIVE Final   C Diff toxin NEGATIVE NEGATIVE Final  C Diff interpretation No C. difficile detected.  Final    Comment: Performed at [facility], [address], [place], [place] [address]     Studies: Dg Elbow 2 Views Right  Result Date: 04/13/2019 CLINICAL DATA:  Inpatient with right elbow swelling. No acute injury. EXAM: RIGHT ELBOW - 2 VIEW COMPARISON:  None. FINDINGS: The mineralization and alignment are normal. There is no evidence of acute fracture or dislocation. The joint spaces are preserved. There is no elbow joint effusion. Mild spurring of the olecranon process is noted. There is subcutaneous edema posteromedially without evidence of foreign body or soft tissue emphysema. IMPRESSION: Subcutaneous edema posteromedially  without foreign body. No acute osseous findings. Electronically Signed   By: Richardean Sale M.D.   On: 04/13/2019 13:08    Scheduled Meds: . amitriptyline  25 mg Oral QHS  . amLODipine  10 mg Oral Daily  . Chlorhexidine Gluconate Cloth  6 each Topical Daily  . diclofenac sodium  2 g Topical QID  . epoetin (EPOGEN/PROCRIT) injection  10,000 Units Intravenous Q T,Th,Sa-HD  . feeding supplement (NEPRO CARB STEADY)  237 mL Oral BID BM  . gabapentin  100 mg Oral QHS  . insulin aspart  0-15 Units Subcutaneous TID WC  . insulin aspart  0-5 Units Subcutaneous QHS  . insulin detemir  15 Units Subcutaneous QHS  . irbesartan  300 mg Oral Daily  . labetalol  200 mg Oral BID  . levETIRAcetam  500 mg Oral q1800  . multivitamin  1 tablet Oral QHS  . sodium chloride flush  3 mL Intravenous Q12H   Continuous Infusions:  Assessment/Plan: 1. End-stage renal disease.  Status post permacath placement.  Still awaiting outpatient hemodialysis approval prior to discharge. 2. Type 2 diabetes mellitus.  Patient initially presented in DKA but this has resolved.  Patient is on Levemir I decreased the dose today down to 15 units and on sliding scale. 3. Seizures on Keppra 4. Acute septic shock secondary to aspiration pneumonia this has resolved and patient finished Zosyn 5. Constipation improved on MiraLAX. 6. Hypertension continue usual medication  Code Status:     Code Status Orders  (From admission, onward)         Start     Ordered   04/01/19 0816  Full code  Continuous     04/01/19 0816        Code Status History    This patient has a current code status but no historical code status.   Advance Care Planning Activity     Disposition Plan: Was hoping patient will be discharged today  Consultants:  Nephrology  Time spent: 31 minutes  [place]

## 2019-04-14 NOTE — Progress Notes (Signed)
Inpatient Diabetes Program Recommendations  AACE/ADA: New Consensus Statement on Inpatient Glycemic Control  Target Ranges:  Prepandial:   less than 140 mg/dL      Peak postprandial:   less than 180 mg/dL (1-2 hours)      Critically ill patients:  140 - 180 mg/dL  Results for Roy Oconnell, Roy Oconnell (MRN [id number]) as of 04/14/2019 09:24  Ref. Range 04/13/2019 07:55 04/13/2019 08:23 04/13/2019 11:32 04/13/2019 16:45 04/13/2019 21:30 04/14/2019 07:51  Glucose-Capillary Latest Ref Range: 70 - 99 mg/dL 63 (L) 83 104 (H) 77 94 78    Review of Glycemic Control  Diabetes history: DM2 Outpatient Diabetes medications: Glipizide 10 mg BID Current orders for Inpatient glycemic control: Levemir 20 units BID, Novolog 0-15 units TID with meals, Novolog 0-5 units QHS   Inpatient Diabetes Program Recommendations:   Insulin - Basal: In reviewing chart, noted patient did NOT receive morning dose of Levemir on 04/13/19 but did receive Levemir 20 units at 21:31 on 6/24. Fasting glucose 78 mg/dl this morning. Please consider decreasing Levemir to 15 units QHS.  Thanks, Barnie Alderman, RN, MSN, CDE Diabetes Coordinator Inpatient Diabetes Program [phone] (Team Pager from 8am to 5pm)

## 2019-04-15 LAB — GLUCOSE, CAPILLARY
Glucose-Capillary: 103 mg/dL — ABNORMAL HIGH (ref 70–99)
Glucose-Capillary: 157 mg/dL — ABNORMAL HIGH (ref 70–99)

## 2019-04-15 NOTE — Progress Notes (Signed)
Patient ID: Roy Oconnell, male   DOB: Feb 06, 1976, 43 y.o.   MRN: [id number]  [facility] PROGRESS NOTE  Roy Oconnell WVP:[id number] DOB: April 06, 1976 DOA: 04/01/2019 PCP: Gennaro Africa, MD  HPI/Subjective: Patient states he feels very weak.  Strength taking a while to come back.  Otherwise feels okay.  Not really urinating.  Objective: Vitals:   04/14/19 2101 04/15/19 0655  BP: 138/75 (!) 146/80  Pulse: 95 99  Resp: 18 18  Temp: 98.9 F (37.2 C) 98.8 F (37.1 C)  SpO2: 96% 97%    Filed Weights   04/11/19 0505 04/12/19 0500 04/14/19 1600  Weight: (!) 138.8 kg 135.2 kg 134.2 kg    ROS: Review of Systems  Constitutional: Positive for malaise/fatigue. Negative for chills and fever.  Eyes: Negative for blurred vision.  Respiratory: Negative for cough and shortness of breath.   Cardiovascular: Negative for chest pain.  Gastrointestinal: Negative for abdominal pain, constipation, diarrhea, nausea and vomiting.  Genitourinary: Negative for dysuria.  Musculoskeletal: Negative for joint pain.  Neurological: Negative for dizziness and headaches.   Exam: Physical Exam  HENT:  Nose: No mucosal edema.  Mouth/Throat: No oropharyngeal exudate or posterior oropharyngeal edema.  Eyes: Pupils are equal, round, and reactive to light. Conjunctivae, EOM and lids are normal.  Neck: No JVD present. Carotid bruit is not present. No edema present. No thyroid mass and no thyromegaly present.  Cardiovascular: S1 normal and S2 normal. Exam reveals no gallop.  No murmur heard. Pulses:      Dorsalis pedis pulses are 2+ on the right side and 2+ on the left side.  Respiratory: No respiratory distress. He has no wheezes. He has no rhonchi. He has no rales.  GI: Soft. Bowel sounds are normal. There is no abdominal tenderness.  Musculoskeletal:     Right ankle: He exhibits swelling.     Left ankle: He exhibits swelling.  Lymphadenopathy:    He has no cervical adenopathy.   Neurological: He is alert. No cranial nerve deficit.  Skin: Skin is warm. No rash noted. Nails show no clubbing.  Psychiatric: He has a normal mood and affect.      Data Reviewed: Basic Metabolic Panel: Recent Labs  Lab 04/09/19 0946 04/10/19 0437 04/11/19 0641 04/12/19 0430 04/13/19 0446  NA 135 134* 138 138 136  K 3.7 3.7 3.9 4.6 3.5  CL 97* 94* 97* 94* 94*  CO2 20* 23 21* 21* 25  GLUCOSE 193* 130* 84 89 74  BUN 82* 58* 75* 85* 50*  CREATININE 13.36* 10.41* 13.97* 16.04* 11.29*  CALCIUM 8.1* 8.1* 8.4* 8.2* 7.7*    CBG: Recent Labs  Lab 04/14/19 0751 04/14/19 1143 04/14/19 1738 04/14/19 2058 04/15/19 0749  GLUCAP 78 116* 78 114* 103*    Scheduled Meds: . amitriptyline  25 mg Oral QHS  . amLODipine  10 mg Oral Daily  . Chlorhexidine Gluconate Cloth  6 each Topical Daily  . diclofenac sodium  2 g Topical QID  . epoetin (EPOGEN/PROCRIT) injection  10,000 Units Intravenous Q T,Th,Sa-HD  . feeding supplement (NEPRO CARB STEADY)  237 mL Oral BID BM  . gabapentin  100 mg Oral QHS  . insulin aspart  0-15 Units Subcutaneous TID WC  . insulin aspart  0-5 Units Subcutaneous QHS  . insulin detemir  15 Units Subcutaneous QHS  . irbesartan  300 mg Oral Daily  . labetalol  200 mg Oral BID  . levETIRAcetam  500 mg Oral q1800  . multivitamin  1 tablet Oral QHS  . sodium chloride flush  3 mL Intravenous Q12H    Assessment/Plan: 1. End-stage renal disease.  Status post permacath placement.  Still awaiting outpatient hemodialysis approval prior to discharge. 2. Type 2 diabetes mellitus.  Patient initially presented in DKA but this has resolved.  Patient is on Levemir I decreased the dose down to 15 units and on sliding scale. 3. Seizures on Keppra 4. Acute septic shock secondary to aspiration pneumonia this has resolved and patient finished Zosyn 5. Constipation improved on MiraLAX. 6. Hypertension continue usual medication  Code Status:     Code Status Orders  (From  admission, onward)         Start     Ordered   04/01/19 0816  Full code  Continuous     04/01/19 0816        Code Status History    This patient has a current code status but no historical code status.   Advance Care Planning Activity     Disposition Plan: Was hoping patient will be discharged today  Consultants:  Nephrology  Time spent: 32 minutes.  Case discussed with nephrology and care manager team.  [facility]

## 2019-04-15 NOTE — Progress Notes (Signed)
[facility]  ROUNDING NOTE   Subjective:   Patient is doing fairly well Denies any acute complaints States he has been ambulatory and walked to the bathroom and in the hallway   Objective:  Vital signs in last 24 hours:  Temp:  [98.8 F (37.1 C)-99.1 F (37.3 C)] 98.8 F (37.1 C) (06/26 0655) Pulse Rate:  [79-100] 99 (06/26 0655) Resp:  [18-23] 18 (06/26 0655) BP: (138-176)/(75-94) 146/80 (06/26 0655) SpO2:  [96 %-100 %] 97 % (06/26 0655) Weight:  [134.2 kg] 134.2 kg (06/25 1600)  Weight change:  Filed Weights   04/11/19 0505 04/12/19 0500 04/14/19 1600  Weight: (!) 138.8 kg 135.2 kg 134.2 kg    Intake/Output: I/O last 3 completed shifts: In: 360 [P.O.:360] Out: 1000 [Other:1000]   Intake/Output this shift:  No intake/output data recorded.  Physical Exam: General: Laying in bed  Head: Moist mucosal membranes  Eyes: Anicteric  Neck:  Supple, no masses  Lungs:  Clear to auscultation,    Heart: regular  Abdomen:  Soft, obese,   Extremities: No edema  Neurologic: Alert and oriented.   Skin: No lesions  Access:  RIJ permcath 6/18.      Basic Metabolic Panel: Recent Labs  Lab 04/09/19 0946 04/10/19 0437 04/11/19 0641 04/12/19 0430 04/13/19 0446  NA 135 134* 138 138 136  K 3.7 3.7 3.9 4.6 3.5  CL 97* 94* 97* 94* 94*  CO2 20* 23 21* 21* 25  GLUCOSE 193* 130* 84 89 74  BUN 82* 58* 75* 85* 50*  CREATININE 13.36* 10.41* 13.97* 16.04* 11.29*  CALCIUM 8.1* 8.1* 8.4* 8.2* 7.7*    Liver Function Tests: No results for input(s): AST, ALT, ALKPHOS, BILITOT, PROT, ALBUMIN in the last 168 hours. No results for input(s): LIPASE, AMYLASE in the last 168 hours. No results for input(s): AMMONIA in the last 168 hours.  CBC: No results for input(s): WBC, NEUTROABS, HGB, HCT, MCV, PLT in the last 168 hours.  Cardiac Enzymes: No results for input(s): CKTOTAL, CKMB, CKMBINDEX, TROPONINI in the last 168 hours.  BNP: Invalid input(s):  POCBNP  CBG: Recent Labs  Lab 04/14/19 1143 04/14/19 1738 04/14/19 2058 04/15/19 0749 04/15/19 1159  GLUCAP 116* 78 114* 103* 157*    Microbiology: Results for orders placed or performed during the hospital encounter of 04/01/19  Culture, blood (routine x 2)     Status: None   Collection Time: 04/01/19  5:30 AM   Specimen: BLOOD  Result Value Ref Range Status   Specimen Description BLOOD BLOOD LEFT HAND  Final   Special Requests   Final    BOTTLES DRAWN AEROBIC AND ANAEROBIC Blood Culture results may not be optimal due to an inadequate volume of blood received in culture bottles   Culture   Final    NO GROWTH 5 DAYS Performed at [facility], [address] [place], [place] [address]    Report Status 04/06/2019 FINAL  Final  Urine culture     Status: None   Collection Time: 04/01/19  5:30 AM   Specimen: Urine, Random  Result Value Ref Range Status   Specimen Description   Final    URINE, RANDOM Performed at [facility], [address] [place], [place] [address]    Special Requests   Final    NONE Performed at [facility], [address] [place], [place] [address]    Culture   Final    NO GROWTH Performed at [facility],  [address], [place], [place] [address]    Report Status 04/02/2019 FINAL  Final  SARS Coronavirus 2 (CEPHEID - Performed in [place] hospital lab), Hosp Order     Status: None   Collection Time: 04/01/19  5:30 AM   Specimen: Nasopharyngeal Swab  Result Value Ref Range Status   SARS Coronavirus 2 NEGATIVE NEGATIVE Final    Comment: (NOTE) If result is NEGATIVE SARS-CoV-2 target nucleic acids are NOT DETECTED. The SARS-CoV-2 RNA is generally detectable in upper and lower  respiratory specimens during the acute phase of infection. The lowest  concentration of SARS-CoV-2 viral copies this assay can detect is 250  copies / mL. A negative result does not preclude SARS-CoV-2 infection  and should not be  used as the sole basis for treatment or other  patient management decisions.  A negative result may occur with  improper specimen collection / handling, submission of specimen other  than nasopharyngeal swab, presence of viral mutation(s) within the  areas targeted by this assay, and inadequate number of viral copies  (<250 copies / mL). A negative result must be combined with clinical  observations, patient history, and epidemiological information. If result is POSITIVE SARS-CoV-2 target nucleic acids are DETECTED. The SARS-CoV-2 RNA is generally detectable in upper and lower  respiratory specimens dur ing the acute phase of infection.  Positive  results are indicative of active infection with SARS-CoV-2.  Clinical  correlation with patient history and other diagnostic information is  necessary to determine patient infection status.  Positive results do  not rule out bacterial infection or co-infection with other viruses. If result is PRESUMPTIVE POSTIVE SARS-CoV-2 nucleic acids MAY BE PRESENT.   A presumptive positive result was obtained on the submitted specimen  and confirmed on repeat testing.  While 2019 novel coronavirus  (SARS-CoV-2) nucleic acids may be present in the submitted sample  additional confirmatory testing may be necessary for epidemiological  and / or clinical management purposes  to differentiate between  SARS-CoV-2 and other Sarbecovirus currently known to infect humans.  If clinically indicated additional testing with an alternate test  methodology [phone]) is advised. The SARS-CoV-2 RNA is generally  detectable in upper and lower respiratory sp ecimens during the acute  phase of infection. The expected result is Negative. Fact Sheet for Patients:  StrictlyIdeas.no Fact Sheet for Healthcare Providers: BankingDealers.co.za This test is not yet approved or cleared by the [place] FDA and has been authorized  for detection and/or diagnosis of SARS-CoV-2 by FDA under an Emergency Use Authorization (EUA).  This EUA will remain in effect (meaning this test can be used) for the duration of the COVID-19 declaration under Section 564(b)(1) of the Act, 21 U.S.C. section 360bbb-3(b)(1), unless the authorization is terminated or revoked sooner. Performed at [facility], [address] [place], [place] [address]   Culture, blood (routine x 2)     Status: None   Collection Time: 04/01/19  6:49 AM   Specimen: BLOOD  Result Value Ref Range Status   Specimen Description BLOOD BLOOD RIGHT HAND  Final   Special Requests   Final    BOTTLES DRAWN AEROBIC AND ANAEROBIC Blood Culture results may not be optimal due to an excessive volume of blood received in culture bottles   Culture   Final    NO GROWTH 5 DAYS Performed at [facility], [address] [place],  [address]    Report Status 04/06/2019 FINAL  Final  MRSA PCR Screening  Status: None   Collection Time: 04/01/19 10:58 AM   Specimen: Nasal Mucosa; Nasopharyngeal  Result Value Ref Range Status   MRSA by PCR NEGATIVE NEGATIVE Final    Comment:        The GeneXpert MRSA Assay (FDA approved for NASAL specimens only), is one component of a comprehensive MRSA colonization surveillance program. It is not intended to diagnose MRSA infection nor to guide or monitor treatment for MRSA infections. Performed at [facility], [address] [place], [place] [address]   Culture, respiratory (non-expectorated)     Status: None   Collection Time: 04/01/19 12:50 PM   Specimen: Tracheal Aspirate; Respiratory  Result Value Ref Range Status   Specimen Description   Final    TRACHEAL ASPIRATE Performed at [facility], [address] [place], [place] [address]    Special Requests   Final    NONE Performed at [facility], [address] [place], [place] [address]    Gram Stain   Final     MODERATE WBC PRESENT, PREDOMINANTLY PMN FEW SQUAMOUS EPITHELIAL CELLS PRESENT ABUNDANT GRAM POSITIVE COCCI RARE GRAM POSITIVE RODS Performed at [facility], [place] [address], [place], [place] [address]    Culture FEW METHICILLIN RESISTANT STAPHYLOCOCCUS AUREUS  Final   Report Status 04/04/2019 FINAL  Final   Organism ID, Bacteria METHICILLIN RESISTANT STAPHYLOCOCCUS AUREUS  Final      Susceptibility   Methicillin resistant staphylococcus aureus - MIC*    CIPROFLOXACIN <=0.5 SENSITIVE Sensitive     ERYTHROMYCIN <=0.25 SENSITIVE Sensitive     GENTAMICIN <=0.5 SENSITIVE Sensitive     OXACILLIN >=4 RESISTANT Resistant     TETRACYCLINE <=1 SENSITIVE Sensitive     VANCOMYCIN <=0.5 SENSITIVE Sensitive     TRIMETH/SULFA <=10 SENSITIVE Sensitive     CLINDAMYCIN <=0.25 SENSITIVE Sensitive     RIFAMPIN <=0.5 SENSITIVE Sensitive     Inducible Clindamycin NEGATIVE Sensitive     * FEW METHICILLIN RESISTANT STAPHYLOCOCCUS AUREUS  C difficile quick scan w PCR reflex     Status: None   Collection Time: 04/04/19  9:07 PM   Specimen: STOOL  Result Value Ref Range Status   C Diff antigen NEGATIVE NEGATIVE Final   C Diff toxin NEGATIVE NEGATIVE Final   C Diff interpretation No C. difficile detected.  Final    Comment: Performed at [facility], [address] [place], [place] [address]    Coagulation Studies: No results for input(s): LABPROT, INR in the last 72 hours.  Urinalysis: No results for input(s): COLORURINE, LABSPEC, PHURINE, GLUCOSEU, HGBUR, BILIRUBINUR, KETONESUR, PROTEINUR, UROBILINOGEN, NITRITE, LEUKOCYTESUR in the last 72 hours.  Invalid input(s): APPERANCEUR    Imaging: No results found.   Medications:    . amitriptyline  25 mg Oral QHS  . amLODipine  10 mg Oral Daily  . Chlorhexidine Gluconate Cloth  6 each Topical Daily  . diclofenac sodium  2 g Topical QID  . epoetin (EPOGEN/PROCRIT) injection  10,000 Units Intravenous Q T,Th,Sa-HD  . feeding  supplement (NEPRO CARB STEADY)  237 mL Oral BID BM  . gabapentin  100 mg Oral QHS  . insulin aspart  0-15 Units Subcutaneous TID WC  . insulin aspart  0-5 Units Subcutaneous QHS  . insulin detemir  15 Units Subcutaneous QHS  . irbesartan  300 mg Oral Daily  . labetalol  200 mg Oral BID  . levETIRAcetam  500 mg Oral q1800  . multivitamin  1 tablet Oral QHS  .  sodium chloride flush  3 mL Intravenous Q12H   acetaminophen, albuterol, cyclobenzaprine, diphenhydrAMINE, heparin, hydrALAZINE, labetalol, methylPREDNISolone (SOLU-MEDROL) injection, midazolam, ondansetron (ZOFRAN) IV, ondansetron **OR** [DISCONTINUED] ondansetron (ZOFRAN) IV  Assessment/ Plan:    Mr. Roy Oconnell is a 43 y.o. black male with diabetes mellitus type 2, hypertension, hyperlipidemia, prior history of kidney donation, chronic kidney disease stage III baseline creatinine 1.5 on August 27, 2018, who was admitted to [facility] on 04/01/2019 for evaluation of seizures and loss of consciousness.  1.  ESRD.  Chronic kidney disease stage with proteinuria is likely Secondary to solitary kidney, hypertension and diabetic nephropathy. Severe prolonged oliguric AKI may have progressed to ESRD Requiring renal replacement therapy.  CRRT stopped on 6/15. Intermittent hemodialysis on 6/16, 6/18, 6/20. Permcath placed on on 6/18.   -  outpatient d/c planning in progress for [facility]. Patient will be followed by [facility] nephrology.   2.  Diabetes mellitus type II with chronic kidney disease: noninsulin dependent prior to admission. Hemoglobin A1c of 14.8% - uncontrolled. Patient reports he was not able to afford his medications.  Due to poor diabetic control, suspect renal recovery to be less likely.   3.  Anemia with chronic kidney disease:   - EPO with HD treatment TTS Lab Results  Component Value Date   HGB 9.7 (L) 04/05/2019    4. Seizures: -  levitiracetam.          LOS: [place] 6/26/20202:45  PM

## 2019-04-15 NOTE — Discharge Summary (Signed)
[facility] NAME: Roy Oconnell    MR#:  [id number]  DATE OF BIRTH:  09-02-1976  DATE OF ADMISSION:  04/01/2019 ADMITTING PHYSICIAN: Hillary Bow, MD  DATE OF DISCHARGE: 04/15/2019  PRIMARY CARE PHYSICIAN: Gennaro Africa, MD   ADMISSION DIAGNOSIS:  Hyperglycemia [R73.9] Unresponsive [R41.89] Acute respiratory failure with hypoxia (HCC) [J96.01] Seizure-like activity (HCC) [R56.9] Diabetic ketoacidosis with coma associated with type 2 diabetes mellitus ([place]) [E11.11] Aspiration pneumonia, unspecified aspiration pneumonia type, unspecified laterality, unspecified part of lung ([place]) [J69.0] Community acquired pneumonia, unspecified laterality [J18.9] Sepsis, due to unspecified organism, unspecified whether acute organ dysfunction present ([place]) [A41.9]  DISCHARGE DIAGNOSIS:  Active Problems:   DKA (diabetic ketoacidoses) ([place])   Acute respiratory failure with hypoxia ([place])   Sepsis ([place])   SECONDARY DIAGNOSIS:   Past Medical History:  Diagnosis Date  . Diabetes mellitus without complication ([place])   . Hyperlipemia   . Hypertension     HOSPITAL COURSE:   1.  Diabetic ketoacidosis on presentation.  Patient was admitted to the ICU and started on insulin drip and now switched over to long-acting insulin 15 units at night and short acting insulin prior to meals.   2.  New onset seizures patient was given Keppra seen by neurology and was discharged on Keppra twice a day.   3.  Septic shock with aspiration pneumonia.  Patient finished course of IV Zosyn.  Completed treatment of antibiotics.  Patient had leukocytosis and lactic acidosis. 3.  Acute kidney injury that never improved and now is end-stage renal disease.  We finally got approval that he can start dialysis on Tuesday as outpatient. 4.  Acute metabolic encephalopathy.  This has improved. 5.  Constipation on MiraLAX 6.  Hypertension.  Patient prescribed  medications.  Medications were provided by medication management.   DISCHARGE CONDITIONS:  Satisfactory  CONSULTS OBTAINED:  Treatment Team:  Leotis Pain, MD  DRUG ALLERGIES:  No Known Allergies  DISCHARGE MEDICATIONS:   Allergies as of 04/15/2019   No Known Allergies     Medication List    STOP taking these medications   chlorthalidone 25 MG tablet Commonly known as: HYGROTON   glipiZIDE 10 MG tablet Commonly known as: GLUCOTROL   lisinopril 40 MG tablet Commonly known as: ZESTRIL   metoprolol succinate 50 MG 24 hr tablet Commonly known as: TOPROL-XL   spironolactone 25 MG tablet Commonly known as: ALDACTONE     TAKE these medications   amLODipine 10 MG tablet Commonly known as: NORVASC Take 1 tablet (10 mg total) by mouth daily.   aspirin EC 81 MG tablet Take 1 tablet (81 mg total) by mouth daily.   atorvastatin 20 MG tablet Commonly known as: LIPITOR Take 1 tablet (20 mg total) by mouth daily.   Basaglar KwikPen 100 UNIT/ML Sopn Inject 0.15 mLs (15 Units total) into the skin at bedtime.   gabapentin 100 MG capsule Commonly known as: NEURONTIN Take 1 capsule (100 mg total) by mouth at bedtime.   insulin lispro 100 UNIT/ML injection Commonly known as: HumaLOG Inject 0-0.2 mLs (0-20 Units total) into the skin 4 (four) times daily - after meals and at bedtime. Glucose 150-200-give 3 units subcu Glucose 201-250-give 6units subcu Glucose 251-300-give 9 units subcu Glucose 301-350-give 12 units subcu Glucose 351-400-give 15 units and call primary care provider   labetalol 200 MG tablet Commonly known as: NORMODYNE Take 1 tablet (200 mg total) by mouth 2 (two) times daily.  levETIRAcetam 250 MG tablet Commonly known as: Keppra Take 1 tablet (250 mg total) by mouth 2 (two) times daily.   losartan 100 MG tablet Commonly known as: Cozaar Take 1 tablet (100 mg total) by mouth daily.   multivitamin Tabs tablet Take 1 tablet by mouth at  bedtime.            Durable Medical Equipment  (From admission, onward)         Start     Ordered   04/13/19 1507  For home use only DME Walker rolling  Once    Question:  Patient needs a walker to treat with the following condition  Answer:  Generalized weakness   04/13/19 1507           DISCHARGE INSTRUCTIONS:   Follow-up with new PCP on 04/18/2019 Follow-up at dialysis.   Follow-up with Dr. Rolly Salter.  Nephrology  If you experience worsening of your admission symptoms, develop shortness of breath, life threatening emergency, suicidal or homicidal thoughts you must seek medical attention immediately by calling 911 or calling your MD immediately  if symptoms less severe.  You Must read complete instructions/literature along with all the possible adverse reactions/side effects for all the Medicines you take and that have been prescribed to you. Take any new Medicines after you have completely understood and accept all the possible adverse reactions/side effects.   Please note  You were cared for by a hospitalist during your hospital stay. If you have any questions about your discharge medications or the care you received while you were in the hospital after you are discharged, you can call the unit and asked to speak with the hospitalist on call if the hospitalist that took care of you is not available. Once you are discharged, your primary care physician will handle any further medical issues. Please note that NO REFILLS for any discharge medications will be authorized once you are discharged, as it is imperative that you return to your primary care physician (or establish a relationship with a primary care physician if you do not have one) for your aftercare needs so that they can reassess your need for medications and monitor your lab values.    Today   CHIEF COMPLAINT:   Chief Complaint  Patient presents with  . Seizures  . Loss of Consciousness    HISTORY OF PRESENT  ILLNESS:  Roy Oconnell  is a 43 y.o. male came in with altered mental status and seizure   VITAL SIGNS:  Blood pressure (!) 146/80, pulse 99, temperature 98.8 F (37.1 C), temperature source Oral, resp. rate 18, height 5\' 10"  (1.778 m), weight 134.2 kg, SpO2 97 %.    PHYSICAL EXAMINATION:  GENERAL:  43 y.o.-year-old patient lying in the bed with no acute distress.  EYES: Pupils equal, round, reactive to light and accommodation. No scleral icterus. Extraocular muscles intact.  HEENT: Head atraumatic, normocephalic. Oropharynx and nasopharynx clear.  NECK:  Supple, no jugular venous distention. No thyroid enlargement, no tenderness.  LUNGS: Normal breath sounds bilaterally, no wheezing, rales,rhonchi or crepitation. No use of accessory muscles of respiration.  CARDIOVASCULAR: S1, S2 normal. No murmurs, rubs, or gallops.  ABDOMEN: Soft, non-tender, non-distended. Bowel sounds present. No organomegaly or mass.  EXTREMITIES: No pedal edema, cyanosis, or clubbing.  NEUROLOGIC: Cranial nerves II through XII are intact. Muscle strength 5/5 in all extremities. Sensation intact. Gait not checked.  PSYCHIATRIC: The patient is alert and oriented x 3.  SKIN: No obvious rash, lesion, or  ulcer.   DATA REVIEW:    Chemistries  Recent Labs  Lab 04/13/19 0446  NA 136  K 3.5  CL 94*  CO2 25  GLUCOSE 74  BUN 50*  CREATININE 11.29*  CALCIUM 7.7*     Microbiology Results  Results for orders placed or performed during the hospital encounter of 04/01/19  Culture, blood (routine x 2)     Status: None   Collection Time: 04/01/19  5:30 AM   Specimen: BLOOD  Result Value Ref Range Status   Specimen Description BLOOD BLOOD LEFT HAND  Final   Special Requests   Final    BOTTLES DRAWN AEROBIC AND ANAEROBIC Blood Culture results may not be optimal due to an inadequate volume of blood received in culture bottles   Culture   Final    NO GROWTH 5 DAYS Performed at [facility], [address], [place], [place] [address]    Report Status 04/06/2019 FINAL  Final  Urine culture     Status: None   Collection Time: 04/01/19  5:30 AM   Specimen: Urine, Random  Result Value Ref Range Status   Specimen Description   Final    URINE, RANDOM Performed at [facility], [address] [place], [place] [address]    Special Requests   Final    NONE Performed at [facility], [address], [place], [place] [address]    Culture   Final    NO GROWTH Performed at [facility], [place] [address] [place], [place] [address]    Report Status 04/02/2019 FINAL  Final  SARS Coronavirus 2 (CEPHEID - Performed in [place] hospital lab), Hosp Order     Status: None   Collection Time: 04/01/19  5:30 AM   Specimen: Nasopharyngeal Swab  Result Value Ref Range Status   SARS Coronavirus 2 NEGATIVE NEGATIVE Final    Comment: (NOTE) If result is NEGATIVE SARS-CoV-2 target nucleic acids are NOT DETECTED. The SARS-CoV-2 RNA is generally detectable in upper and lower  respiratory specimens during the acute phase of infection. The lowest  concentration of SARS-CoV-2 viral copies this assay can detect is 250  copies / mL. A negative result does not preclude SARS-CoV-2 infection  and should not be used as the sole basis for treatment or other  patient management decisions.  A negative result may occur with  improper specimen collection / handling, submission of specimen other  than nasopharyngeal swab, presence of viral mutation(s) within the  areas targeted by this assay, and inadequate number of viral copies  (<250 copies / mL). A negative result must be combined with clinical  observations, patient history, and epidemiological information. If result is POSITIVE SARS-CoV-2 target nucleic acids are DETECTED. The SARS-CoV-2 RNA is generally detectable in upper and lower  respiratory specimens dur ing the acute phase of infection.  Positive  results are indicative of  active infection with SARS-CoV-2.  Clinical  correlation with patient history and other diagnostic information is  necessary to determine patient infection status.  Positive results do  not rule out bacterial infection or co-infection with other viruses. If result is PRESUMPTIVE POSTIVE SARS-CoV-2 nucleic acids MAY BE PRESENT.   A presumptive positive result was obtained on the submitted specimen  and confirmed on repeat testing.  While 2019 novel coronavirus  (SARS-CoV-2) nucleic acids may be present in the submitted sample  additional confirmatory testing may be necessary for epidemiological  and / or clinical management purposes  to differentiate between  SARS-CoV-2 and other Sarbecovirus currently known to infect humans.  If clinically indicated additional testing with an alternate test  methodology [phone]) is advised. The SARS-CoV-2 RNA is generally  detectable in upper and lower respiratory sp ecimens during the acute  phase of infection. The expected result is Negative. Fact Sheet for Patients:  StrictlyIdeas.no Fact Sheet for Healthcare Providers: BankingDealers.co.za This test is not yet approved or cleared by the [place] FDA and has been authorized for detection and/or diagnosis of SARS-CoV-2 by FDA under an Emergency Use Authorization (EUA).  This EUA will remain in effect (meaning this test can be used) for the duration of the COVID-19 declaration under Section 564(b)(1) of the Act, 21 U.S.C. section 360bbb-3(b)(1), unless the authorization is terminated or revoked sooner. Performed at [facility], [address] [place], [place] [address]   Culture, blood (routine x 2)     Status: None   Collection Time: 04/01/19  6:49 AM   Specimen: BLOOD  Result Value Ref Range Status   Specimen Description BLOOD BLOOD RIGHT HAND  Final   Special Requests   Final    BOTTLES DRAWN AEROBIC AND ANAEROBIC Blood Culture  results may not be optimal due to an excessive volume of blood received in culture bottles   Culture   Final    NO GROWTH 5 DAYS Performed at [facility], [address] [place], [place] [address]    Report Status 04/06/2019 FINAL  Final  MRSA PCR Screening     Status: None   Collection Time: 04/01/19 10:58 AM   Specimen: Nasal Mucosa; Nasopharyngeal  Result Value Ref Range Status   MRSA by PCR NEGATIVE NEGATIVE Final    Comment:        The GeneXpert MRSA Assay (FDA approved for NASAL specimens only), is one component of a comprehensive MRSA colonization surveillance program. It is not intended to diagnose MRSA infection nor to guide or monitor treatment for MRSA infections. Performed at [facility], [address] [place], [place] [address]   Culture, respiratory (non-expectorated)     Status: None   Collection Time: 04/01/19 12:50 PM   Specimen: Tracheal Aspirate; Respiratory  Result Value Ref Range Status   Specimen Description   Final    TRACHEAL ASPIRATE Performed at [facility], [address] [place], [place] [address]    Special Requests   Final    NONE Performed at [facility], [address] [place], [place] [address]    Gram Stain   Final    MODERATE WBC PRESENT, PREDOMINANTLY PMN FEW SQUAMOUS EPITHELIAL CELLS PRESENT ABUNDANT GRAM POSITIVE COCCI RARE GRAM POSITIVE RODS Performed at [facility], [place] [address] [place], [place] [address]    Culture FEW METHICILLIN RESISTANT STAPHYLOCOCCUS AUREUS  Final   Report Status 04/04/2019 FINAL  Final   Organism ID, Bacteria METHICILLIN RESISTANT STAPHYLOCOCCUS AUREUS  Final      Susceptibility   Methicillin resistant staphylococcus aureus - MIC*    CIPROFLOXACIN <=0.5 SENSITIVE Sensitive     ERYTHROMYCIN <=0.25 SENSITIVE Sensitive     GENTAMICIN <=0.5 SENSITIVE Sensitive     OXACILLIN >=4 RESISTANT Resistant     TETRACYCLINE <=1 SENSITIVE Sensitive      VANCOMYCIN <=0.5 SENSITIVE Sensitive     TRIMETH/SULFA <=10 SENSITIVE Sensitive     CLINDAMYCIN <=0.25 SENSITIVE Sensitive     RIFAMPIN <=0.5 SENSITIVE Sensitive     Inducible Clindamycin NEGATIVE Sensitive     *  FEW METHICILLIN RESISTANT STAPHYLOCOCCUS AUREUS  C difficile quick scan w PCR reflex     Status: None   Collection Time: 04/04/19  9:07 PM   Specimen: STOOL  Result Value Ref Range Status   C Diff antigen NEGATIVE NEGATIVE Final   C Diff toxin NEGATIVE NEGATIVE Final   C Diff interpretation No C. difficile detected.  Final    Comment: Performed at [facility], [address] [place], [place] [address]     Management plans discussed with the patient, and he is in agreement.  Also spoke with the patient's mother.  CODE STATUS:     Code Status Orders  (From admission, onward)         Start     Ordered   04/01/19 0816  Full code  Continuous     04/01/19 0816        Code Status History    This patient has a current code status but no historical code status.   Advance Care Planning Activity      TOTAL TIME TAKING CARE OF THIS PATIENT: 35 minutes.    Loletha Grayer M.D on 04/15/2019 at 4:25 PM  Between 7am to 6pm - Pager - [phone]  After 6pm go to [email or web address] - password [email or web address]  [employer] Office  [phone]  CC: Primary care physician; Gennaro Africa, MD

## 2019-04-15 NOTE — TOC Transition Note (Signed)
Transition of Care (TOC) - CM/SW Discharge Note   Patient Details  Name: Roy Oconnell MRN: [id number] Date of Birth: 07/06/1976  Transition of Care (TOC) CM/SW Contact:  BOWEN, STEPHANIE T, RN Phone Number: 04/15/2019, 2:48 PM   Clinical Narrative:    Received verification for patient to start outpatient HD on Tuesday.  Amanda Morris HD liaison met with patient at bedside.  RW was delivered to room yesterday  Cory with [facility] notified of discharge   All medications for discharge delivered to patient at bedside by bedside RN   Final next level of care: Home w Home Health Services Barriers to Discharge: Barriers Resolved   Patient Goals and CMS Choice        Discharge Placement                       Discharge Plan and Services   Discharge Planning Services: CM Consult, Medication Assistance, Indigent Health Clinic Post Acute Care Choice: Dialysis          DME Arranged: Walker rolling DME Agency: [employer] Date DME Agency Contacted: 04/14/19 Time DME Agency Contacted: 1300 Representative spoke with at DME Agency: Brad HH Arranged: RN, PT, Social Work HH Agency: [facility] Date HH Agency Contacted: 04/15/19 Time HH Agency Contacted: 1447 Representative spoke with at HH Agency: Cory  Social Determinants of Health (SDOH) Interventions     Readmission Risk Interventions No flowsheet data found.     

## 2019-04-15 NOTE — Progress Notes (Signed)
PT Cancellation Note  Patient Details Name: Yoskar Murrillo MRN: [id number] DOB: 1976-09-11   Cancelled Treatment:    Reason Eval/Treat Not Completed: Other (comment)   Pt in bed, stated he is moving around better using walker.  Stated he anticipated discharge today and does not feel like he needs further therapy interventions.    Chesley Noon 04/15/2019, 10:13 AM

## 2019-04-15 NOTE — Progress Notes (Signed)
Pt discharged per MD order. IV removed. Discharge instructions reviewed with pt. Pt verbalized understanding. All questions answered to pt satisfaction. Pt taken downstairs in wheelchair by staff.  

## 2019-04-15 NOTE — Progress Notes (Signed)
Clinic accepted and financially cleared to start Tuesday 6/30 11:00 at [facility] .  Elvera Bicker Dialysis Coordinator  [phone]

## 2019-04-30 ENCOUNTER — Emergency Department
Admission: EM | Admit: 2019-04-30 | Discharge: 2019-04-30 | Disposition: A | Discharge status: Home / Self Care | Payer: Medicaid Other | Attending: Emergency Medicine | Admitting: Emergency Medicine

## 2019-04-30 ENCOUNTER — Encounter: Payer: Self-pay | Admitting: Emergency Medicine

## 2019-04-30 ENCOUNTER — Other Ambulatory Visit: Payer: Self-pay

## 2019-04-30 DIAGNOSIS — Z0000 Encounter for general adult medical examination without abnormal findings: Secondary | ICD-10-CM

## 2019-04-30 DIAGNOSIS — I120 Hypertensive chronic kidney disease with stage 5 chronic kidney disease or end stage renal disease: Secondary | ICD-10-CM | POA: Insufficient documentation

## 2019-04-30 DIAGNOSIS — Z79899 Other long term (current) drug therapy: Secondary | ICD-10-CM | POA: Insufficient documentation

## 2019-04-30 DIAGNOSIS — N186 End stage renal disease: Secondary | ICD-10-CM | POA: Insufficient documentation

## 2019-04-30 DIAGNOSIS — Z7982 Long term (current) use of aspirin: Secondary | ICD-10-CM | POA: Insufficient documentation

## 2019-04-30 DIAGNOSIS — E1122 Type 2 diabetes mellitus with diabetic chronic kidney disease: Secondary | ICD-10-CM | POA: Insufficient documentation

## 2019-04-30 DIAGNOSIS — Z992 Dependence on renal dialysis: Secondary | ICD-10-CM | POA: Insufficient documentation

## 2019-04-30 DIAGNOSIS — Z794 Long term (current) use of insulin: Secondary | ICD-10-CM | POA: Insufficient documentation

## 2019-04-30 DIAGNOSIS — Z0489 Encounter for examination and observation for other specified reasons: Secondary | ICD-10-CM | POA: Insufficient documentation

## 2019-04-30 HISTORY — DX: Disorder of kidney and ureter, unspecified: N28.9

## 2019-04-30 LAB — CBC WITH DIFFERENTIAL/PLATELET
Abs Immature Granulocytes: 0.03 10*3/uL (ref 0.00–0.07)
Basophils Absolute: 0.1 10*3/uL (ref 0.0–0.1)
Basophils Relative: 2 %
Eosinophils Absolute: 0.7 10*3/uL — ABNORMAL HIGH (ref 0.0–0.5)
Eosinophils Relative: 9 %
HCT: 31.2 % — ABNORMAL LOW (ref 39.0–52.0)
Hemoglobin: 9.3 g/dL — ABNORMAL LOW (ref 13.0–17.0)
Immature Granulocytes: 0 %
Lymphocytes Relative: 32 %
Lymphs Abs: 2.4 10*3/uL (ref 0.7–4.0)
MCH: 28.9 pg (ref 26.0–34.0)
MCHC: 29.8 g/dL — ABNORMAL LOW (ref 30.0–36.0)
MCV: 96.9 fL (ref 80.0–100.0)
Monocytes Absolute: 0.9 10*3/uL (ref 0.1–1.0)
Monocytes Relative: 12 %
Neutro Abs: 3.3 10*3/uL (ref 1.7–7.7)
Neutrophils Relative %: 45 %
Platelets: 349 10*3/uL (ref 150–400)
RBC: 3.22 MIL/uL — ABNORMAL LOW (ref 4.22–5.81)
RDW: 14.1 % (ref 11.5–15.5)
WBC: 7.3 10*3/uL (ref 4.0–10.5)
nRBC: 0 % (ref 0.0–0.2)

## 2019-04-30 LAB — BASIC METABOLIC PANEL
Anion gap: 12 (ref 5–15)
BUN: 6 mg/dL (ref 6–20)
CO2: 30 mmol/L (ref 22–32)
Calcium: 9.2 mg/dL (ref 8.9–10.3)
Chloride: 96 mmol/L — ABNORMAL LOW (ref 98–111)
Creatinine, Ser: 2.31 mg/dL — ABNORMAL HIGH (ref 0.61–1.24)
GFR calc Af Amer: 39 mL/min — ABNORMAL LOW (ref >60)
GFR calc non Af Amer: 34 mL/min — ABNORMAL LOW (ref >60)
Glucose, Bld: 121 mg/dL — ABNORMAL HIGH (ref 70–99)
Potassium: 3.3 mmol/L — ABNORMAL LOW (ref 3.5–5.1)
Sodium: 138 mmol/L (ref 135–145)

## 2019-04-30 LAB — TYPE AND SCREEN
ABO/RH(D): O NEG
Antibody Screen: NEGATIVE

## 2019-04-30 LAB — HEMOGLOBIN AND HEMATOCRIT, BLOOD
HCT: 30 % — ABNORMAL LOW (ref 39.0–52.0)
Hemoglobin: 9 g/dL — ABNORMAL LOW (ref 13.0–17.0)

## 2019-04-30 LAB — GLUCOSE, CAPILLARY: Glucose-Capillary: 114 mg/dL — ABNORMAL HIGH (ref 70–99)

## 2019-04-30 NOTE — ED Triage Notes (Signed)
Pt to ED via POV from Dialysis center. Pt states that he was sent from dialysis because he has had a 2 point drop in his Hgb in the last 2 weeks. Pt states that finished his dialysis treatment today. Pt dialysis access is in his right upper chest. Pt denies any dizziness or lightheadedness. Pt denies blood in his stool. Pt is in NAD.

## 2019-04-30 NOTE — ED Provider Notes (Signed)
[facility] Emergency Department Provider Note  Time seen: 4:18 PM  I have reviewed the triage vital signs and the nursing notes.   HISTORY  Chief Complaint Abnormal Lab   HPI Roy Oconnell is a 43 y.o. male with a past medical history of hypertension, hyperlipidemia, diabetes, recently started dialysis 2 weeks ago presents to the emergency department for anemia.  According to the patient he was getting dialysis today and they checked his labs and it appeared that his hemoglobin had dropped approximately 2-3 points.  Patient was referred to the emergency department for further evaluation.  Patient denies any black or bloody stool denies any weakness chest pain or shortness of breath.  No fever cough or congestion.   Past Medical History:  Diagnosis Date  . Diabetes mellitus without complication ([place])   . Hyperlipemia   . Hypertension   . Renal disorder     Patient Active Problem List   Diagnosis Date Noted  . DKA (diabetic ketoacidoses) ([place]) 04/01/2019  . Acute respiratory failure with hypoxia ([place])   . Sepsis [facility]     Past Surgical History:  Procedure Laterality Date  . DIALYSIS/PERMA CATHETER INSERTION N/A 04/07/2019   Procedure: DIALYSIS/PERMA CATHETER INSERTION;  Surgeon: Algernon Huxley, MD;  Location: [facility];  Service: Cardiovascular;  Laterality: N/A;    Prior to Admission medications   Medication Sig Start Date End Date Taking? Authorizing Provider  amLODipine (NORVASC) 10 MG tablet Take 1 tablet (10 mg total) by mouth daily. 04/13/19   Mayo, Pete Pelt, MD  aspirin EC 81 MG tablet Take 1 tablet (81 mg total) by mouth daily. 04/13/19 12/20/32  Mayo, Pete Pelt, MD  atorvastatin (LIPITOR) 20 MG tablet Take 1 tablet (20 mg total) by mouth daily. 04/13/19   Mayo, Pete Pelt, MD  gabapentin (NEURONTIN) 100 MG capsule Take 1 capsule (100 mg total) by mouth at bedtime. 04/13/19   Mayo, Pete Pelt, MD  Insulin Glargine (BASAGLAR KWIKPEN) 100  UNIT/ML SOPN Inject 0.15 mLs (15 Units total) into the skin at bedtime. 04/14/19   Loletha Grayer, MD  insulin lispro (HUMALOG) 100 UNIT/ML injection Inject 0-0.2 mLs (0-20 Units total) into the skin 4 (four) times daily - after meals and at bedtime. Glucose 150-200-give 3 units subcu Glucose 201-250-give 6units subcu Glucose 251-300-give 9 units subcu Glucose 301-350-give 12 units subcu Glucose 351-400-give 15 units and call primary care provider 04/11/19   Salary, Avel Peace, MD  labetalol (NORMODYNE) 200 MG tablet Take 1 tablet (200 mg total) by mouth 2 (two) times daily. 04/13/19   Mayo, Pete Pelt, MD  levETIRAcetam (KEPPRA) 250 MG tablet Take 1 tablet (250 mg total) by mouth 2 (two) times daily. 04/13/19   Mayo, Pete Pelt, MD  losartan (COZAAR) 100 MG tablet Take 1 tablet (100 mg total) by mouth daily. 04/13/19 04/12/20  Mayo, Pete Pelt, MD  multivitamin (RENA-VIT) TABS tablet Take 1 tablet by mouth at bedtime. 04/13/19   Mayo, Pete Pelt, MD    No Known Allergies  No family history on file.  Social History Social History   Tobacco Use  . Smoking status: Never Smoker  . Smokeless tobacco: Never Used  Substance Use Topics  . Alcohol use: Not Currently    Comment: unable to review  . Drug use: Not on file    Comment: unable to review    Review of Systems Constitutional: Negative for fever. ENT: Negative for recent illness/congestion Cardiovascular: Negative for chest pain. Respiratory: Negative  for shortness of breath. Gastrointestinal: Negative for abdominal pain, vomiting  Musculoskeletal: Negative for musculoskeletal complaints Skin: Negative for skin complaints  Neurological: Negative for headache All other ROS negative  ____________________________________________   PHYSICAL EXAM:  VITAL SIGNS: ED Triage Vitals  Enc Vitals Group     BP 04/30/19 1443 (!) 179/82     Pulse Rate 04/30/19 1443 73     Resp 04/30/19 1443 16     Temp 04/30/19 1443 98.9 F (37.2 C)      Temp Source 04/30/19 1443 Oral     SpO2 04/30/19 1443 99 %     Weight 04/30/19 1438 279 lb 15.8 oz (127 kg)     Height 04/30/19 1438 5\' 10"  (1.778 m)     Head Circumference --      Peak Flow --      Pain Score 04/30/19 1438 0     Pain Loc --      Pain Edu? --      Excl. in [place]? --    Constitutional: Alert and oriented. Well appearing and in no distress. Eyes: Normal exam ENT      Head: Normocephalic and atraumatic.      Mouth/Throat: Mucous membranes are moist. Cardiovascular: Normal rate, regular rhythm. No murmur Respiratory: Normal respiratory effort without tachypnea nor retractions. Breath sounds are clear Gastrointestinal: Soft and nontender. No distention.  Musculoskeletal: Nontender with normal range of motion in all extremities.  Neurologic:  Normal speech and language. No gross focal neurologic deficits  Skin:  Skin is warm, dry and intact.  Psychiatric: Mood and affect are normal.  ____________________________________________  INITIAL IMPRESSION / ASSESSMENT AND PLAN / ED COURSE  Pertinent labs & imaging results that were available during my care of the patient were reviewed by me and considered in my medical decision making (see chart for details).   Patient presents emergency department for a low hemoglobin checked on routine lab work at dialysis today.  Overall the patient appears well denies any black or bloody stools shortness of breath weakness or chest pain.  Patient's repeat labs today show an unchanged hemoglobin currently 9.3.  We will repeat his hemoglobin one more time to ensure that it is accurate.  As long the patient's repeat hemoglobin remains normal we will discharge home.  Suspect likely a lab abnormality or accidental dilution at dialysis.  Repeat lab work remains close to baseline.  Patient will be discharged home with routine follow-up.  Patient agreeable.  Roy Oconnell was evaluated in Emergency Department on 04/30/2019 for the symptoms described  in the history of present illness. He was evaluated in the context of the global COVID-19 pandemic, which necessitated consideration that the patient might be at risk for infection with the SARS-CoV-2 virus that causes COVID-19. Institutional protocols and algorithms that pertain to the evaluation of patients at risk for COVID-19 are in a state of rapid change based on information released by regulatory bodies including the CDC and federal and state organizations. These policies and algorithms were followed during the patient's care in the ED.  ____________________________________________   FINAL CLINICAL IMPRESSION(S) / ED DIAGNOSES  Normal physical exam Lab error   Harvest Dark, MD 04/30/19 1659

## 2019-05-12 ENCOUNTER — Other Ambulatory Visit (INDEPENDENT_AMBULATORY_CARE_PROVIDER_SITE_OTHER): Payer: Self-pay | Admitting: Vascular Surgery

## 2019-05-12 DIAGNOSIS — N186 End stage renal disease: Secondary | ICD-10-CM

## 2019-05-16 ENCOUNTER — Other Ambulatory Visit: Payer: Self-pay

## 2019-05-16 ENCOUNTER — Ambulatory Visit (INDEPENDENT_AMBULATORY_CARE_PROVIDER_SITE_OTHER): Payer: Self-pay

## 2019-05-16 ENCOUNTER — Ambulatory Visit (INDEPENDENT_AMBULATORY_CARE_PROVIDER_SITE_OTHER): Payer: Self-pay | Admitting: Vascular Surgery

## 2019-05-16 ENCOUNTER — Encounter (INDEPENDENT_AMBULATORY_CARE_PROVIDER_SITE_OTHER): Payer: Self-pay

## 2019-05-16 ENCOUNTER — Encounter (INDEPENDENT_AMBULATORY_CARE_PROVIDER_SITE_OTHER): Payer: Self-pay | Admitting: Vascular Surgery

## 2019-05-16 DIAGNOSIS — N186 End stage renal disease: Secondary | ICD-10-CM

## 2019-05-16 DIAGNOSIS — Z992 Dependence on renal dialysis: Secondary | ICD-10-CM

## 2019-05-16 DIAGNOSIS — E785 Hyperlipidemia, unspecified: Secondary | ICD-10-CM | POA: Insufficient documentation

## 2019-05-16 DIAGNOSIS — E782 Mixed hyperlipidemia: Secondary | ICD-10-CM

## 2019-05-16 DIAGNOSIS — E1122 Type 2 diabetes mellitus with diabetic chronic kidney disease: Secondary | ICD-10-CM

## 2019-05-16 DIAGNOSIS — I120 Hypertensive chronic kidney disease with stage 5 chronic kidney disease or end stage renal disease: Secondary | ICD-10-CM

## 2019-05-16 DIAGNOSIS — Z794 Long term (current) use of insulin: Secondary | ICD-10-CM

## 2019-05-16 DIAGNOSIS — E119 Type 2 diabetes mellitus without complications: Secondary | ICD-10-CM | POA: Insufficient documentation

## 2019-05-16 DIAGNOSIS — I10 Essential (primary) hypertension: Secondary | ICD-10-CM | POA: Insufficient documentation

## 2019-05-16 DIAGNOSIS — Z79899 Other long term (current) drug therapy: Secondary | ICD-10-CM

## 2019-05-16 NOTE — Progress Notes (Signed)
MRN : [id number]  Roy Oconnell is a 43 y.o. (Oct 30, 1975) male who presents with chief complaint of No chief complaint on file. Marland Kitchen  History of Present Illness:    The patient is seen for evaluation of dialysis access.  His right IJ catheter is his first access.    Current access is via a catheter which is functioning well.  There have not been any episodes of catheter infection.  The patient denies amaurosis fugax or recent TIA symptoms. There are no recent neurological changes noted. The patient denies claudication symptoms or rest pain symptoms. The patient denies history of DVT, PE or superficial thrombophlebitis. The patient denies recent episodes of angina or shortness of breath.   Current Meds  Medication Sig  . amLODipine (NORVASC) 10 MG tablet Take 1 tablet (10 mg total) by mouth daily.  Marland Kitchen aspirin EC 81 MG tablet Take 1 tablet (81 mg total) by mouth daily.  Marland Kitchen atorvastatin (LIPITOR) 20 MG tablet Take 1 tablet (20 mg total) by mouth daily.  Marland Kitchen gabapentin (NEURONTIN) 100 MG capsule Take 1 capsule (100 mg total) by mouth at bedtime.  . Insulin Glargine (BASAGLAR KWIKPEN) 100 UNIT/ML SOPN Inject 0.15 mLs (15 Units total) into the skin at bedtime.  . insulin lispro (HUMALOG) 100 UNIT/ML injection Inject 0-0.2 mLs (0-20 Units total) into the skin 4 (four) times daily - after meals and at bedtime. Glucose 150-200-give 3 units subcu Glucose 201-250-give 6units subcu Glucose 251-300-give 9 units subcu Glucose 301-350-give 12 units subcu Glucose 351-400-give 15 units and call primary care provider  . labetalol (NORMODYNE) 200 MG tablet Take 1 tablet (200 mg total) by mouth 2 (two) times daily.  Marland Kitchen levETIRAcetam (KEPPRA) 250 MG tablet Take 1 tablet (250 mg total) by mouth 2 (two) times daily.  Marland Kitchen losartan (COZAAR) 100 MG tablet Take 1 tablet (100 mg total) by mouth daily.  . multivitamin (RENA-VIT) TABS tablet Take 1 tablet by mouth at bedtime.    Past Medical History:  Diagnosis  Date  . Diabetes mellitus without complication ([place])   . Hyperlipemia   . Hypertension   . Renal disorder     Past Surgical History:  Procedure Laterality Date  . DIALYSIS/PERMA CATHETER INSERTION N/A 04/07/2019   Procedure: DIALYSIS/PERMA CATHETER INSERTION;  Surgeon: Algernon Huxley, MD;  Location: [facility];  Service: Cardiovascular;  Laterality: N/A;    Social History Social History   Tobacco Use  . Smoking status: Never Smoker  . Smokeless tobacco: Never Used  Substance Use Topics  . Alcohol use: Not Currently    Comment: unable to review  . Drug use: Not on file    Comment: unable to review    Family History Family History  Problem Relation Age of Onset  . Diabetes Mother   . Hypertension Mother   . Heart attack Father   No family history of bleeding/clotting disorders, porphyria or autoimmune disease   No Known Allergies   REVIEW OF SYSTEMS (Negative unless checked)  Constitutional: [] Weight loss  [] Fever  [] Chills Cardiac: [] Chest pain   [] Chest pressure   [] Palpitations   [] Shortness of breath when laying flat   [] Shortness of breath with exertion. Vascular:  [] Pain in legs with walking   [] Pain in legs at rest  [] History of DVT   [] Phlebitis   [] Swelling in legs   [] Varicose veins   [] Non-healing ulcers Pulmonary:   [] Uses home oxygen   [] Productive cough   [] Hemoptysis   [] Wheeze  []   COPD   [] Asthma Neurologic:  [] Dizziness   [] Seizures   [] History of stroke   [] History of TIA  [] Aphasia   [] Vissual changes   [] Weakness or numbness in arm   [] Weakness or numbness in leg Musculoskeletal:   [] Joint swelling   [] Joint pain   [] Low back pain Hematologic:  [] Easy bruising  [] Easy bleeding   [] Hypercoagulable state   [] Anemic Gastrointestinal:  [] Diarrhea   [] Vomiting  [] Gastroesophageal reflux/heartburn   [] Difficulty swallowing. Genitourinary:  [x] Chronic kidney disease   [] Difficult urination  [] Frequent urination   [] Blood in urine Skin:  [] Rashes    [] Ulcers  Psychological:  [] History of anxiety   []  History of major depression.  Physical Examination  Vitals:   05/16/19 1620  BP: (!) 145/79  Pulse: 76  Resp: 16  Weight: 271 lb (122.9 kg)  Height: 5' 9.5" (1.765 m)   Body mass index is 39.45 kg/m. Gen: WD/WN, NAD Head: [place]/AT, No temporalis wasting.  Ear/Nose/Throat: Hearing grossly intact, nares w/o erythema or drainage, poor dentition Eyes: PER, EOMI, sclera nonicteric.  Neck: Supple, no masses.  No bruit or JVD.  Pulmonary:  Good air movement, clear to auscultation bilaterally, no use of accessory muscles.  Cardiac: RRR, normal S1, S2, no Murmurs. Vascular: right IJ catheter CD&I Vessel Right Left  Radial Palpable Palpable  Brachial Palpable Palpable  Carotid Palpable Palpable  Gastrointestinal: soft, non-distended. No guarding/no peritoneal signs.  Musculoskeletal: M/S 5/5 throughout.  No deformity or atrophy.  Neurologic: CN 2-12 intact. Pain and light touch intact in extremities.  Symmetrical.  Speech is fluent. Motor exam as listed above. Psychiatric: Judgment intact, Mood & affect appropriate for pt's clinical situation. Dermatologic: No rashes or ulcers noted.  No changes consistent with cellulitis. Lymph : No Cervical lymphadenopathy, no lichenification or skin changes of chronic lymphedema.  CBC Lab Results  Component Value Date   WBC 7.3 04/30/2019   HGB 9.0 (L) 04/30/2019   HCT 30.0 (L) 04/30/2019   MCV 96.9 04/30/2019   PLT 349 04/30/2019    BMET    Component Value Date/Time   NA 138 04/30/2019 1450   K 3.3 (L) 04/30/2019 1450   CL 96 (L) 04/30/2019 1450   CO2 30 04/30/2019 1450   GLUCOSE 121 (H) 04/30/2019 1450   BUN 6 04/30/2019 1450   CREATININE 2.31 (H) 04/30/2019 1450   CALCIUM 9.2 04/30/2019 1450   GFRNONAA 34 (L) 04/30/2019 1450   GFRAA 39 (L) 04/30/2019 1450   Estimated Creatinine Clearance: 54.9 mL/min (A) (by C-G formula based on SCr of 2.31 mg/dL (H)).  COAG Lab Results   Component Value Date   INR 1.1 04/10/2019    Radiology No results found.   Assessment/Plan 1. CKD (chronic kidney disease) stage V requiring chronic dialysis ([place]) Recommend:  At this time the patient does not have appropriate extremity access for dialysis  Patient should have a left brachial cephalic fistula created.  The risks, benefits and alternative therapies were reviewed in detail with the patient.  All questions were answered.  The patient agrees to proceed with surgery.    A total of 60 minutes was spent with this patient and greater than 50% was spent in counseling and coordination of care with the patient.  He had multiple questions.  Discussion included the treatment options for vascular disease including indications for surgery and intervention.  Also discussed is the appropriate timing of treatment.  In addition medical therapy was discussed.   2. Essential hypertension Continue antihypertensive medications  as already ordered, these medications have been reviewed and there are no changes at this time.   3. Type 2 diabetes mellitus with chronic kidney disease on chronic dialysis, with long-term current use of insulin (HCC) Continue hypoglycemic medications as already ordered, these medications have been reviewed and there are no changes at this time.  Hgb A1C to be monitored as already arranged by primary service   4. Mixed hyperlipidemia Continue statin as ordered and reviewed, no changes at this time    Hortencia Pilar, MD  05/16/2019 5:11 PM

## 2019-05-17 ENCOUNTER — Telehealth (INDEPENDENT_AMBULATORY_CARE_PROVIDER_SITE_OTHER): Payer: Self-pay

## 2019-05-17 NOTE — Telephone Encounter (Signed)
I attempted to contact the patient regarding scheduling him for surgery and a message was left for a return call.

## 2019-05-23 NOTE — Telephone Encounter (Signed)
I attempted to contact the patient regarding surgery and had to leave a message for a return call.

## 2019-06-01 ENCOUNTER — Ambulatory Visit: Payer: Self-pay | Admitting: Pharmacy Technician

## 2019-06-01 ENCOUNTER — Other Ambulatory Visit: Payer: Self-pay

## 2019-06-01 DIAGNOSIS — Z79899 Other long term (current) drug therapy: Secondary | ICD-10-CM

## 2019-06-01 NOTE — Progress Notes (Signed)
Met with patient completed financial assistance application for [place] due to recent hospital visit.  Patient agreed to be responsible for gathering financial information and forwarding to appropriate department in [facility].    Completed [facility] application and contract.  Patient verbally agreed to all terms of the [facility] contract.    Patient approved to receive medication assistance at [facility] as long as eligibility criteria continues to be met.    Provided patient with Civil engineer, contracting based on his particular needs.    Made patient aware of [facility]  Patient not interested in going to [facility].  Patient stated that he is seeing a provider at [facility] and does not want to change providers.   Referred patient for MTM.  [facility]

## 2019-07-18 ENCOUNTER — Encounter: Payer: Self-pay | Admitting: Pharmacist

## 2019-07-18 ENCOUNTER — Other Ambulatory Visit: Payer: Self-pay

## 2019-07-18 ENCOUNTER — Ambulatory Visit: Payer: Self-pay | Admitting: Pharmacist

## 2019-07-18 DIAGNOSIS — Z79899 Other long term (current) drug therapy: Secondary | ICD-10-CM

## 2019-07-18 NOTE — Progress Notes (Signed)
  [facility] Visit Note  Patient: Roy Oconnell MRN: [id number] Date of Birth: Dec 24, 1975 PCP: Gennaro Africa, MD   Roy Oconnell 43 y.o. male presents for a medication therapy management visit today.  There were no vitals taken for this visit.  Patient Information   Past Medical History:  Diagnosis Date  . Diabetes mellitus without complication ([place])   . Hyperlipemia   . Hypertension   . Renal disorder       Past Surgical History:  Procedure Laterality Date  . DIALYSIS/PERMA CATHETER INSERTION N/A 04/07/2019   Procedure: DIALYSIS/PERMA CATHETER INSERTION;  Surgeon: Algernon Huxley, MD;  Location: [facility] [facility];  Service: Cardiovascular;  Laterality: N/A;               Social History   Substance and Sexual Activity  Alcohol Use Not Currently   Comment: unable to review      Social History   Tobacco Use  Smoking Status Never Smoker  Smokeless Tobacco Never Used      Health Maintenance  Topic Date Due  . PNEUMOCOCCAL POLYSACCHARIDE VACCINE AGE 46-64 HIGH RISK  10/30/1979  . FOOT EXAM  10/30/1987  . OPHTHALMOLOGY EXAM  10/30/1987  . TETANUS/TDAP  04/28/2019  . INFLUENZA VACCINE  05/21/2019  . HEMOGLOBIN A1C  10/01/2019  . HIV Screening  Completed     Assessment and Plan:  Diabetes:  Patient recently was hospitalized with diabetes in June.  A1c 14.8%.  Current FBG 190-212.  PPBG 220-230 (patient reported).  He is currently taking humalog and basaglar, and will follow up with his primary care in November for repeat A1c.  Hypertension:  Labetalol and losartan  HLD:  Atorvastatin

## 2019-07-19 ENCOUNTER — Other Ambulatory Visit: Payer: Self-pay

## 2019-08-18 NOTE — Telephone Encounter (Signed)
I have attempted to contact the patient on multiple occasions. Upon looking further it appears the patient is now being seen at [facility].

## 2019-10-31 ENCOUNTER — Ambulatory Visit: Payer: Medicaid Other

## 2019-10-31 ENCOUNTER — Other Ambulatory Visit: Payer: Self-pay

## 2019-10-31 DIAGNOSIS — Z79899 Other long term (current) drug therapy: Secondary | ICD-10-CM

## 2019-10-31 DIAGNOSIS — J4520 Mild intermittent asthma, uncomplicated: Secondary | ICD-10-CM | POA: Insufficient documentation

## 2019-10-31 DIAGNOSIS — E669 Obesity, unspecified: Secondary | ICD-10-CM | POA: Insufficient documentation

## 2019-10-31 DIAGNOSIS — Z8619 Personal history of other infectious and parasitic diseases: Secondary | ICD-10-CM | POA: Insufficient documentation

## 2019-10-31 DIAGNOSIS — J1282 Pneumonia due to coronavirus disease 2019: Secondary | ICD-10-CM | POA: Insufficient documentation

## 2019-10-31 NOTE — Progress Notes (Signed)
[facility] Visit Note  Patient: Roy Oconnell MRN: [id number] Date of Birth: 04-17-1976 PCP: Roy Africa, MD   Roy Oconnell 44 y.o. male presents for a telephone medication therapy management visit with the pharmacist today. Patient identified using two patient identifiers. He reports he is currently visiting family in [place] and was recently hospitalized in 09/2019 at [facility] for 7-8 days with COVID pneumonia and subsequently developed C diff.    There were no vitals taken for this visit.  Patient Information   Past Medical History:  Diagnosis Date  . Diabetes mellitus without complication ([place])   . Hyperlipemia   . Hypertension   . Renal disorder      Past Surgical History:  Procedure Laterality Date  . DIALYSIS/PERMA CATHETER INSERTION N/A 04/07/2019   Procedure: DIALYSIS/PERMA CATHETER INSERTION;  Surgeon: Algernon Huxley, MD;  Location: [facility];  Service: Cardiovascular;  Laterality: N/A;     Family History  Problem Relation Age of Onset  . Diabetes Mother   . Hypertension Mother   . Heart attack Father     New Diagnoses (since last visit): COVID pneumonia. Clostridium difficile.   Family Support: Poor. Good friend support system.   Lifestyle Diet: Breakfast: Typically skips breakfast Lunch: Leftovers Dinner: Chicken, rice, vegetables, tries to avoid starches Drinks: Cut back on sodas, water Desserts: Infrequently  Exercise: Patient is trying to increase exercise frequency. Walks 1 mile daily. Exercise is limited by fatigue.   Social History   Substance and Sexual Activity  Alcohol Use Not Currently   Comment: unable to review   Denies alcohol use   Social History   Tobacco Use  Smoking Status Never Smoker  Smokeless Tobacco Never Used   Denies tobacco use    Health Maintenance  Topic Date Due  . PNEUMOCOCCAL POLYSACCHARIDE VACCINE AGE 73-64 HIGH RISK  10/30/1979  . FOOT EXAM   10/30/1987  . OPHTHALMOLOGY EXAM  10/30/1987  . TETANUS/TDAP  04/28/2019  . INFLUENZA VACCINE  05/21/2019  . HEMOGLOBIN A1C  10/01/2019  . HIV Screening  Completed   Outpatient Encounter Medications as of 10/31/2019  Medication Sig  . amLODipine (NORVASC) 10 MG tablet Take 1 tablet (10 mg total) by mouth daily. (Patient taking differently: Take 5 mg by mouth daily. )  . atorvastatin (LIPITOR) 20 MG tablet Take 1 tablet (20 mg total) by mouth daily.  Marland Kitchen gabapentin (NEURONTIN) 100 MG capsule Take 1 capsule (100 mg total) by mouth at bedtime.  Marland Kitchen guaiFENesin-dextromethorphan (ROBITUSSIN DM) 100-10 MG/5ML syrup Take 5 mLs by mouth every 4 (four) hours as needed for cough.  Marland Kitchen ibuprofen (ADVIL) 200 MG tablet Take 400 mg by mouth every 6 (six) hours as needed for headache.  . Insulin Glargine (BASAGLAR KWIKPEN) 100 UNIT/ML SOPN Inject 0.15 mLs (15 Units total) into the skin at bedtime.  . insulin lispro (HUMALOG) 100 UNIT/ML injection Inject 0-0.2 mLs (0-20 Units total) into the skin 4 (four) times daily - after meals and at bedtime. Glucose 150-200-give 3 units subcu Glucose 201-250-give 6units subcu Glucose 251-300-give 9 units subcu Glucose 301-350-give 12 units subcu Glucose 351-400-give 15 units and call primary care provider  . labetalol (NORMODYNE) 200 MG tablet Take 1 tablet (200 mg total) by mouth 2 (two) times daily.  Marland Kitchen levETIRAcetam (KEPPRA) 250 MG tablet Take 1 tablet (250 mg total) by mouth 2 (two) times daily.  . multivitamin (RENA-VIT) TABS tablet Take 1 tablet by mouth at bedtime.  Marland Kitchen  vancomycin (VANCOCIN) 50 mg/mL oral solution Take 250 mg by mouth every 6 (six) hours.  Marland Kitchen aspirin EC 81 MG tablet Take 1 tablet (81 mg total) by mouth daily. (Patient not taking: Reported on 10/31/2019)  . losartan (COZAAR) 100 MG tablet Take 1 tablet (100 mg total) by mouth daily. (Patient not taking: Reported on 10/31/2019)   No facility-administered encounter medications on file as of 10/31/2019.     Health Maintenance/Date Completed  Last ED visit: Recently hospitalized in 09/2019 for COVID pneumonia and C diff infection Last Visit to PCP: 05/06/2019 (Dr. Alanson Aly at [facility]) Next Visit to PCP: To be scheduled Specialist Visit: Nephrology. Next appointment in 12/2019  Dental Exam: Yes. Goes to the community [facility] clinic in [place].  Eye Exam: Does not wear glasses. Reports last diabetic eye exam was in 2019 Prostate Exam: Denies recent exam. No family hx of prostate cancer Colonoscopy: Never. No family hx of colon cancer Flu Vaccine: Did not receive flu vaccine this year. Recommended patient pursue receipt of vaccine Pneumonia Vaccine: Received while hospitalized in [place]  Assessment and Plan:  1. Recent hospitalization for COVID pneumonia and Clostridium difficile -Patient reports he was hospitalized at [facility] in 09/2019 for 7-8 days with COVID pneumonia and C diff infection -He is completing oral vancomycin course for C diff infection. Diarrhea has resolved. -Residual cough from COVID pneumonia. Denies shortness of breath, fevers, chills  2. Type-2 Diabetes Mellitus, un-controlled -Last Hgb A1C in chart was 14.8% on 04/01/2019. He reports that most recent value was 9.2% although un-able to find this lab in chart -Hospitalized at [facility] 04/01/2019-04/15/2019 for DKA, septic shock, ARF requiring dialysis, and new-onset seizures -Antidiabetic regimen includes insulin glargine (Basaglar) 15 units at bedtime and insulin lispro (Humalog) sliding scale TID with meals and HS -He is checking blood sugar 4x daily (before meals and before bed) and keeps a log. Reports AM blood sugar runs 200-220s and values throughout the rest of the day are 180-210.  -Denies signs/symptoms of hypoglycemia (shakiness, sweatiness) -Denies signs/symptoms of hyperglycemia (polyuria, polydipsia, polyphagia) -Endorses lifestyle interventions including increasing  exercise frequency and reducing soda consumption and avoiding starchy foods  -Diabetic foot exams at PCP and denies any foot wounds -Due for an annual diabetic eye exam -Gabapentin 100 mg HS for neuropathy -Moderate intensity statin, atorvastatin 20 mg for ASCVD prevention -No albumin creatinine ratio in chart. Was on losartan 100 mg daily which patient reports was discontinued secondary to hypotension  3. Hypertension -Antihypertensives include amlodipine 5 mg daily and labetalol 200 mg twice daily -Amlodipine was decreased to 5 mg daily and losartan 100 mg daily was discontinued secondary to hypotension -Denies recent signs/symptoms of hypotension -He is staying with family in [place] and family member is CNA who checks his BP periodically. Reports BP has been running 140/90s -Endorses he owns a blood pressure monitoring device but this is at home in [place] -Reports some mild lower extremity edema around ankles -Encouraged patient to increase frequency of blood pressure monitoring and to keep a log  4. CKD Stage III -Follows with [facility] in [place] and reports next appointment is in 12/2019 -History of solitary kidney s/p donor nephrectomy and suspected diabetic kidney disease -Initiated on dialysis during hospitalization at [facility] 04/01/2019-04/15/2019 due to ARF and continued on outpatient dialysis thru 06/04/2019 which was discontinued after renal recovery  5. Hyperlipidemia -Last lipid panel in chart from 08/27/2018 with HDL 41, LDL 181 -He is on a moderate intensity  statin, atorvastatin 20 mg daily  6. New-onset seizures -Seizure like activity reported prior to June 2020 hospitalization thought to be provoked by DKA -Neurology started patient on levetiracetam while in-patient and patient was discharged on levetiracetam 250 mg twice daily -Denies any recent seizure activity   7. Adherence -Endorses taking medications as prescribed -Utilizing a pillbox -Requiring refills on  levetiracetam, gabapentin, labetalol   [place] Resident 31 October 2019

## 2019-11-03 ENCOUNTER — Other Ambulatory Visit: Payer: Self-pay

## 2020-01-09 ENCOUNTER — Other Ambulatory Visit: Payer: Self-pay | Admitting: Family Medicine

## 2020-01-16 ENCOUNTER — Telehealth: Payer: Self-pay | Admitting: Pharmacy Technician

## 2020-01-16 NOTE — Telephone Encounter (Signed)
Received updated proof of income.  Patient eligible to receive medication assistance at [facility] until time for re-certification in 9359, and as long as eligibility requirements continue to be met.  [place] [facility]

## 2020-01-20 ENCOUNTER — Telehealth: Payer: Self-pay | Admitting: Pharmacy Technician

## 2020-01-20 NOTE — Telephone Encounter (Signed)
Spoke with Jonelle Sidle at [employer].  Jonelle Sidle stated that Prescription Drug Coverage RX [phone] only covers flu vaccines.  It does not cover any medications.  [place] [facility]

## 2020-03-06 ENCOUNTER — Ambulatory Visit: Payer: Medicaid Other | Admitting: Pharmacist

## 2020-03-06 DIAGNOSIS — Z79899 Other long term (current) drug therapy: Secondary | ICD-10-CM

## 2020-03-06 NOTE — Progress Notes (Addendum)
[facility]ication Management Clinic Visit Note  Patient: Roy Oconnell MRN: [id number] Date of Birth: [birth year] PCP: Gennaro Africa, MD   [place] F[place] Reek 44 y.o. male presents for a MTM follow-up visit today. Patient was identified by name and DOB.   There were no vitals taken for this visit.  Patient Information   Past Medical History:  Diagnosis Date  . Diabetes mellitus without complication ([place])   . Hyperlipemia   . Hypertension   . Renal disorder       Past Surgical History:  Procedure Laterality Date  . DIALYSIS/PERMA CATHETER INSERTION N/A 04/07/2019   Procedure: DIALYSIS/PERMA CATHETER INSERTION;  Surgeon: Algernon Huxley, MD;  Location: [facility];  Service: Cardiovascular;  Laterality: N/A;     Family History  Problem Relation Age of Onset  . Diabetes Mother   . Hypertension Mother   . Heart attack Father    Outpatient Encounter Medications as of 03/06/2020  Medication Sig  . amLODipine (NORVASC) 10 MG tablet Take 1 tablet (10 mg total) by mouth daily. (Patient taking differently: Take 5 mg by mouth daily. )  . atorvastatin (LIPITOR) 20 MG tablet Take 1 tablet (20 mg total) by mouth daily.  Marland Kitchen gabapentin (NEURONTIN) 100 MG capsule Take 1 capsule (100 mg total) by mouth at bedtime.  Marland Kitchen ibuprofen (ADVIL) 200 MG tablet Take 400 mg by mouth every 6 (six) hours as needed for headache.  . Insulin Glargine (BASAGLAR KWIKPEN) 100 UNIT/ML SOPN Inject 0.15 mLs (15 Units total) into the skin at bedtime.  . insulin lispro (HUMALOG) 100 UNIT/ML injection Inject 0-0.2 mLs (0-20 Units total) into the skin 4 (four) times daily - after meals and at bedtime. Glucose 150-200-give 3 units subcu Glucose 201-250-give 6units subcu Glucose 251-300-give 9 units subcu Glucose 301-350-give 12 units subcu Glucose 351-400-give 15 units and call primary care provider  . labetalol (NORMODYNE) 200 MG tablet Take 1 tablet (200 mg total) by mouth 2 (two) times daily.  Marland Kitchen levETIRAcetam  (KEPPRA) 250 MG tablet Take 1 tablet (250 mg total) by mouth 2 (two) times daily.  . multivitamin (RENA-VIT) TABS tablet Take 1 tablet by mouth at bedtime.  Marland Kitchen aspirin EC 81 MG tablet Take 1 tablet (81 mg total) by mouth daily. (Patient not taking: Reported on 10/31/2019)  . [DISCONTINUED] guaiFENesin-dextromethorphan (ROBITUSSIN DM) 100-10 MG/5ML syrup Take 5 mLs by mouth every 4 (four) hours as needed for cough.  . [DISCONTINUED] losartan (COZAAR) 100 MG tablet Take 1 tablet (100 mg total) by mouth daily. (Patient not taking: Reported on 10/31/2019)  . [DISCONTINUED] vancomycin (VANCOCIN) 50 mg/mL oral solution Take 250 mg by mouth every 6 (six) hours.   No facility-administered encounter medications on file as of 03/06/2020.   Health Maintenance/Date Completed  Last ED visit: 04/01/2019 Last Visit to PCP: January 2021  Next Visit to PCP: not yet scheduled Specialist Visit: Last visit March 2021  New Diagnoses (since last visit):   Family Support: Good  Lifestyle Exercises 4 days a week 1-2 hrs  Diet: Breakfast: typically doesn't eat breakfast intermittent fasting Lunch: late lunch Dinner: chicken, green beans Drinks: water, been cutting out soda           Social History   Substance and Sexual Activity  Alcohol Use Not Currently      Social History   Tobacco Use  Smoking Status Never Smoker  Smokeless Tobacco Never Used      Health Maintenance  Topic Date Due  . PNEUMOCOCCAL POLYSACCHARIDE  VACCINE AGE 20-64 HIGH RISK  Never done  . FOOT EXAM  Never done  . OPHTHALMOLOGY EXAM  Never done  . URINE MICROALBUMIN  Never done  . COVID-19 Vaccine (1) Never done  . TETANUS/TDAP  04/28/2019  . HEMOGLOBIN A1C  10/01/2019  . INFLUENZA VACCINE  05/20/2020  . HIV Screening  Completed     Assessment and Plan:  1. Type-2 Diabetes Mellitus -Last Hgb A1C in chart was 9.8% on 08/26/2019. Pt says its been hard for him to go to his primary care due to transportation and  telemedicine visits. He states his last primary visit was in January 2021 through telemedicine. His last nephrologist visit was March 24. He plans on scheduling a visit soon to get another A1C drawing and update his labs.  -Antidiabetic regimen includes insulin glargine (Basaglar) 15 units at bedtime and insulin lispro (Humalog) sliding scale 3 times daily with meals and at bedtime. -He is checking blood sugar 4 times daily (before meals and before bed) and keeps a log. Reports AM blood sugar runs 150-160 which is down from the last time we spoke.  -Denies signs/symptoms of hypoglycemia (shakiness, sweatiness) -Denies signs/symptoms of hyperglycemia (polyuria, polydipsia, polyphagia) -Endorses lifestyle interventions including increasing exercise frequency and reducing soda consumption and avoiding starchy foods  -Diabetic foot exams at PCP, reports last time in 2019 -Due for an annual diabetic eye exam, reports last time in 2019 -Gabapentin 100 mg at bedtime for neuropathy -Moderate intensity statin, atorvastatin 20 mg for ASCVD prevention.   2. Hypertension -Antihypertensives include amlodipine 5 mg daily and labetalol 200 mg twice daily -Amlodipine was decreased to 5 mg daily and losartan 100 mg daily was discontinued secondary to hypotension -Denies recent signs/symptoms of hypotension - Pt states recent BP has been "high" but is aware that he needs to improve his diet.   3. 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 Hyperlipidemia -Moderate intensity statin, atorvastatin 20 mg for ASCVD prevention.  -Last lipid panel in chart from 08/27/2018 with TC 263, TG 207, HDL 41, LDL 181 (calculated). Pt has not had a recent lipid panel and will inquire about one at the next visit.   5. New-onset  seizures -Pt reports being stable on his Keppra and has no complaints.  -Denies any recent seizure activity   6. Adherence/Adverse Effects/Misc -Endorses taking medications as prescribed -Utilizing a pillbox since last MTM and states that his adherence has been a lot better ever since.  -Reports no adverse effects and is content with his current regimen. Informed pt that [facility] is able to provide test strips and lancets.   RTC 6 months  Willow Ora (Ty) Rosene Pilling PharmD Candidate [facility]  Cosigned: Kristin Bruins K. Dicky Doe, PharmD [facility] [place] Operations Coordinator [phone]

## 2020-03-07 ENCOUNTER — Other Ambulatory Visit: Payer: Self-pay

## 2020-03-12 ENCOUNTER — Other Ambulatory Visit: Payer: Self-pay

## 2020-07-04 ENCOUNTER — Other Ambulatory Visit: Payer: Self-pay | Admitting: Family Medicine

## 2020-09-24 ENCOUNTER — Other Ambulatory Visit: Payer: Self-pay | Admitting: Family Medicine

## 2020-10-08 ENCOUNTER — Other Ambulatory Visit: Payer: Self-pay | Admitting: Family Medicine

## 2021-01-16 ENCOUNTER — Other Ambulatory Visit: Payer: Self-pay | Admitting: Family Medicine

## 2021-01-22 ENCOUNTER — Other Ambulatory Visit: Payer: Self-pay

## 2021-01-22 MED ORDER — COMFORT EZ PEN NEEDLES 32G X 4 MM MISC: 11 refills | Status: AC

## 2021-03-01 ENCOUNTER — Other Ambulatory Visit: Payer: Self-pay

## 2021-03-01 MED FILL — Labetalol HCl Tab 200 MG: ORAL | 30 days supply | Qty: 60 | Fill #0 | Status: AC

## 2021-03-04 ENCOUNTER — Other Ambulatory Visit: Payer: Self-pay

## 2021-03-06 ENCOUNTER — Telehealth: Payer: Self-pay | Admitting: Pharmacy Technician

## 2021-03-06 NOTE — Telephone Encounter (Signed)
Patient stated that he started a job a couple of weeks ago.  Is going to collect a month's work of pay stubs and provide to [facility].  Told patient that we would approve him till the end of June, granting him time to provide requested financial documentation.  Explained that if pay stubs were not provided by April 18, 2021 then [facility] would not longer be able to provide medication assistance.  Patient verbally acknowledged that he understood.  [facility]

## 2021-03-08 ENCOUNTER — Other Ambulatory Visit: Payer: Self-pay

## 2021-03-08 IMAGING — CT CT HEAD WITHOUT CONTRAST
3 series · 16 of 47 positions shown, 19 images · non-contrast
Comparison: None.

CLINICAL DATA: Unresponsive.  Possible seizures.

EXAM:
CT HEAD WITHOUT CONTRAST
TECHNIQUE: Contiguous axial images were obtained from the base of the skull
through the vertex without intravenous contrast.

[Series 3: head wo · axial · 0.44mm/px · z∈[-73,+62]mm · 10 of 33 slices shown, 13 images]
[im 3/33  brain]
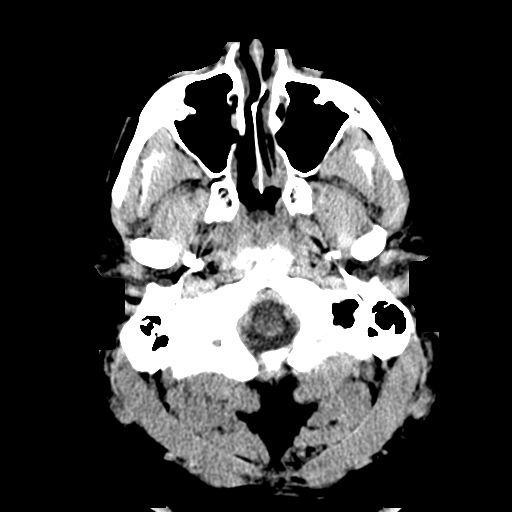
[im 3/33  bone]
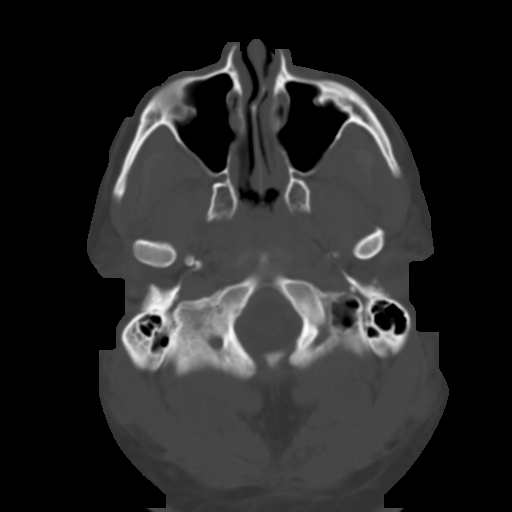
[im 6/33  brain]
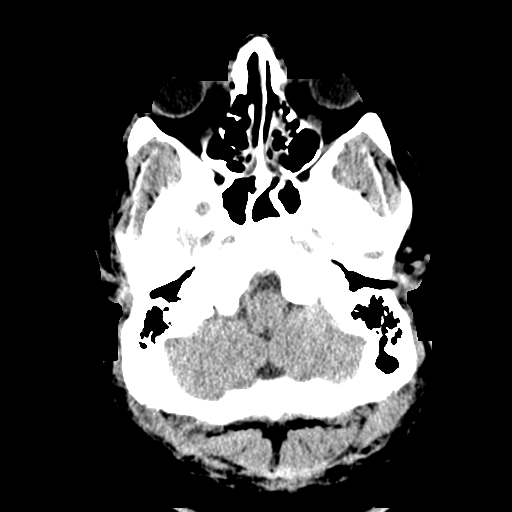
[im 9/33  brain]
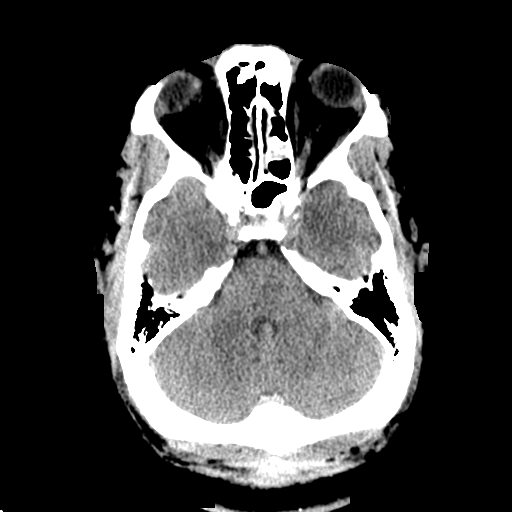
[im 12/33  brain]
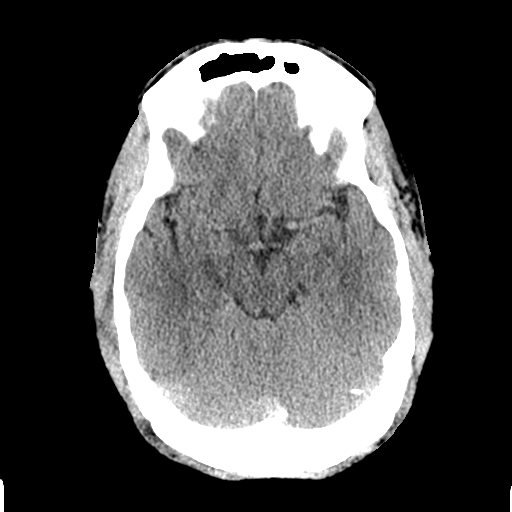
[im 15/33  brain]
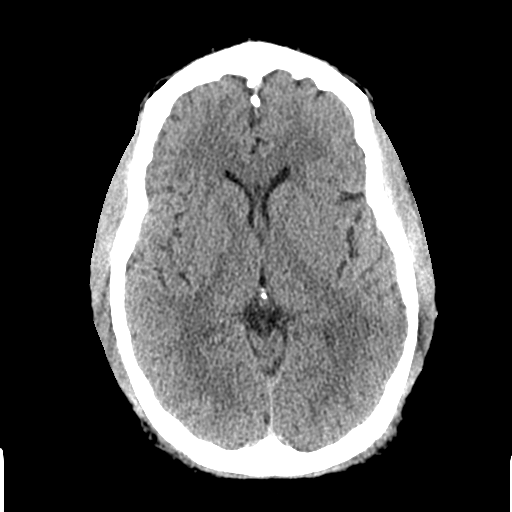
[im 15/33  bone]
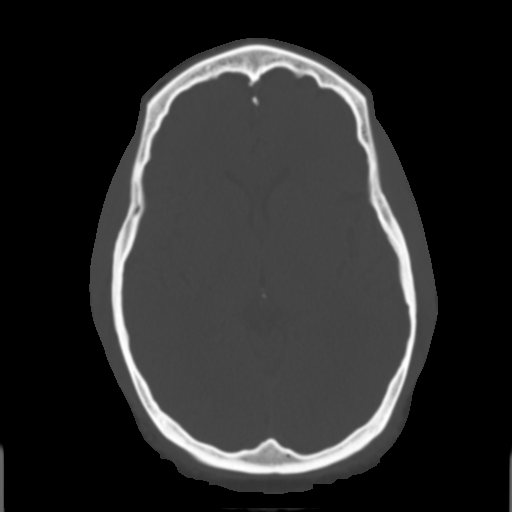
[im 18/33  brain]
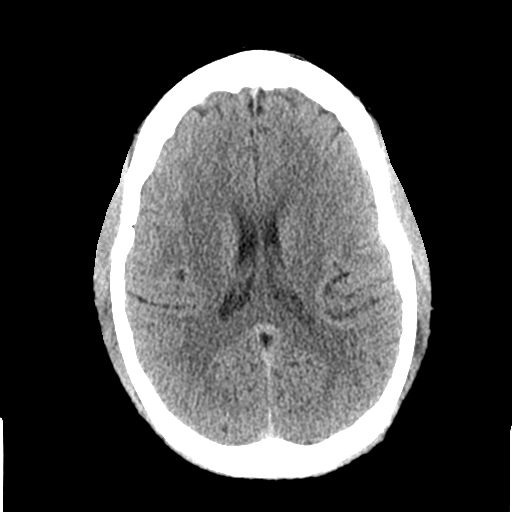
[im 21/33  brain]
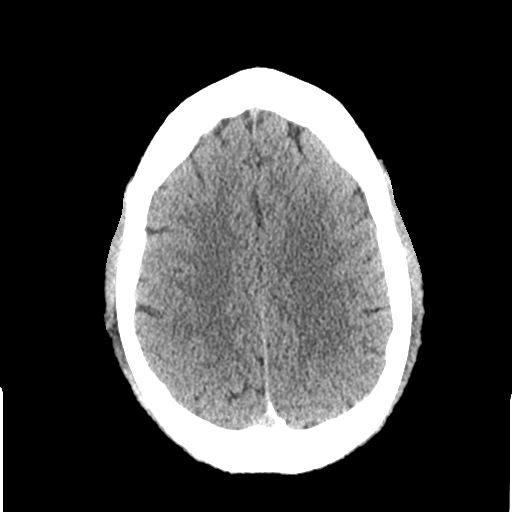
[im 25/33  brain]
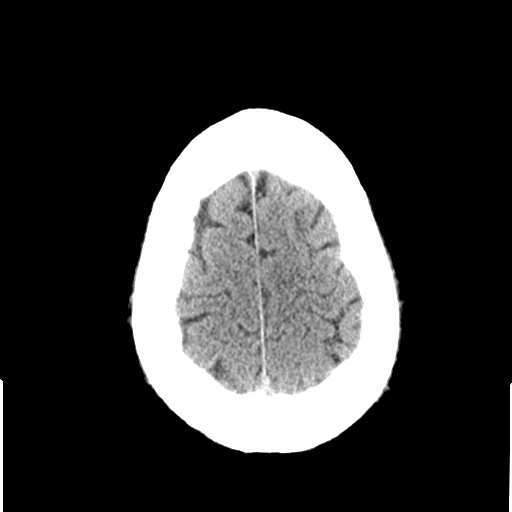
[im 27/33  brain]
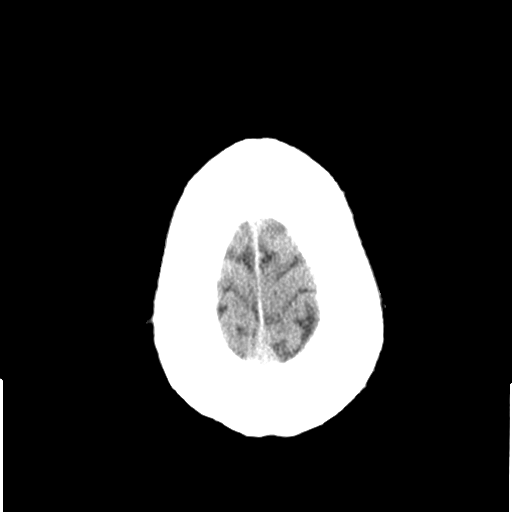
[im 27/33  bone]
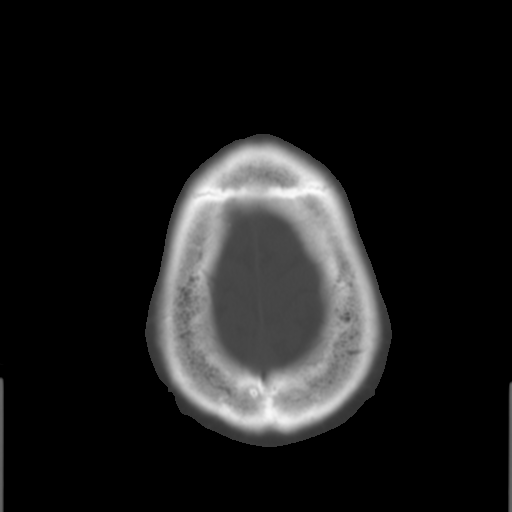
[im 30/33  brain]
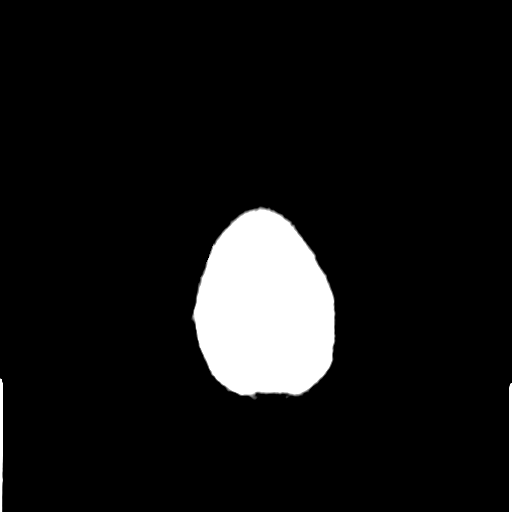

[Series 4: coronal soft tissue · coronal · 0.31mm/px · 3 of 68 slices shown]
[im 23/68  brain]
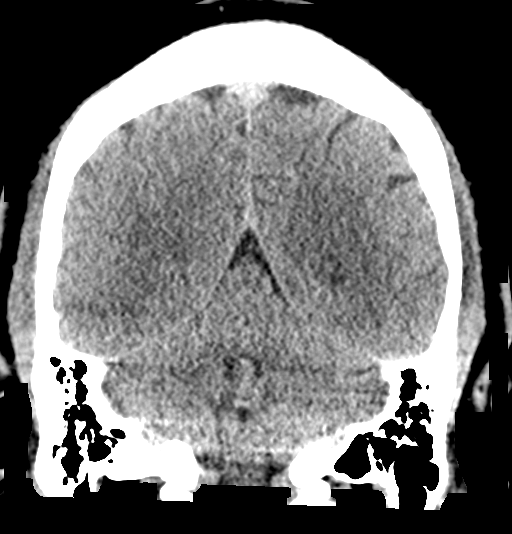
[im 30/68  brain]
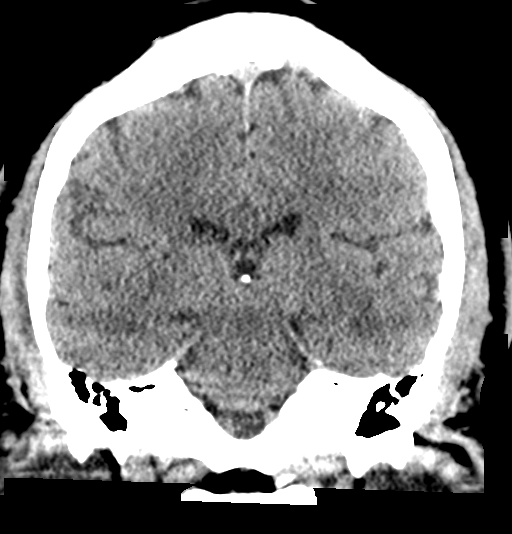
[im 38/68  brain]
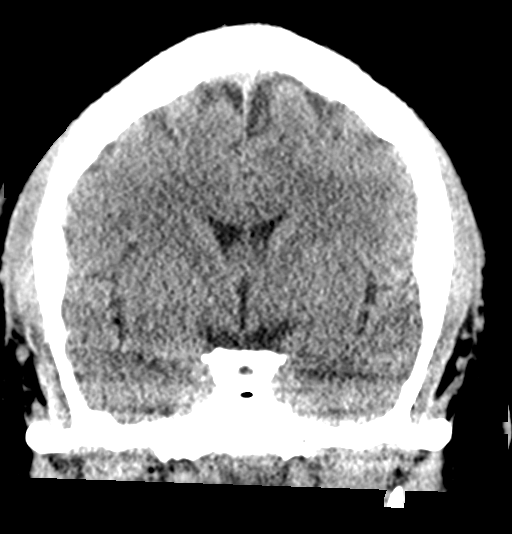

[Series 5: sagittal soft tissue · sagittal · 0.33mm/px · 3 of 52 slices shown]
[im 18/52  brain]
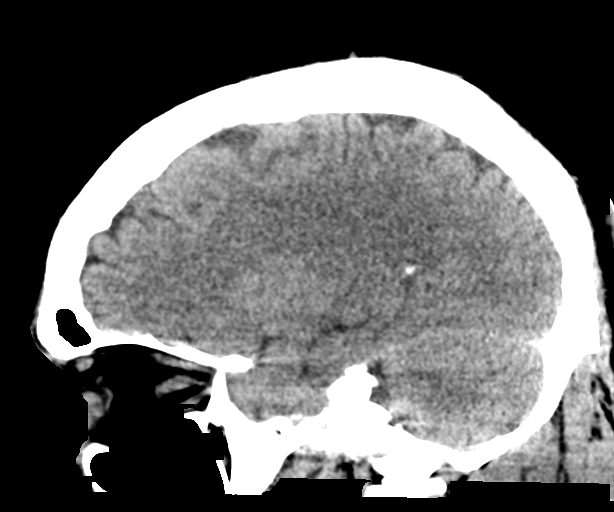
[im 26/52  brain]
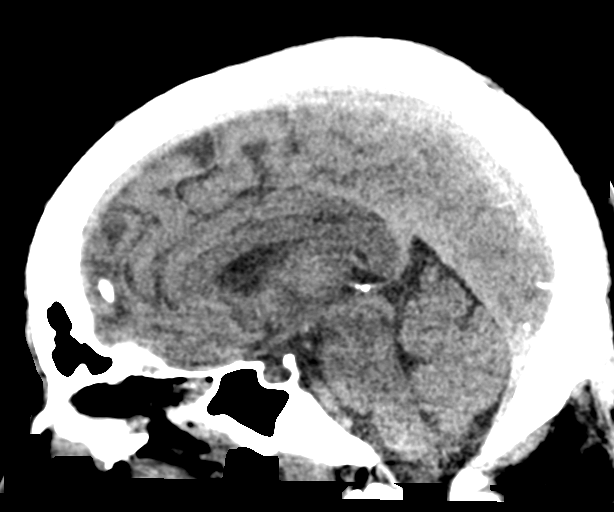
[im 35/52  brain]
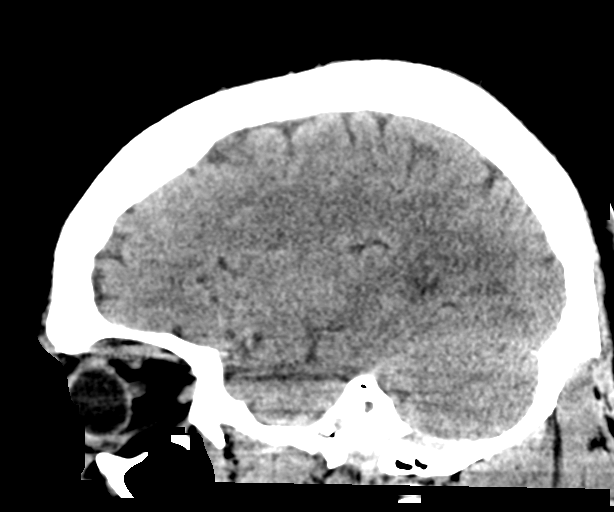

[16 of 47 positions shown; findings below may reference images not displayed]

FINDINGS: Brain: No acute infarct, hemorrhage, or mass lesion is present. No
significant white matter lesions are present. The ventricles are of
normal size. No significant extraaxial fluid collection is present.
The brainstem and cerebellum are within normal limits.

Vascular: No hyperdense vessel or unexpected calcification.

Skull: Calvarium is intact. No focal lytic or blastic lesions are
present. Remote right-sided nasal bone fracture is noted.

Sinuses/Orbits: The paranasal sinuses and mastoid air cells are
clear. The globes and orbits are within normal limits.
IMPRESSION: 1. Normal CT appearance the brain.

## 2021-03-08 IMAGING — DX CHEST  1 VIEW
1 series · 1 of 1 positions shown · non-contrast
Comparison: None.

CLINICAL DATA: Unresponsive.

EXAM:
CHEST  1 VIEW

[chest ap]
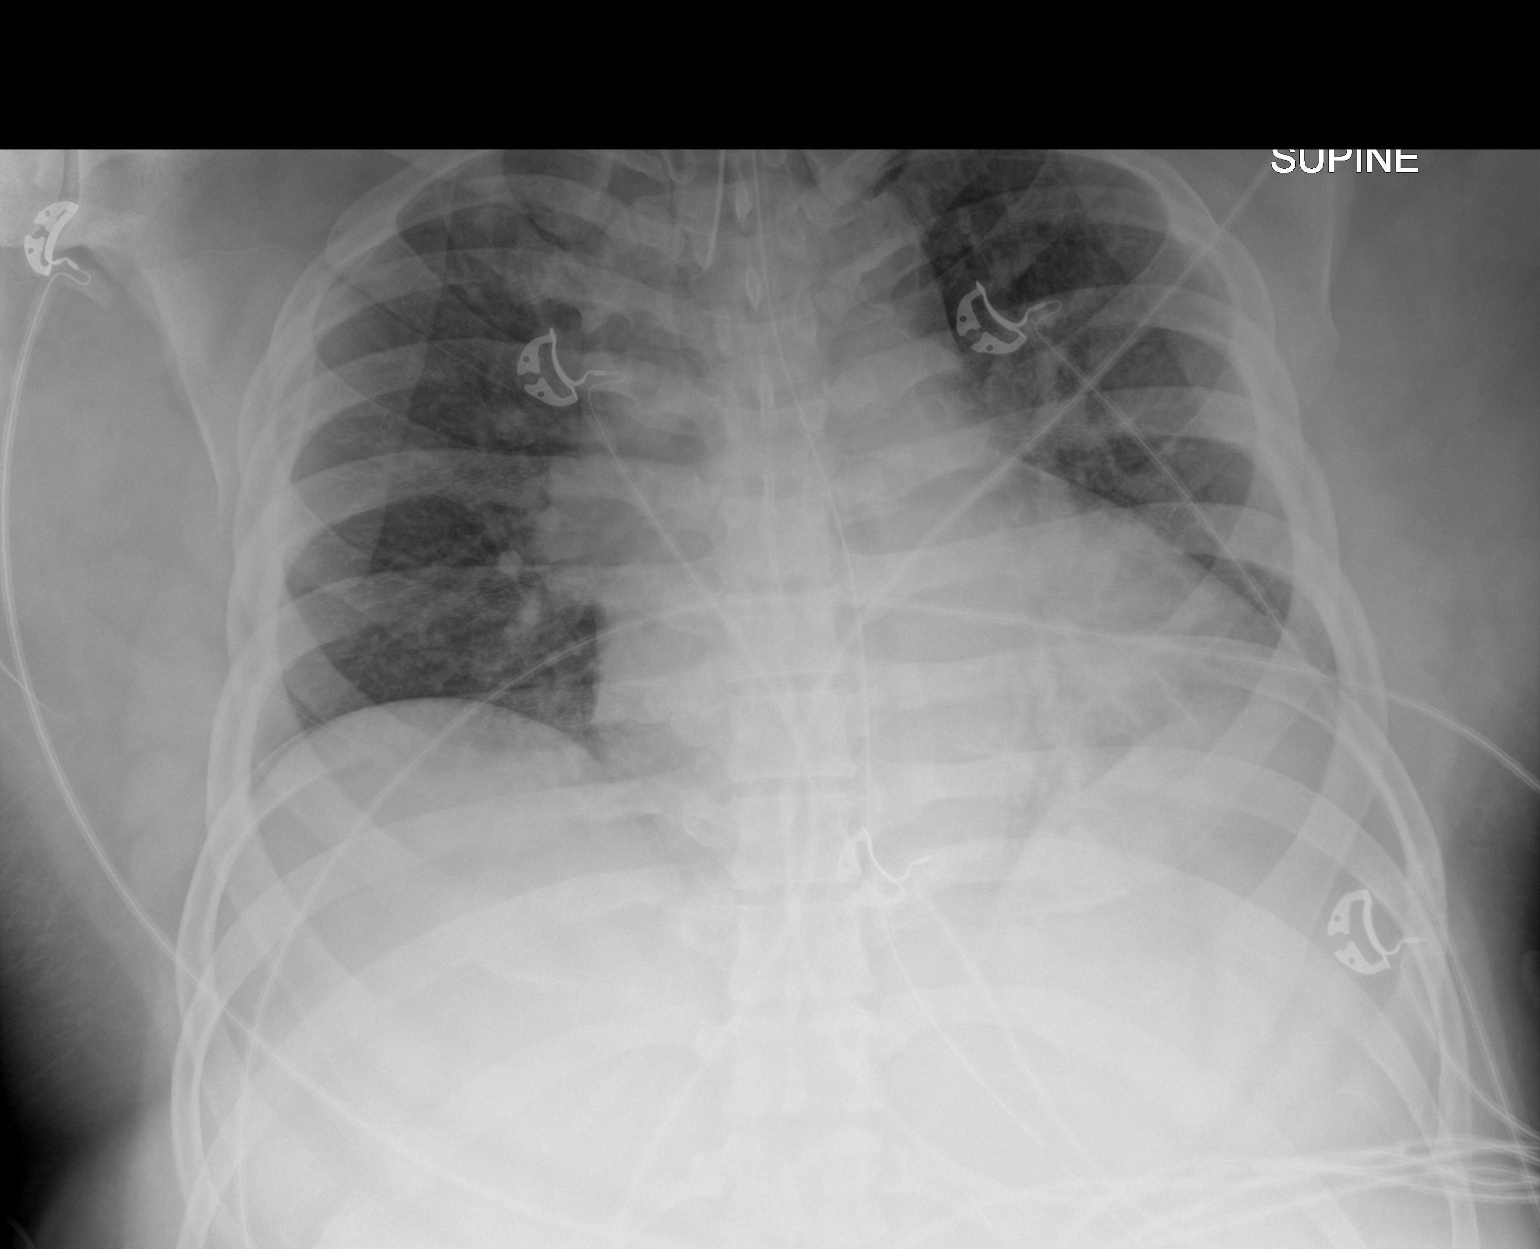

[1 of 1 positions shown; findings below may reference images not displayed]

FINDINGS: The patient is intubated. Endotracheal tube terminates 3.4 cm above
the carina. Side port of the NG tube is beyond the GE junction. The
heart is enlarged. This is exaggerated by low lung volumes.
Perihilar prominence is noted bilaterally. Minimal basilar airspace
opacities likely reflect atelectasis.
IMPRESSION: 1. Low lung volumes.
2. Cardiomegaly without failure.
3. Moderate perihilar prominence. This may be due to pulmonary
vascular congestion and low lung volumes. Adenopathy or mass lesion
is not excluded.
4. Bibasilar airspace opacities likely reflect atelectasis.

## 2021-03-11 ENCOUNTER — Other Ambulatory Visit: Payer: Self-pay

## 2021-03-12 ENCOUNTER — Other Ambulatory Visit: Payer: Self-pay

## 2021-04-01 ENCOUNTER — Other Ambulatory Visit: Payer: Self-pay

## 2021-04-08 ENCOUNTER — Other Ambulatory Visit: Payer: Self-pay

## 2021-04-08 MED ORDER — JARDIANCE 10 MG PO TABS: ORAL_TABLET | ORAL | 1 refills | Status: DC

## 2021-04-08 MED ORDER — BASAGLAR KWIKPEN 100 UNIT/ML ~~LOC~~ SOPN
PEN_INJECTOR | SUBCUTANEOUS | 3 refills | Status: AC
  Filled 2021-04-08: qty 15, 100d supply, fill #0
  Filled 2021-08-29: qty 15, 100d supply, fill #1
  Filled 2022-02-24: qty 15, 100d supply, fill #2

## 2021-04-08 MED ORDER — ATORVASTATIN CALCIUM 20 MG PO TABS
ORAL_TABLET | ORAL | 0 refills | Status: DC
  Filled 2021-04-08: qty 90, 90d supply, fill #0

## 2021-04-08 MED ORDER — INSULIN LISPRO (1 UNIT DIAL) 100 UNIT/ML (KWIKPEN)
PEN_INJECTOR | SUBCUTANEOUS | 12 refills | Status: AC
  Filled 2021-04-08: qty 15, 34d supply, fill #0

## 2021-04-11 ENCOUNTER — Other Ambulatory Visit: Payer: Self-pay

## 2021-04-18 ENCOUNTER — Other Ambulatory Visit: Payer: Self-pay

## 2021-04-18 MED ORDER — INSULIN PEN NEEDLE 32G X 4 MM MISC
99 refills | Status: AC
  Filled 2021-04-18: qty 100, 25d supply, fill #0
  Filled 2021-06-26: qty 100, 25d supply, fill #1
  Filled 2021-11-28: qty 100, 25d supply, fill #2
  Filled 2022-02-07: qty 100, 25d supply, fill #3

## 2021-04-18 MED ORDER — ATORVASTATIN CALCIUM 20 MG PO TABS
ORAL_TABLET | ORAL | 1 refills | Status: DC
  Filled 2021-07-18: qty 90, 90d supply, fill #0
  Filled 2021-10-28: qty 90, 90d supply, fill #1

## 2021-04-18 MED FILL — Losartan Potassium Tab 50 MG: ORAL | 90 days supply | Qty: 90 | Fill #0 | Status: AC

## 2021-04-18 MED FILL — Labetalol HCl Tab 200 MG: ORAL | 30 days supply | Qty: 60 | Fill #1 | Status: AC

## 2021-04-23 ENCOUNTER — Other Ambulatory Visit: Payer: Self-pay

## 2021-04-23 MED ORDER — AMLODIPINE BESYLATE 10 MG PO TABS
ORAL_TABLET | ORAL | 6 refills | Status: DC
  Filled 2021-04-23: qty 30, 30d supply, fill #0
  Filled 2021-05-22: qty 30, 30d supply, fill #1
  Filled 2021-06-26: qty 30, 30d supply, fill #2
  Filled 2021-07-22: qty 30, 30d supply, fill #3
  Filled 2021-08-26: qty 30, 30d supply, fill #4
  Filled 2021-09-25: qty 60, 60d supply, fill #5

## 2021-05-22 ENCOUNTER — Other Ambulatory Visit: Payer: Self-pay

## 2021-05-22 MED FILL — Labetalol HCl Tab 200 MG: ORAL | 30 days supply | Qty: 60 | Fill #2 | Status: AC

## 2021-05-23 ENCOUNTER — Other Ambulatory Visit: Payer: Self-pay

## 2021-05-23 MED ORDER — JARDIANCE 10 MG PO TABS
10.0000 mg | ORAL_TABLET | ORAL | 3 refills | Status: AC
  Filled 2021-05-23: qty 90, 90d supply, fill #0
  Filled 2021-08-26: qty 90, 90d supply, fill #1
  Filled 2021-09-30 – 2021-11-27 (×2): qty 90, 90d supply, fill #2
  Filled 2022-02-24 – 2022-02-25 (×2): qty 90, 90d supply, fill #3

## 2021-05-24 ENCOUNTER — Other Ambulatory Visit: Payer: Self-pay

## 2021-06-26 MED FILL — Labetalol HCl Tab 200 MG: ORAL | 30 days supply | Qty: 60 | Fill #3 | Status: AC

## 2021-06-27 ENCOUNTER — Other Ambulatory Visit: Payer: Self-pay

## 2021-07-17 ENCOUNTER — Telehealth: Payer: Self-pay | Admitting: Pharmacist

## 2021-07-17 NOTE — Telephone Encounter (Signed)
--   Roy Oconnell - Wednesday, July 17, 2021 10:46 AM --[facility] [employer] for refill on Jardiance, they will ship to our office.

## 2021-07-19 ENCOUNTER — Other Ambulatory Visit: Payer: Self-pay

## 2021-07-19 MED FILL — Losartan Potassium Tab 50 MG: ORAL | 90 days supply | Qty: 90 | Fill #1 | Status: AC

## 2021-07-22 ENCOUNTER — Other Ambulatory Visit: Payer: Self-pay

## 2021-07-22 MED FILL — Labetalol HCl Tab 200 MG: ORAL | 30 days supply | Qty: 60 | Fill #4 | Status: AC

## 2021-07-24 ENCOUNTER — Other Ambulatory Visit: Payer: Self-pay

## 2021-08-26 ENCOUNTER — Other Ambulatory Visit: Payer: Self-pay

## 2021-08-26 MED FILL — Labetalol HCl Tab 200 MG: ORAL | 30 days supply | Qty: 60 | Fill #5 | Status: AC

## 2021-08-29 ENCOUNTER — Other Ambulatory Visit: Payer: Self-pay

## 2021-08-30 ENCOUNTER — Other Ambulatory Visit: Payer: Self-pay

## 2021-09-09 ENCOUNTER — Ambulatory Visit: Payer: Medicaid Other

## 2021-09-09 ENCOUNTER — Other Ambulatory Visit: Payer: Self-pay

## 2021-09-09 VITALS — BP 125/80

## 2021-09-09 DIAGNOSIS — Z79899 Other long term (current) drug therapy: Secondary | ICD-10-CM

## 2021-09-09 NOTE — Progress Notes (Signed)
Medication Management Clinic Visit Note  Patient: Roy Oconnell MRN: [id number] Date of Birth: 09-24-1976 PCP: Gennaro Africa, MD   [place] Fult[place] Reek 45 y.o. male presents for a yearly MTM visit today. Patient was identified with two patient identifiers (name and DOB).   BP 125/80   Patient Information   Past Medical History:  Diagnosis Date   Diabetes mellitus without complication ([place])    Hyperlipemia    Hypertension    Renal disorder       Past Surgical History:  Procedure Laterality Date   DIALYSIS/PERMA CATHETER INSERTION N/A 04/07/2019   Procedure: DIALYSIS/PERMA CATHETER INSERTION;  Surgeon: Algernon Huxley, MD;  Location: [facility];  Service: Cardiovascular;  Laterality: N/A;     Family History  Problem Relation Age of Onset   Diabetes Mother    Hypertension Mother    Heart attack Father     New Diagnoses (since last visit):   Family Support: Good  Lifestyle Diet: Breakfast: usually skips Lunch:protein and green vegetable, sometimes packs lunch to work or work provides mac and cheese or cups of noodles  Dinner: lean protein and green beans, TV dinners Drinks:hot teas, juice, soda, water             Social History   Substance and Sexual Activity  Alcohol Use Not Currently      Social History   Tobacco Use  Smoking Status Never  Smokeless Tobacco Never      Health Maintenance  Topic Date Due   COVID-19 Vaccine (1) Never done   Pneumococcal Vaccine 72-4 Years old (1 - PCV) Never done   FOOT EXAM  Never done   OPHTHALMOLOGY EXAM  Never done   TETANUS/TDAP  04/28/2019   HEMOGLOBIN A1C  10/01/2019   INFLUENZA VACCINE  05/20/2021   Hepatitis C Screening  Completed   HIV Screening  Completed   HPV VACCINES  Aged Out   Health Maintenance/Date Completed  Last ED visit: 04/30/2019 Last Visit to PCP: 04/08/2021 Next Visit to PCP: Needs to schedule  Specialist Visit: 02/27/2021 Dental Exam: Last Summer  Eye Exam: 2 years   Prostate Exam: has not received  Pelvic/PAP Exam: N/A Mammogram: N/A DEXA: N/A Colonoscopy: not received  Flu Vaccine: not received  Pneumonia Vaccine: not received  COVID-19 Vaccine: received 1  Shingrix Vaccine: not received   Outpatient Encounter Medications as of 09/09/2021  Medication Sig   amLODipine (NORVASC) 10 MG tablet TAKE ONE TABLET (10MG TOTAL) BY MOUTH ONCE DAILY IN THE MORNING.   atorvastatin (LIPITOR) 20 MG tablet Take 1 tablet (20 mg total) by mouth once daily in the morning.   empagliflozin (JARDIANCE) 10 MG TABS tablet Take 1 tablet (10 mg total) by mouth once daily in the morning.   ibuprofen (ADVIL) 200 MG tablet Take 400 mg by mouth every 6 (six) hours as needed for headache.   Insulin Glargine (BASAGLAR KWIKPEN) 100 UNIT/ML INJECT 15 UNITS UNDER THE SKIN ONCE NIGHTLY.   insulin lispro (HUMALOG) 100 UNIT/ML KwikPen Inject 0.03-0.15 mL (3-15 Units total) under the skin Three (3) times a day before meals. (Sliding scale based on blood sugar values).   Insulin Pen Needle 32G X 4 MM MISC Use as directed   labetalol (NORMODYNE) 200 MG tablet TAKE ONE TABLET BY MOUTH 2 TIMES A DAY   losartan (COZAAR) 50 MG tablet TAKE ONE TABLET BY MOUTH ONCE EVERY DAY.   multivitamin (RENA-VIT) TABS tablet Take 1 tablet by mouth at bedtime.   [  DISCONTINUED] insulin lispro (HUMALOG) 100 UNIT/ML injection Inject 0-0.2 mLs (0-20 Units total) into the skin 4 (four) times daily - after meals and at bedtime. Glucose 150-200-give 3 units subcu Glucose 201-250-give 6units subcu Glucose 251-300-give 9 units subcu Glucose 301-350-give 12 units subcu Glucose 351-400-give 15 units and call primary care provider   [DISCONTINUED] amLODipine (NORVASC) 10 MG tablet Take 1 tablet (10 mg total) by mouth daily. (Patient taking differently: Take 5 mg by mouth daily. )   [DISCONTINUED] aspirin EC 81 MG tablet Take 1 tablet (81 mg total) by mouth daily. (Patient not taking: Reported on 10/31/2019)    [DISCONTINUED] atorvastatin (LIPITOR) 20 MG tablet Take 1 tablet (20 mg total) by mouth daily.   [DISCONTINUED] gabapentin (NEURONTIN) 100 MG capsule Take 1 capsule (100 mg total) by mouth at bedtime. (Patient not taking: Reported on 09/09/2021)   [DISCONTINUED] Insulin Glargine (BASAGLAR KWIKPEN) 100 UNIT/ML SOPN Inject 0.15 mLs (15 Units total) into the skin at bedtime.   [DISCONTINUED] labetalol (NORMODYNE) 200 MG tablet Take 1 tablet (200 mg total) by mouth 2 (two) times daily.   [DISCONTINUED] levETIRAcetam (KEPPRA) 250 MG tablet Take 1 tablet (250 mg total) by mouth 2 (two) times daily.   [DISCONTINUED] levETIRAcetam (KEPPRA) 250 MG tablet TAKE ONE TABLET BY MOUTH 2 TIMES A DAY   No facility-administered encounter medications on file as of 09/09/2021.      Assessment and Plan: Medication adherence: reports good adherence to medications, utilizes a pill box. Is able to recall names, indications, and doses.  HTN --Currently receiving amlodipine, labetalol, and losartan and reports no issues  --Check twice weekly, average systolic 881'J in the evening --Provided education on appropriate timing of blood pressure monitoring  DM --Currently receiving empagliflozin, 15 units glargine, and Humalog sliding scale  --Check sugars three times daily, averages 120-130's --Last A1c 8.8 in March 2022 HLD --Currently receiving atorvastatin and reports no issues  Kidney disease  --Currently receiving Rena-Vit and reports no issues --Patient has close follow-up with nephrologist, with next appointment in December   Patient was encouraged to continue to monitor glucose and blood pressure at home. Patient was provided with education about monitoring blood pressure in the morning. Patient was encouraged to continue to make dietary changes and incorporate fruits and more vegetables into diet. Patient was agreeable to all suggestions. Plan to return to clinic in 1 year for annual MTM.  RTC: 1  year  Narda Rutherford, PharmD Pharmacy Resident  09/09/2021 9:44 AM

## 2021-09-26 ENCOUNTER — Other Ambulatory Visit: Payer: Self-pay

## 2021-09-30 ENCOUNTER — Other Ambulatory Visit: Payer: Self-pay

## 2021-09-30 MED FILL — Labetalol HCl Tab 200 MG: ORAL | 30 days supply | Qty: 60 | Fill #6 | Status: AC

## 2021-10-07 ENCOUNTER — Other Ambulatory Visit: Payer: Self-pay

## 2021-10-29 ENCOUNTER — Other Ambulatory Visit: Payer: Self-pay

## 2021-11-04 ENCOUNTER — Other Ambulatory Visit: Payer: Self-pay

## 2021-11-04 MED FILL — Labetalol HCl Tab 200 MG: ORAL | 30 days supply | Qty: 60 | Fill #7 | Status: AC

## 2021-11-14 ENCOUNTER — Other Ambulatory Visit: Payer: Self-pay

## 2021-11-18 MED FILL — Losartan Potassium Tab 50 MG: ORAL | 90 days supply | Qty: 90 | Fill #2 | Status: AC

## 2021-11-19 ENCOUNTER — Other Ambulatory Visit: Payer: Self-pay

## 2021-11-27 ENCOUNTER — Other Ambulatory Visit: Payer: Self-pay

## 2021-11-28 ENCOUNTER — Other Ambulatory Visit: Payer: Self-pay

## 2021-11-28 MED ORDER — AMLODIPINE BESYLATE 10 MG PO TABS
ORAL_TABLET | ORAL | 11 refills | Status: AC
  Filled 2021-11-28: qty 90, 90d supply, fill #0
  Filled 2022-02-24: qty 90, 90d supply, fill #1

## 2021-11-29 ENCOUNTER — Other Ambulatory Visit: Payer: Self-pay

## 2021-12-02 ENCOUNTER — Other Ambulatory Visit: Payer: Self-pay

## 2021-12-02 MED FILL — Labetalol HCl Tab 200 MG: ORAL | 30 days supply | Qty: 60 | Fill #8 | Status: AC

## 2021-12-31 ENCOUNTER — Other Ambulatory Visit: Payer: Self-pay

## 2021-12-31 MED FILL — Labetalol HCl Tab 200 MG: ORAL | 20 days supply | Qty: 40 | Fill #9 | Status: AC

## 2022-01-21 ENCOUNTER — Other Ambulatory Visit: Payer: Self-pay

## 2022-01-21 MED ORDER — ATORVASTATIN CALCIUM 20 MG PO TABS
ORAL_TABLET | Freq: Every day | ORAL | 1 refills | Status: AC
  Filled 2022-01-21: qty 30, 30d supply, fill #0
  Filled 2022-02-24: qty 90, 90d supply, fill #1

## 2022-01-22 ENCOUNTER — Other Ambulatory Visit: Payer: Self-pay

## 2022-01-30 ENCOUNTER — Other Ambulatory Visit: Payer: Self-pay

## 2022-01-30 MED ORDER — LABETALOL HCL 200 MG PO TABS
ORAL_TABLET | Freq: Two times a day (BID) | ORAL | 3 refills | Status: AC
  Filled 2022-01-30: qty 60, 30d supply, fill #0
  Filled 2022-03-10: qty 60, 30d supply, fill #1
  Filled 2022-03-11: qty 60, 30d supply, fill #0
  Filled 2022-04-16: qty 60, 30d supply, fill #1

## 2022-02-03 ENCOUNTER — Other Ambulatory Visit: Payer: Self-pay

## 2022-02-04 ENCOUNTER — Other Ambulatory Visit: Payer: Self-pay

## 2022-02-07 ENCOUNTER — Other Ambulatory Visit: Payer: Self-pay

## 2022-02-10 ENCOUNTER — Telehealth: Payer: Self-pay | Admitting: Pharmacist

## 2022-02-10 NOTE — Telephone Encounter (Signed)
02/10/2022 3:40:57 PM - Vania Rea refill online to [employer] ?-- Arletha Pili - Monday, February 10, 2022 3:27 PM --[employer] online refill for [employer]. ?

## 2022-02-12 ENCOUNTER — Other Ambulatory Visit: Payer: Self-pay

## 2022-02-14 ENCOUNTER — Other Ambulatory Visit: Payer: Self-pay

## 2022-02-17 ENCOUNTER — Telehealth: Payer: Self-pay | Admitting: Pharmacy Technician

## 2022-02-17 NOTE — Telephone Encounter (Signed)
Patient still needs to provide 2022 Federal Tax Return. ? ?Roy Oconnell ?Care Manager ?[facility] ?

## 2022-02-18 ENCOUNTER — Other Ambulatory Visit: Payer: Self-pay

## 2022-02-18 MED ORDER — LOSARTAN POTASSIUM 50 MG PO TABS
50.0000 mg | ORAL_TABLET | Freq: Every day | ORAL | 3 refills | Status: AC
  Filled 2022-02-18: qty 90, 90d supply, fill #0

## 2022-02-19 ENCOUNTER — Other Ambulatory Visit: Payer: Self-pay

## 2022-02-21 ENCOUNTER — Other Ambulatory Visit: Payer: Self-pay

## 2022-02-24 ENCOUNTER — Other Ambulatory Visit: Payer: Self-pay

## 2022-02-25 ENCOUNTER — Other Ambulatory Visit: Payer: Self-pay

## 2022-02-27 ENCOUNTER — Other Ambulatory Visit: Payer: Self-pay

## 2022-03-13 ENCOUNTER — Other Ambulatory Visit: Payer: Self-pay

## 2022-03-14 ENCOUNTER — Telehealth: Payer: Self-pay | Admitting: Pharmacy Technician

## 2022-03-14 NOTE — Telephone Encounter (Signed)
Received 2022 Federal Tax Return.  Jacquelynn Cree Patient Advocate Specialist [place] at [facility]

## 2022-04-07 ENCOUNTER — Other Ambulatory Visit: Payer: Self-pay

## 2022-04-17 ENCOUNTER — Other Ambulatory Visit: Payer: Self-pay

## 2022-04-23 ENCOUNTER — Other Ambulatory Visit: Payer: Self-pay

## 2022-04-24 ENCOUNTER — Other Ambulatory Visit: Payer: Self-pay

## 2023-03-06 ENCOUNTER — Other Ambulatory Visit: Payer: Self-pay
